# Patient Record
Sex: Female | Born: 1970 | Race: White | Hispanic: No | Marital: Married | Smoking: Former smoker
Health system: Southern US, Community
[De-identification: ages and names within clinical notes are randomized; demographics above are authoritative.]

## PROBLEM LIST (undated history)

## (undated) DIAGNOSIS — F32A Depression, unspecified: Secondary | ICD-10-CM

## (undated) DIAGNOSIS — F329 Major depressive disorder, single episode, unspecified: Secondary | ICD-10-CM

## (undated) DIAGNOSIS — A64 Unspecified sexually transmitted disease: Secondary | ICD-10-CM

## (undated) DIAGNOSIS — N946 Dysmenorrhea, unspecified: Secondary | ICD-10-CM

## (undated) DIAGNOSIS — R87619 Unspecified abnormal cytological findings in specimens from cervix uteri: Secondary | ICD-10-CM

## (undated) DIAGNOSIS — K279 Peptic ulcer, site unspecified, unspecified as acute or chronic, without hemorrhage or perforation: Secondary | ICD-10-CM

## (undated) HISTORY — DX: Depression, unspecified: F32.A

## (undated) HISTORY — DX: Major depressive disorder, single episode, unspecified: F32.9

## (undated) HISTORY — DX: Dysmenorrhea, unspecified: N94.6

## (undated) HISTORY — DX: Unspecified sexually transmitted disease: A64

## (undated) HISTORY — DX: Unspecified abnormal cytological findings in specimens from cervix uteri: R87.619

## (undated) HISTORY — DX: Peptic ulcer, site unspecified, unspecified as acute or chronic, without hemorrhage or perforation: K27.9

---

## 1998-07-16 ENCOUNTER — Other Ambulatory Visit: Admission: RE | Admit: 1998-07-16 | Discharge: 1998-07-16 | Payer: Self-pay | Admitting: *Deleted

## 1998-10-07 ENCOUNTER — Ambulatory Visit (HOSPITAL_COMMUNITY): Admission: RE | Admit: 1998-10-07 | Discharge: 1998-10-07 | Payer: Self-pay | Admitting: *Deleted

## 1999-07-14 ENCOUNTER — Other Ambulatory Visit: Admission: RE | Admit: 1999-07-14 | Discharge: 1999-07-14 | Payer: Self-pay | Admitting: Obstetrics and Gynecology

## 2000-04-10 ENCOUNTER — Emergency Department (HOSPITAL_COMMUNITY): Admission: EM | Admit: 2000-04-10 | Discharge: 2000-04-10 | Payer: Self-pay

## 2000-08-20 ENCOUNTER — Other Ambulatory Visit: Admission: RE | Admit: 2000-08-20 | Discharge: 2000-08-20 | Payer: Self-pay | Admitting: Obstetrics and Gynecology

## 2001-07-20 ENCOUNTER — Emergency Department (HOSPITAL_COMMUNITY): Admission: EM | Admit: 2001-07-20 | Discharge: 2001-07-20 | Payer: Self-pay | Admitting: Emergency Medicine

## 2001-07-20 ENCOUNTER — Encounter: Payer: Self-pay | Admitting: Emergency Medicine

## 2001-07-24 ENCOUNTER — Ambulatory Visit (HOSPITAL_COMMUNITY): Admission: RE | Admit: 2001-07-24 | Discharge: 2001-07-24 | Payer: Self-pay | Admitting: Emergency Medicine

## 2001-07-24 ENCOUNTER — Encounter: Payer: Self-pay | Admitting: Emergency Medicine

## 2001-07-28 ENCOUNTER — Emergency Department (HOSPITAL_COMMUNITY): Admission: EM | Admit: 2001-07-28 | Discharge: 2001-07-28 | Payer: Self-pay | Admitting: *Deleted

## 2001-10-05 ENCOUNTER — Other Ambulatory Visit: Admission: RE | Admit: 2001-10-05 | Discharge: 2001-10-05 | Payer: Self-pay | Admitting: *Deleted

## 2002-10-09 ENCOUNTER — Other Ambulatory Visit: Admission: RE | Admit: 2002-10-09 | Discharge: 2002-10-09 | Payer: Self-pay | Admitting: *Deleted

## 2004-05-08 ENCOUNTER — Other Ambulatory Visit: Admission: RE | Admit: 2004-05-08 | Discharge: 2004-05-08 | Payer: Self-pay | Admitting: *Deleted

## 2005-08-25 ENCOUNTER — Other Ambulatory Visit: Admission: RE | Admit: 2005-08-25 | Discharge: 2005-08-25 | Payer: Self-pay | Admitting: Obstetrics & Gynecology

## 2006-01-12 ENCOUNTER — Other Ambulatory Visit: Admission: RE | Admit: 2006-01-12 | Discharge: 2006-01-12 | Payer: Self-pay | Admitting: Obstetrics & Gynecology

## 2006-03-20 ENCOUNTER — Emergency Department (HOSPITAL_COMMUNITY): Admission: EM | Admit: 2006-03-20 | Discharge: 2006-03-20 | Payer: Self-pay | Admitting: *Deleted

## 2007-03-03 ENCOUNTER — Other Ambulatory Visit: Admission: RE | Admit: 2007-03-03 | Discharge: 2007-03-03 | Payer: Self-pay | Admitting: Obstetrics & Gynecology

## 2008-10-18 ENCOUNTER — Other Ambulatory Visit: Admission: RE | Admit: 2008-10-18 | Discharge: 2008-10-18 | Payer: Self-pay | Admitting: Obstetrics and Gynecology

## 2011-10-19 ENCOUNTER — Ambulatory Visit (INDEPENDENT_AMBULATORY_CARE_PROVIDER_SITE_OTHER): Payer: 59 | Admitting: Physician Assistant

## 2011-10-19 VITALS — BP 115/79 | HR 93 | Temp 98.1°F | Resp 16 | Ht 68.5 in | Wt 186.0 lb

## 2011-10-19 DIAGNOSIS — B349 Viral infection, unspecified: Secondary | ICD-10-CM

## 2011-10-19 DIAGNOSIS — B9789 Other viral agents as the cause of diseases classified elsewhere: Secondary | ICD-10-CM

## 2011-10-19 MED ORDER — IPRATROPIUM BROMIDE 0.06 % NA SOLN: 2.0000 | Freq: Four times a day (QID) | NASAL | Status: AC

## 2011-10-19 NOTE — Progress Notes (Signed)
SUBJECTIVE:  Kristie Baldwin is a 41 y.o. female who complains of congestion, sneezing, sore throat, dry cough, myalgias, headache and fever for 4 days. She denies a history of shortness of breath.  Patient denies smoke cigarettes.   OBJECTIVE: She appears well, vital signs are as noted. Ears normal.  Throat and pharynx normal.  Neck supple. No adenopathy in the neck. Nose is congested. Sinuses non tender. The chest is clear, without wheezes or rales.  ASSESSMENT:  viral upper respiratory illness  PLAN: Symptomatic therapy suggested: Atrovent NS, push fluids, rest, gargle warm salt water, use cough suppressant of choice prn and return office visit prn if symptoms persist or worsen. Lack of antibiotic effectiveness discussed with her. Call or return to clinic  prn if these symptoms worsen or fail to improve as anticipated.

## 2011-10-19 NOTE — Patient Instructions (Addendum)
Make an appointment with a dermatologist of your choice   [facility] [phone]  Dr. Yetta Barre [phone]  Dr. Elmon Else [phone]

## 2012-05-02 ENCOUNTER — Ambulatory Visit (INDEPENDENT_AMBULATORY_CARE_PROVIDER_SITE_OTHER): Payer: 59 | Admitting: Family Medicine

## 2012-05-02 VITALS — BP 122/76 | HR 78 | Temp 98.0°F | Resp 16 | Ht 68.0 in | Wt 205.0 lb

## 2012-05-02 DIAGNOSIS — M7989 Other specified soft tissue disorders: Secondary | ICD-10-CM

## 2012-05-02 NOTE — Progress Notes (Signed)
[facility] [address], [place] [address] [phone]  Date:  05/02/2012   Name:  Kristie Baldwin   DOB:  1971-02-25   MRN:  [id number]  PCP:  No primary provider on file.    Chief Complaint: Foot Swelling and Joint Swelling   History of Present Illness:  Kristie Baldwin is a 41 y.o. very pleasant female patient who presents with the following:  She went to [place] last week- they drove down to [place] and then took a 3 hour flight to [place].  She stayed at an all- inclusive resort and ate and drank much differently than she is used to.  About 48 hours after arrival she noted that both of her feet and lower legs were swollen.  She does not have any calf pain.  She has never been a smoker, does not use OCP, and has never had a DVT or PE.  No SOB or chest pain.  She has noted that her swelling is better over the last 2 days, but she thought that she should have it checked out.  Otherwise she feels fine and is generally healthy  There is no problem list on file for this patient.   No past medical history on file.  No past surgical history on file.  History  Substance Use Topics  . Smoking status: Never Smoker   . Smokeless tobacco: Not on file  . Alcohol Use: Not on file    No family history on file.  No Known Allergies  Medication list has been reviewed and updated.  Current Outpatient Prescriptions on File Prior to Visit  Medication Sig Dispense Refill  . ipratropium (ATROVENT) 0.06 % nasal spray Place 2 sprays into the nose 4 (four) times daily.  15 mL  12    Review of Systems:  As per HPI- otherwise negative.   Physical Examination: Filed Vitals:   05/02/12 1011  BP: 122/76  Pulse: 78  Temp: 98 F (36.7 C)  Resp: 16   Filed Vitals:   05/02/12 1011  Height: 5\' 8"  (1.727 m)  Weight: 205 lb (92.987 kg)   Body mass index is 31.17 kg/(m^2). Ideal Body Weight: Weight in (lb) to have BMI = 25: 164.1   GEN: WDWN, NAD,  Non-toxic, A & O x 3, overweight HEENT: Atraumatic, Normocephalic. Neck supple. No masses, No LAD.  PEERL, EOMI, oropharynx wnl Ears and Nose: No external deformity. CV: RRR, No M/G/R. No JVD. No thrill. No extra heart sounds. PULM: CTA B, no wheezes, crackles, rhonchi. No retractions. No resp. distress. No accessory muscle use. EXTR: No c/c.  Very minimal edema of both feet, no pitting, no calf swelling or tenderness. NEURO Normal gait.  PSYCH: Normally interactive. Conversant. Not depressed or anxious appearing.  Calm demeanor.    Assessment and Plan: 1. Foot swelling    Suspect that Kristie Baldwin developed leg edema due to a long car ride/ flight and then dietary indiscretion.  She is now better, and has minimal swelling.  She does not have calf pain or swelling, and her symptoms are present bilaterally which makes a DVT much more likely.  Discussed a doppler but Kristie Baldwin declined this for now. She will drink plenty of fluids, elevate her legs and watch her salt intake.  If she is not continuing to improve please let me know- Sooner if worse.     Abbe Amsterdam, MD

## 2012-08-29 LAB — HM PAP SMEAR

## 2012-12-09 ENCOUNTER — Telehealth: Payer: Self-pay | Admitting: Nurse Practitioner

## 2012-12-09 NOTE — Telephone Encounter (Signed)
Pt canceled appointment for labs Pt states she stopped taking the medication months ago so this lab is not needed.

## 2012-12-12 ENCOUNTER — Other Ambulatory Visit: Payer: 59

## 2013-03-08 ENCOUNTER — Telehealth: Payer: Self-pay | Admitting: Nurse Practitioner

## 2013-03-08 NOTE — Telephone Encounter (Signed)
Patient says she has "something weird going on" and wants to talk to a nurse.

## 2013-03-08 NOTE — Telephone Encounter (Signed)
Patient states went camping last week and had to use a different soap. She is now with severe vaginal itching , red, swollen in area.  Suggested OTC hydrocortisone ointment for discomfort along with sitz bath with Aveeno and let air dry . Patient given appt. With Shirlyn Goltz , Thursday, 03/09/2013 @ 10:15am.

## 2013-03-09 ENCOUNTER — Ambulatory Visit (INDEPENDENT_AMBULATORY_CARE_PROVIDER_SITE_OTHER): Payer: BC Managed Care – PPO | Admitting: Nurse Practitioner

## 2013-03-09 ENCOUNTER — Encounter: Payer: Self-pay | Admitting: Nurse Practitioner

## 2013-03-09 VITALS — BP 120/72 | HR 74 | Resp 12 | Ht 67.75 in | Wt 201.8 lb

## 2013-03-09 DIAGNOSIS — N766 Ulceration of vulva: Secondary | ICD-10-CM

## 2013-03-09 MED ORDER — VALACYCLOVIR HCL 1 G PO TABS: 1000.0000 mg | ORAL_TABLET | Freq: Two times a day (BID) | ORAL | Status: DC

## 2013-03-09 MED ORDER — FLUCONAZOLE 150 MG PO TABS: 150.0000 mg | ORAL_TABLET | Freq: Once | ORAL | Status: DC

## 2013-03-09 NOTE — Progress Notes (Signed)
Encounter reviewed by Dr. Brook Silva.  

## 2013-03-09 NOTE — Patient Instructions (Addendum)
Monilial Vaginitis Vaginitis in a soreness, swelling and redness (inflammation) of the vagina and vulva. Monilial vaginitis is not a sexually transmitted infection. CAUSES  Yeast vaginitis is caused by yeast (candida) that is normally found in your vagina. With a yeast infection, the candida has overgrown in number to a point that upsets the chemical balance. SYMPTOMS   White, thick vaginal discharge.  Swelling, itching, redness and irritation of the vagina and possibly the lips of the vagina (vulva).  Burning or painful urination.  Painful intercourse. DIAGNOSIS  Things that may contribute to monilial vaginitis are:  Postmenopausal and virginal states.  Pregnancy.  Infections.  Being tired, sick or stressed, especially if you had monilial vaginitis in the past.  Diabetes. Good control will help lower the chance.  Birth control pills.  Tight fitting garments.  Using bubble bath, feminine sprays, douches or deodorant tampons.  Taking certain medications that kill germs (antibiotics).  Sporadic recurrence can occur if you become ill. TREATMENT  Your caregiver will give you medication.  There are several kinds of anti monilial vaginal creams and suppositories specific for monilial vaginitis. For recurrent yeast infections, use a suppository or cream in the vagina 2 times a week, or as directed.  Anti-monilial or steroid cream for the itching or irritation of the vulva may also be used. Get your caregiver's permission.  Painting the vagina with methylene blue solution may help if the monilial cream does not work.  Eating yogurt may help prevent monilial vaginitis. HOME CARE INSTRUCTIONS   Finish all medication as prescribed.  Do not have sex until treatment is completed or after your caregiver tells you it is okay.  Take warm sitz baths.  Do not douche.  Do not use tampons, especially scented ones.  Wear cotton underwear.  Avoid tight pants and panty  hose.  Tell your sexual partner that you have a yeast infection. They should go to their caregiver if they have symptoms such as mild rash or itching.  Your sexual partner should be treated as well if your infection is difficult to eliminate.  Practice safer sex. Use condoms.  Some vaginal medications cause latex condoms to fail. Vaginal medications that harm condoms are:  Cleocin cream.  Butoconazole (Femstat).  Terconazole (Terazol) vaginal suppository.  Miconazole (Monistat) (may be purchased over the counter). SEEK MEDICAL CARE IF:   You have a temperature by mouth above 102 F (38.9 C).  The infection is getting worse after 2 days of treatment.  The infection is not getting better after 3 days of treatment.  You develop blisters in or around your vagina.  You develop vaginal bleeding, and it is not your menstrual period.  You have pain when you urinate.  You develop intestinal problems.  You have pain with sexual intercourse. Document Released: 05/06/2005 Document Revised: 10/19/2011 Document Reviewed: 01/18/2009 [facility] Patient Information 2014 [place], [place].    Herpes Labialis You have a fever blister or cold sore (herpes labialis). These painful, grouped sores are caused by one of the herpes viruses (HSV1 most commonly). They are usually found around the lips and mouth, but the same infection can also affect other areas on the face such as the nose and eyes. Herpes infections take about 10 days to heal. They often occur again and again in the same spot. Other symptoms may include numbness and tingling in the involved skin, achiness, fever, and swollen glands in the neck. Colds, emotional stress, injuries, or excess sunlight exposure all seem to make herpes reappear.  Herpes lip infections are contagious. Direct contact with these sores can spread the infection. It can also be spread to other parts of your own body. TREATMENT  Herpes labialis is usually  self-limited and resolves within 1 week. To reduce pain and swelling, apply ice packs frequently to the sores or suck on popsicles or frozen juice bars. Antiviral medicine may be used by mouth to shorten the duration of the breakout. Avoid spreading the infection by washing your hands often. Be careful not to touch your eyes or genital areas after handling the infected blisters. Do not kiss or have other intimate contact with others. After the blisters are completely healed you may resume contact. Use sunscreen to lessen recurrences.  If this is your first infection with herpes, or if you have a severe or repeated infections, your caregiver may prescribe one of the anti-viral drugs to speed up the healing. If you have sun-related flare-ups despite the use of sunscreen, starting oral anti-viral medicine before a prolonged exposure (going skiing or to the beach) can prevent most episodes.  SEEK IMMEDIATE MEDICAL CARE IF:  You develop a headache, sleepiness, high fever, vomiting, or severe weakness.  You have eye irritation, pain, blurred vision or redness.  You develop a prolonged infection not getting better in 10 days. Document Released: 07/27/2005 Document Revised: 10/19/2011 Document Reviewed: 05/31/2009 [facility] Patient Information 2014 [place], [place].

## 2013-03-09 NOTE — Progress Notes (Signed)
Subjective:     Patient ID: Kristie Baldwin, female   DOB: 15-Oct-1970, 42 y.o.   MRN: [id number]  42 yo WS Fe presents with vaginal irritation since Monday. New soap with Lavender scented and right away thought she got a yeast infection or local reaction.  Also recent camping trip for 5 days and unable to bathe very well. No bladder symptoms.  Most symptoms are external with swelling, itching and burning. Last SA 2 weeks ago without change in partner. They still are planning wedding in March 2015. No history of HSV.  Vaginal Pain The patient's pertinent negatives include no pelvic pain or vaginal discharge. Pertinent negatives include no abdominal pain, chills, constipation, diarrhea, dysuria, fever, flank pain, frequency, hematuria, nausea, urgency or vomiting.      Review of Systems  Constitutional: Negative for fever, chills and fatigue.  Respiratory: Negative.   Cardiovascular: Negative.   Gastrointestinal: Negative.  Negative for nausea, vomiting, abdominal pain, diarrhea, constipation, blood in stool and abdominal distention.  Genitourinary: Positive for vaginal pain. Negative for dysuria, urgency, frequency, hematuria, flank pain, vaginal bleeding, vaginal discharge, difficulty urinating, pelvic pain and dyspareunia.       LMP 7/43/14 heavier and longer than usual lasting 6 days. Condoms for birth control.  History of hydradenitis in groin areas.    Musculoskeletal: Negative.   Neurological: Negative.   Psychiatric/Behavioral: Negative.        A lot of emotional stressors with loss of job and sending out resume'.       Objective:   Physical Exam  Constitutional: She is oriented to person, place, and time. She appears well-developed and well-nourished.  Pulmonary/Chest: Effort normal.  Abdominal: Soft. She exhibits no distension and no mass. There is no tenderness. There is no rebound and no guarding.  Genitourinary:     Right vulva lesion ? HSV or scratching.  HSV culture is  obtained.  Several other areas peri anal with redness and irritation consistent with yeast. No vaginal discharge.  Neurological: She is alert and oriented to person, place, and time.  Psychiatric: She has a normal mood and affect. Her behavior is normal. Judgment and thought content normal.       Assessment:     Vulvar lesions R/O HSV doubtful Yeast vulva changes consistent with change of soaps and maybe allergic reaction.    Plan:    Valtrex 1 gm bid for 10 days Diflucan 150 mg X 2  Will call her with culture results

## 2013-03-13 LAB — HERPES SIMPLEX VIRUS CULTURE: Organism ID, Bacteria: DETECTED

## 2013-03-14 ENCOUNTER — Telehealth: Payer: Self-pay | Admitting: Nurse Practitioner

## 2013-03-14 NOTE — Telephone Encounter (Signed)
Called and left message for patient to call me back with the best number to discuss her lab test results this afternoon.

## 2013-03-14 NOTE — Telephone Encounter (Signed)
Patient returning your call for test results. °

## 2013-03-15 ENCOUNTER — Telehealth: Payer: Self-pay | Admitting: Nurse Practitioner

## 2013-03-15 NOTE — Telephone Encounter (Signed)
Left patient another message to call back about test results.

## 2013-03-16 ENCOUNTER — Telehealth: Payer: Self-pay | Admitting: Nurse Practitioner

## 2013-03-16 NOTE — Telephone Encounter (Signed)
Patient return phone call. She said Kristie Baldwin had called her last night and they have missed each others phone call for two days now.

## 2013-03-16 NOTE — Telephone Encounter (Signed)
Patient states she is at home all day today and will be able to receive Shirlyn Goltz call.

## 2013-03-16 NOTE — Telephone Encounter (Signed)
Patients first husband in 1990's had an episode of vaginal problems.  No positive culture in the past.  She believes this is an old infection but never culture proven.  Her symptoms of the outbreak are better but plans on completed antiviral treatment.  Some white discharge and will take second Diflucan within a few days.

## 2013-06-15 ENCOUNTER — Other Ambulatory Visit: Payer: Self-pay

## 2013-07-03 ENCOUNTER — Telehealth: Payer: Self-pay | Admitting: Nurse Practitioner

## 2013-07-03 MED ORDER — VALACYCLOVIR HCL 1 G PO TABS: ORAL_TABLET | ORAL | Status: DC

## 2013-07-03 NOTE — Telephone Encounter (Signed)
Pt would like a Rx sent to pharmacy for the herpes medication that she was prescribed in July.  [facility] [address] at [phone].

## 2013-07-03 NOTE — Telephone Encounter (Signed)
Ms. Kristie Baldwin patient was seen 03/09/13 for OV Valtrx #[id number] was sent.  Please advise.

## 2013-07-05 MED ORDER — VALACYCLOVIR HCL 1 G PO TABS: ORAL_TABLET | ORAL | Status: DC

## 2013-07-05 NOTE — Telephone Encounter (Signed)
Dr. Hyacinth Meeker please advise routed this to Ms.Patty no response.

## 2013-07-05 NOTE — Addendum Note (Signed)
Addended by: Jerene Bears on: 07/05/2013 01:27 PM   Modules accepted: Orders

## 2013-07-05 NOTE — Telephone Encounter (Signed)
LM that rx has been sent

## 2013-07-05 NOTE — Telephone Encounter (Signed)
Rx to pharmacy.  Please let pt know.

## 2013-08-31 ENCOUNTER — Encounter: Payer: Self-pay | Admitting: Nurse Practitioner

## 2013-09-01 ENCOUNTER — Ambulatory Visit (INDEPENDENT_AMBULATORY_CARE_PROVIDER_SITE_OTHER): Payer: BC Managed Care – PPO | Admitting: Nurse Practitioner

## 2013-09-01 ENCOUNTER — Encounter: Payer: Self-pay | Admitting: Nurse Practitioner

## 2013-09-01 VITALS — BP 112/66 | HR 76 | Ht 67.5 in | Wt 203.0 lb

## 2013-09-01 DIAGNOSIS — Z01419 Encounter for gynecological examination (general) (routine) without abnormal findings: Secondary | ICD-10-CM

## 2013-09-01 DIAGNOSIS — Z0000 Encounter for general adult medical examination without abnormal findings: Secondary | ICD-10-CM

## 2013-09-01 DIAGNOSIS — B009 Herpesviral infection, unspecified: Secondary | ICD-10-CM

## 2013-09-01 DIAGNOSIS — A609 Anogenital herpesviral infection, unspecified: Secondary | ICD-10-CM

## 2013-09-01 DIAGNOSIS — L732 Hidradenitis suppurativa: Secondary | ICD-10-CM

## 2013-09-01 LAB — POCT URINALYSIS DIPSTICK
Bilirubin, UA: NEGATIVE
Blood, UA: NEGATIVE
Glucose, UA: NEGATIVE
Ketones, UA: NEGATIVE
Leukocytes, UA: NEGATIVE
Nitrite, UA: NEGATIVE
Protein, UA: NEGATIVE
Urobilinogen, UA: NEGATIVE
pH, UA: 5

## 2013-09-01 LAB — HEMOGLOBIN, FINGERSTICK: Hemoglobin, fingerstick: 12.3 g/dL (ref 12.0–16.0)

## 2013-09-01 MED ORDER — CEPHALEXIN 500 MG PO CAPS: 500.0000 mg | ORAL_CAPSULE | Freq: Two times a day (BID) | ORAL | Status: DC

## 2013-09-01 MED ORDER — NITROFURANTOIN MONOHYD MACRO 100 MG PO CAPS: ORAL_CAPSULE | ORAL | Status: DC

## 2013-09-01 MED ORDER — VALACYCLOVIR HCL 1 G PO TABS: ORAL_TABLET | ORAL | Status: DC

## 2013-09-01 NOTE — Progress Notes (Signed)
Patient ID: Kristie Baldwin, female   DOB: [birth year], 43 y.o.   MRN: [id number] 43 y.o. [id number] Divorced Caucasian Fe here for annual exam. To be married 10/28/13.  Same partner for 4 years.  She has had a stressful year with being out of work.  Now a new job to start in Feb. Also one dog passed away and another dog is older and expected to pass. Menses at 27 - 29 days, lasting 3 days. Flow is moderate to light. Some cramps avery 2-3 months.  More emotional symptoms. Using condoms prn for birth control. Takes Valtrex as prn this past year if flare.  Today she noted the area on right mons pubis being more tender and starting to have minimal discharge.  Patient's last menstrual period was 08/14/2013.          Sexually active: yes  The current method of family planning is condoms sometimes.    Exercising: no  The patient does not participate in regular exercise at present. Smoker:  no  Health Maintenance: Pap:  08/29/12, WNL MMG:  Never will schedule after new job is started. TDaP:  08/27/11 Labs:  HB:  12.3 Urine:  Negative, pH 5.0   reports that she quit smoking about 22 years ago. She has never used smokeless tobacco. She reports that she drinks about 2.0 ounces of alcohol per week. She reports that she does not use illicit drugs.  Past Medical History  Diagnosis Date  . Depression   . STD (sexually transmitted disease)     HSV  . Dysmenorrhea     History reviewed. No pertinent past surgical history.  Current Outpatient Prescriptions  Medication Sig Dispense Refill  . valACYclovir (VALTREX) 1000 MG tablet Take 1/2 tablet daily and if flare 1/2 tablet twice daily for 3 days  30 tablet  6  . cephALEXin (KEFLEX) 500 MG capsule Take 1 capsule (500 mg total) by mouth 2 (two) times daily. Take QID for 7 days.  14 capsule  0  . nitrofurantoin, macrocrystal-monohydrate, (MACROBID) 100 MG capsule 1 cap PC prn  30 capsule  3   No current facility-administered medications for this visit.     Family History  Problem Relation Age of Onset  . Hypertension Mother     ROS:  Pertinent items are noted in HPI.  Otherwise, a comprehensive ROS was negative.  Exam:   BP 112/66  Pulse 76  Ht 5' 7.5" (1.715 m)  Wt 203 lb (92.08 kg)  BMI 31.31 kg/m2  LMP 08/14/2013 Height: 5' 7.5" (171.5 cm)  Ht Readings from Last 3 Encounters:  09/01/13 5' 7.5" (1.715 m)  03/09/13 5' 7.75" (1.721 m)  05/02/12 5\' 8"  (1.727 m)    General appearance: alert, cooperative and appears stated age Head: Normocephalic, without obvious abnormality, atraumatic Neck: no adenopathy, supple, symmetrical, trachea midline and thyroid normal to inspection and palpation Lungs: clear to auscultation bilaterally Breasts: normal appearance, no masses or tenderness Heart: regular rate and rhythm Abdomen: soft, non-tender; no masses,  no organomegaly Extremities: extremities normal, atraumatic, no cyanosis or edema Skin: Skin color, texture, turgor normal. No rashes or lesions Lymph nodes: Cervical, supraclavicular, and axillary nodes normal. No abnormal inguinal nodes palpated Neurologic: Grossly normal   Pelvic: External genitalia:   Lesion with cyst recurrent area right  Mons pubis.              Urethra:  normal appearing urethra with no masses, tenderness or lesions  Bartholin's and Skene's: normal                 Vagina: normal appearing vagina with normal color and discharge, no lesions              Cervix: anteverted              Pap taken: no Bimanual Exam:  Uterus:  normal size, contour, position, consistency, mobility, non-tender              Adnexa: no mass, fullness, tenderness               Rectovaginal: Confirms               Anus:  normal sphincter tone, no lesions  A:  Well Woman with normal exam  History of HSV  History of PC UTI  History of hydradenitis   P:   Pap smear as per guidelines   Mammogram to be scheduled after new job starts  Refill Macrobid to use prn  PC  Refill Keflex 500 mg in case current area of cyst worsens  Does not need a refill at this time on Valtrex.  Counseled on breast self exam, mammography screening, adequate intake of calcium and vitamin D, diet and exercise return annually or prn  An After Visit Summary was printed and given to the patient.

## 2013-09-01 NOTE — Patient Instructions (Signed)

## 2013-09-06 NOTE — Progress Notes (Signed)
Reviewed personally.  M. Suzanne Li Bobo, MD.  

## 2013-11-13 ENCOUNTER — Ambulatory Visit: Payer: BC Managed Care – PPO

## 2013-11-13 ENCOUNTER — Ambulatory Visit (INDEPENDENT_AMBULATORY_CARE_PROVIDER_SITE_OTHER): Payer: BC Managed Care – PPO | Admitting: Family Medicine

## 2013-11-13 VITALS — BP 104/82 | HR 89 | Temp 98.4°F | Resp 18 | Ht 68.0 in | Wt 203.0 lb

## 2013-11-13 DIAGNOSIS — R0602 Shortness of breath: Secondary | ICD-10-CM

## 2013-11-13 DIAGNOSIS — K219 Gastro-esophageal reflux disease without esophagitis: Secondary | ICD-10-CM

## 2013-11-13 DIAGNOSIS — R5381 Other malaise: Secondary | ICD-10-CM

## 2013-11-13 DIAGNOSIS — R5383 Other fatigue: Secondary | ICD-10-CM

## 2013-11-13 DIAGNOSIS — D649 Anemia, unspecified: Secondary | ICD-10-CM

## 2013-11-13 LAB — POCT CBC
Granulocyte percent: 74 %G (ref 37–80)
HCT, POC: 34.2 % — AB (ref 37.7–47.9)
Hemoglobin: 10.6 g/dL — AB (ref 12.2–16.2)
Lymph, poc: 2.2 (ref 0.6–3.4)
MCH, POC: 27 pg (ref 27–31.2)
MCHC: 31 g/dL — AB (ref 31.8–35.4)
MCV: 87.3 fL (ref 80–97)
MID (cbc): 0.6 (ref 0–0.9)
MPV: 9.3 fL (ref 0–99.8)
POC Granulocyte: 7.7 — AB (ref 2–6.9)
POC LYMPH PERCENT: 20.7 %L (ref 10–50)
POC MID %: 5.3 %M (ref 0–12)
Platelet Count, POC: 285 10*3/uL (ref 142–424)
RBC: 3.92 M/uL — AB (ref 4.04–5.48)
RDW, POC: 12.8 %
WBC: 10.4 10*3/uL — AB (ref 4.6–10.2)

## 2013-11-13 NOTE — Progress Notes (Signed)
Chief Complaint:  Chief Complaint  Patient presents with  . Shortness of Breath    1 week, when laying down at night  . Generalized Body Aches    HPI: Kristie Baldwin  ( suggs) is a 43 y.o. female who is here for 1 week history of chest tightness, when she lays down, feels like she is out of breath all the time. She also has had a lot of burping.  No URI sxs, She is not on birth control. No calf pain, swelling. No recent travels, no risk factors for blood clots. No rashes, has had fatigue for one day, Has had palpitations. No leg swelling. She has SOB when she walks across the room or up and down stairs for 1 week.  She denies sleep apnea, deneis poor oral intake. She has not ahd any insect/tick bites. She sleeps well.  She LMP march 22, she denies having clots or heavy cycles   Past Medical History  Diagnosis Date  . Depression   . STD (sexually transmitted disease)     HSV  . Dysmenorrhea    History reviewed. No pertinent past surgical history. History   Social History  . Marital Status: Married    Spouse Name: N/A    Number of Children: 0  . Years of Education: N/A   Occupational History  .     Social History Main Topics  . Smoking status: Former Smoker -- 0 years    Quit date: 08/11/1991  . Smokeless tobacco: Never Used  . Alcohol Use: 2.0 oz/week    4 drink(s) per week  . Drug Use: No  . Sexual Activity: Yes    Partners: Male    Birth Control/ Protection: Condom   Other Topics Concern  . None   Social History Narrative  . None   Family History  Problem Relation Age of Onset  . Hypertension Mother    No Known Allergies Prior to Admission medications   Medication Sig Start Date End Date Taking? Authorizing Provider  cephALEXin (KEFLEX) 500 MG capsule Take 1 capsule (500 mg total) by mouth 2 (two) times daily. Take QID for 7 days. 09/01/13  Yes Patricia Rolen-Grubb, FNP  nitrofurantoin, macrocrystal-monohydrate, (MACROBID) 100 MG capsule 1 cap  PC prn 09/01/13  Yes Patricia Rolen-Grubb, FNP  valACYclovir (VALTREX) 1000 MG tablet Take 1/2 tablet daily and if flare 1/2 tablet twice daily for 3 days 09/01/13   Lauro Franklin, FNP     ROS: The patient denies fevers, chills, night sweats, unintentional weight loss,  wheezing, dyspnea on exertion, nausea, vomiting, abdominal pain, dysuria, hematuria, melena, numbness,  or tingling.   All other systems have been reviewed and were otherwise negative with the exception of those mentioned in the HPI and as above.    PHYSICAL EXAM: Filed Vitals:   11/13/13 2002  BP: 104/82  Pulse: 89  Temp: 98.4 F (36.9 C)  Resp: 18  Spo2 99% Filed Vitals:   11/13/13 2002  Height: 5\' 8"  (1.727 m)  Weight: 203 lb (92.08 kg)   Body mass index is 30.87 kg/(m^2).  General: Alert, no acute distress HEENT:  Normocephalic, atraumatic, oropharynx patent. EOMI, PERRLA, no thyor Cardiovascular:  Regular rate and rhythm, no rubs murmurs or gallops.  No Carotid bruits, radial pulse intact. No pedal edema.  Respiratory: Clear to auscultation bilaterally.  No wheezes, rales, or rhonchi.  No cyanosis, no use of accessory musculature GI: No organomegaly, abdomen is soft and non-tender, positive  bowel sounds.  No masses. Skin: No rashes. Neurologic: Facial musculature symmetric. Psychiatric: Patient is appropriate throughout our interaction. Lymphatic: No cervical lymphadenopathy Musculoskeletal: Gait intact.   LABS: Results for orders placed in visit on 11/13/13  POCT CBC      Result Value Ref Range   WBC 10.4 (*) 4.6 - 10.2 K/uL   Lymph, poc 2.2  0.6 - 3.4   POC LYMPH PERCENT 20.7  10 - 50 %L   MID (cbc) 0.6  0 - 0.9   POC MID % 5.3  0 - 12 %M   POC Granulocyte 7.7 (*) 2 - 6.9   Granulocyte percent 74.0  37 - 80 %G   RBC 3.92 (*) 4.04 - 5.48 M/uL   Hemoglobin 10.6 (*) 12.2 - 16.2 g/dL   HCT, POC 40.934.2 (*) 81.137.7 - 47.9 %   MCV 87.3  80 - 97 fL   MCH, POC 27.0  27 - 31.2 pg   MCHC 31.0 (*) 31.8  - 35.4 g/dL   RDW, POC 91.412.8     Platelet Count, POC 285  142 - 424 K/uL   MPV 9.3  0 - 99.8 fL     EKG/XRAY:   [facility]. Neg for infiltrate, pneumo or effusion   ASSESSMENT/PLAN: Encounter Diagnoses  Name Primary?  . SOB (shortness of breath) Yes  . Other malaise and fatigue   . GERD (gastroesophageal reflux disease)   . Anemia    CBC shows anemai, CXR was normal Iron studies pending CMP , TSH pending No urinary sxs so will not get UA Take otc prilosec F/u prn  Gross sideeffects, risk and benefits, and alternatives of medications d/w patient. Patient is aware that all medications have potential sideeffects and we are unable to predict every sideeffect or drug-drug interaction that may occur.  Travarus Trudo PHUONG, DO 11/13/2013 10:00 PM

## 2013-11-13 NOTE — Patient Instructions (Signed)

## 2013-11-14 LAB — IRON AND TIBC
%SAT: 10 % — ABNORMAL LOW (ref 20–55)
Iron: 32 ug/dL — ABNORMAL LOW (ref 42–145)
TIBC: 336 ug/dL (ref 250–470)
UIBC: 304 ug/dL (ref 125–400)

## 2013-11-14 LAB — COMPREHENSIVE METABOLIC PANEL
ALT: 25 U/L (ref 0–35)
AST: 26 U/L (ref 0–37)
Albumin: 4.4 g/dL (ref 3.5–5.2)
Alkaline Phosphatase: 62 U/L (ref 39–117)
BUN: 9 mg/dL (ref 6–23)
CO2: 24 mEq/L (ref 19–32)
Calcium: 9.3 mg/dL (ref 8.4–10.5)
Chloride: 106 mEq/L (ref 96–112)
Creat: 1.02 mg/dL (ref 0.50–1.10)
Glucose, Bld: 94 mg/dL (ref 70–99)
Potassium: 4.4 mEq/L (ref 3.5–5.3)
Sodium: 140 mEq/L (ref 135–145)
Total Bilirubin: 0.2 mg/dL (ref 0.2–1.2)
Total Protein: 7.3 g/dL (ref 6.0–8.3)

## 2013-11-14 LAB — TSH: TSH: 1.99 u[IU]/mL (ref 0.350–4.500)

## 2013-11-14 LAB — FERRITIN: Ferritin: 110 ng/mL (ref 10–291)

## 2013-11-16 ENCOUNTER — Telehealth: Payer: Self-pay | Admitting: Family Medicine

## 2013-11-16 NOTE — Telephone Encounter (Signed)
Lm about labs, iron def anemia, TSH, CMP normal. Advise to take iron supplements 325 mg BID x 2 months, then return for ov to recheck iron.

## 2014-01-26 ENCOUNTER — Telehealth: Payer: Self-pay | Admitting: Nurse Practitioner

## 2014-01-26 NOTE — Telephone Encounter (Signed)
Patient has had two cycles each month for the past two months

## 2014-01-26 NOTE — Telephone Encounter (Signed)
Spoke with patient. Patient states that last month she had her menses for 4-5 days and then had two days without and then it began again for 4-5 days. This months menses started on 6/4 and lsted 4-5 days then off a week and now she started again today. Patient is not currently on any form of birth control. Advised patient it is best for her to come in for evaluation with Kristie FranklinPatricia Rolen-Grubb, FNP to discuss irregular menses and for her to recommend what would be best for the patient. Patient is agreeable. Appointment scheduled for Monday 6/22 at 2:15pm with Kristie FranklinPatricia Rolen-Grubb, FNP. Patient agreeable to date and time.  Routing to provider for final review. Patient agreeable to disposition. Will close encounter

## 2014-01-29 ENCOUNTER — Encounter: Payer: Self-pay | Admitting: Nurse Practitioner

## 2014-01-29 ENCOUNTER — Ambulatory Visit (INDEPENDENT_AMBULATORY_CARE_PROVIDER_SITE_OTHER): Payer: BC Managed Care – PPO | Admitting: Nurse Practitioner

## 2014-01-29 VITALS — BP 100/66 | HR 76 | Ht 68.0 in | Wt 198.0 lb

## 2014-01-29 DIAGNOSIS — R5383 Other fatigue: Secondary | ICD-10-CM

## 2014-01-29 DIAGNOSIS — R5381 Other malaise: Secondary | ICD-10-CM

## 2014-01-29 DIAGNOSIS — N926 Irregular menstruation, unspecified: Secondary | ICD-10-CM

## 2014-01-29 DIAGNOSIS — D508 Other iron deficiency anemias: Secondary | ICD-10-CM

## 2014-01-29 LAB — CBC WITH DIFFERENTIAL/PLATELET
Basophils Absolute: 0 10*3/uL (ref 0.0–0.1)
Basophils Relative: 0 % (ref 0–1)
Eosinophils Absolute: 0.1 10*3/uL (ref 0.0–0.7)
Eosinophils Relative: 1 % (ref 0–5)
HCT: 39.3 % (ref 36.0–46.0)
Hemoglobin: 13 g/dL (ref 12.0–15.0)
Lymphocytes Relative: 18 % (ref 12–46)
Lymphs Abs: 2.2 10*3/uL (ref 0.7–4.0)
MCH: 27.7 pg (ref 26.0–34.0)
MCHC: 33.1 g/dL (ref 30.0–36.0)
MCV: 83.8 fL (ref 78.0–100.0)
Monocytes Absolute: 0.9 10*3/uL (ref 0.1–1.0)
Monocytes Relative: 7 % (ref 3–12)
Neutro Abs: 9.1 10*3/uL — ABNORMAL HIGH (ref 1.7–7.7)
Neutrophils Relative %: 74 % (ref 43–77)
Platelets: 397 10*3/uL (ref 150–400)
RBC: 4.69 MIL/uL (ref 3.87–5.11)
RDW: 14.5 % (ref 11.5–15.5)
WBC: 12.3 10*3/uL — ABNORMAL HIGH (ref 4.0–10.5)

## 2014-01-29 LAB — POCT URINALYSIS DIPSTICK
Bilirubin, UA: NEGATIVE
Glucose, UA: NEGATIVE
Ketones, UA: NEGATIVE
Leukocytes, UA: NEGATIVE
Nitrite, UA: NEGATIVE
Protein, UA: NEGATIVE
Urobilinogen, UA: NEGATIVE
pH, UA: 5

## 2014-01-29 LAB — POCT URINE PREGNANCY: Preg Test, Ur: NEGATIVE

## 2014-01-29 NOTE — Progress Notes (Signed)
Patient ID: Kristie Baldwin, female   DOB: 23-Apr-1971, 43 y.o.   MRN: [id number]  This 43 yo G1P0010 MW Fe complains of irregular menses for the past several months.  She is using no method of birth control, but states since she got married this spring not SA very often at all.  Other health concerns with plantar fascitis, corneal abrasion, and another viral illness in April.  She went to [facility] with fatigue and  palpitations.  Labs from there shows anemia, but normal CMP and TSH. Husband also had some health issues in the interim. Other stressors is a new job even though she enjoys the new position.  Menses calendar:  April - 17-20 May 13-15 & 18- 22 (on 5/13 cortisone injections for plantar fascitis)  June 4 -8 (had a corneal abrasion - treated with anti inflammatory and steroid eye drops) June 18 -21 (cortisone injectio for plantar fascitis)  ROS: as noted most recently a viral infection and anemia was diagnosed.  Since then has felt better and energy level has increased.  Denies URI and UTI symptoms.  She would like to get urine checked for infection.   Urine chemstrip: trace leuk's UPT: negative  Assessment:1.   Irregular Menses    2.   Peri menopausal vs. Cortisone med's   3.   Recent diagnoses of anemia that needs recheck   4.   No method of birth control  Plan:  1. UPT is negative   2.  Will get repeat CBC and follow   3.  Will get urine C&S and follow   4. Continue to monitor menses record   5. Encouraged condoms use -(she feels she has a fertility issues).   Consult time: 15 minutes face to face

## 2014-01-30 LAB — URINE CULTURE
Colony Count: NO GROWTH
Organism ID, Bacteria: NO GROWTH

## 2014-01-30 LAB — URINALYSIS, MICROSCOPIC ONLY
Bacteria, UA: NONE SEEN
Casts: NONE SEEN
Crystals: NONE SEEN
Squamous Epithelial / LPF: NONE SEEN

## 2014-01-31 ENCOUNTER — Encounter: Payer: Self-pay | Admitting: Nurse Practitioner

## 2014-02-05 NOTE — Progress Notes (Signed)
Encounter reviewed by Dr. Brook Silva.  

## 2014-03-28 ENCOUNTER — Telehealth: Payer: Self-pay | Admitting: Nurse Practitioner

## 2014-03-28 DIAGNOSIS — D72829 Elevated white blood cell count, unspecified: Secondary | ICD-10-CM

## 2014-03-28 NOTE — Telephone Encounter (Signed)
Patient says she was told to call and schedule a CBC.(no order).

## 2014-03-28 NOTE — Telephone Encounter (Signed)
OK for labs for tomorrow.

## 2014-03-28 NOTE — Telephone Encounter (Signed)
Spoke with patient. Patient is calling to schedule CBC appointment as instructed per Lauro FranklinPatricia Rolen-Grubb, FNP in [facility] message. Patient states that her last steroid shot was on June 19th which has now been two months. Would like to schedule. Appointment scheduled for tomorrow at 3:30pm. Order placed.   Aldean Bakerngela S Suggs - last viewed at 2:26 PM on 03/28/2014 Message Body The steroids I made reference to is the injections you had for your foot. If any more injections are not planned then wait 4 weeks and get a repeat CBC. Our aim is to see a normal white count as this one may be elevated secondary to the steroids they gave you in the foot. As far as the irregular menses - that again should go back to being normal after all the steroids are out of your system. Keep a menses calendar and if still abnormal in 2 months to call back. It was good to see that your hemoglobin was normal and not anemic. I hope this clarifies my earlier e-mail - but if not let me know. Thanks. Shirlyn GoltzPatty Grubb    Dr.Silva, okay to keep scheduled for tomorrow as Lauro FranklinPatricia Rolen-Grubb, FNP recommended patient to wait four weeks? Message was sent today but patient states last shot was already 2 months ago. Last CBC was on 6/22.

## 2014-03-29 ENCOUNTER — Other Ambulatory Visit (INDEPENDENT_AMBULATORY_CARE_PROVIDER_SITE_OTHER): Payer: BC Managed Care – PPO

## 2014-03-29 DIAGNOSIS — D72829 Elevated white blood cell count, unspecified: Secondary | ICD-10-CM

## 2014-03-29 LAB — CBC WITH DIFFERENTIAL/PLATELET
Basophils Absolute: 0.1 10*3/uL (ref 0.0–0.1)
Basophils Relative: 1 % (ref 0–1)
Eosinophils Absolute: 0.2 10*3/uL (ref 0.0–0.7)
Eosinophils Relative: 2 % (ref 0–5)
HCT: 39.1 % (ref 36.0–46.0)
Hemoglobin: 12.7 g/dL (ref 12.0–15.0)
Lymphocytes Relative: 19 % (ref 12–46)
Lymphs Abs: 1.9 10*3/uL (ref 0.7–4.0)
MCH: 27.7 pg (ref 26.0–34.0)
MCHC: 32.5 g/dL (ref 30.0–36.0)
MCV: 85.4 fL (ref 78.0–100.0)
Monocytes Absolute: 0.6 10*3/uL (ref 0.1–1.0)
Monocytes Relative: 6 % (ref 3–12)
Neutro Abs: 7.2 10*3/uL (ref 1.7–7.7)
Neutrophils Relative %: 72 % (ref 43–77)
Platelets: 365 10*3/uL (ref 150–400)
RBC: 4.58 MIL/uL (ref 3.87–5.11)
RDW: 13.4 % (ref 11.5–15.5)
WBC: 10 10*3/uL (ref 4.0–10.5)

## 2014-05-08 ENCOUNTER — Other Ambulatory Visit: Payer: Self-pay

## 2014-05-08 DIAGNOSIS — Z1231 Encounter for screening mammogram for malignant neoplasm of breast: Secondary | ICD-10-CM

## 2014-05-09 ENCOUNTER — Ambulatory Visit
Admission: RE | Admit: 2014-05-09 | Discharge: 2014-05-09 | Disposition: A | Discharge status: Home / Self Care | Payer: BC Managed Care – PPO | Source: Ambulatory Visit

## 2014-05-09 DIAGNOSIS — Z1231 Encounter for screening mammogram for malignant neoplasm of breast: Secondary | ICD-10-CM

## 2014-06-11 ENCOUNTER — Encounter: Payer: Self-pay | Admitting: Nurse Practitioner

## 2014-08-17 ENCOUNTER — Ambulatory Visit (INDEPENDENT_AMBULATORY_CARE_PROVIDER_SITE_OTHER): Payer: BLUE CROSS/BLUE SHIELD | Admitting: Family Medicine

## 2014-08-17 VITALS — BP 114/80 | HR 94 | Temp 97.7°F | Resp 16 | Ht 68.0 in | Wt 211.0 lb

## 2014-08-17 DIAGNOSIS — J3489 Other specified disorders of nose and nasal sinuses: Secondary | ICD-10-CM

## 2014-08-17 DIAGNOSIS — R059 Cough, unspecified: Secondary | ICD-10-CM

## 2014-08-17 DIAGNOSIS — R067 Sneezing: Secondary | ICD-10-CM

## 2014-08-17 DIAGNOSIS — N926 Irregular menstruation, unspecified: Secondary | ICD-10-CM

## 2014-08-17 DIAGNOSIS — R05 Cough: Secondary | ICD-10-CM

## 2014-08-17 DIAGNOSIS — N910 Primary amenorrhea: Secondary | ICD-10-CM

## 2014-08-17 DIAGNOSIS — E663 Overweight: Secondary | ICD-10-CM

## 2014-08-17 LAB — POCT URINE PREGNANCY: Preg Test, Ur: NEGATIVE

## 2014-08-17 MED ORDER — FLUTICASONE PROPIONATE 50 MCG/ACT NA SUSP: 2.0000 | Freq: Every day | NASAL | Status: DC

## 2014-08-17 MED ORDER — HYDROCODONE-HOMATROPINE 5-1.5 MG/5ML PO SYRP: 5.0000 mL | ORAL_SOLUTION | Freq: Three times a day (TID) | ORAL | Status: DC | PRN

## 2014-08-17 NOTE — Patient Instructions (Signed)
Use the nasal spray as needed for your nasal symptoms. You can also continue to use tylenol as needed.  Use the cough syrup as needed but remember it will make you sleepy so do not use it when you need to drive.  Let me know if you do not feel better in the next few days- Sooner if worse.

## 2014-08-17 NOTE — Progress Notes (Signed)
[facility] [address] [place] [address] [phone]  Date:  08/17/2014   Name:  Kristie Baldwin   DOB:  January 26, 1971   MRN:  [id number]  PCP:  No PCP Per Patient    Chief Complaint: URI   History of Present Illness:  Kristie Baldwin is a 44 y.o. very pleasant female patient who presents with the following:  She is here today with congestion, coughing and sneezing for about one week. This has gone on for about a week.   She notes "massive sneezing, head burning, coughing till my body is sore." She has not noted a fever but has noted aches at night some of the time No GI symptoms  So far she has not tried any OTC medications except for tylenol.   She does not generally tolerate [place] well LMP November- her menses have spaced out over the last year or so.  Neither she nor her husband have been sterilized.  She does not suspect pregnancy but is ok with doing a test   There are no active problems to display for this patient.   Past Medical History  Diagnosis Date  . Depression   . STD (sexually transmitted disease)     HSV  . Dysmenorrhea     History reviewed. No pertinent past surgical history.  History  Substance Use Topics  . Smoking status: Former Smoker -- 0 years    Quit date: 08/11/1991  . Smokeless tobacco: Never Used  . Alcohol Use: 2.0 oz/week    4 drink(s) per week    Family History  Problem Relation Age of Onset  . Hypertension Mother     No Known Allergies  Medication list has been reviewed and updated.  Current Outpatient Prescriptions on File Prior to Visit  Medication Sig Dispense Refill  . valACYclovir (VALTREX) 1000 MG tablet Take 1/2 tablet daily and if flare 1/2 tablet twice daily for 3 days (Patient not taking: Reported on 08/17/2014) 30 tablet 6   No current facility-administered medications on file prior to visit.    Review of Systems:  As per HPI- otherwise negative.   Physical Examination: Filed  Vitals:   08/17/14 0932  BP: 114/80  Pulse: 94  Temp: 97.7 F (36.5 C)  Resp: 16   Filed Vitals:   08/17/14 0932  Height:  (1.727 m)  Weight: 211 lb (95.709 kg)   Body mass index is 32.09 kg/(m^2). Ideal Body Weight: Weight in (lb) to have BMI = 25: 164.1  GEN: WDWN, NAD, Non-toxic, A & O x 3, overweight, looks well HEENT: Atraumatic, Normocephalic. Neck supple. No masses, No LAD.  Bilateral TM wnl, oropharynx normal.  PEERL,EOMI.   Nasal congestion Ears and Nose: No external deformity. CV: RRR, No M/G/R. No JVD. No thrill. No extra heart sounds. PULM: CTA B, no wheezes, crackles, rhonchi. No retractions. No resp. distress. No accessory muscle use. EXTR: No c/c/e NEURO Normal gait.  PSYCH: Normally interactive. Conversant. Not depressed or anxious appearing.  Calm demeanor.   Results for orders placed or performed in visit on 08/17/14  POCT urine pregnancy  Result Value Ref Range   Preg Test, Ur Negative      Assessment and Plan: Cough - Plan: HYDROcodone-homatropine (HYCODAN) 5-1.5 MG/5ML syrup  Late menses - Plan: POCT urine pregnancy  Rhinorrhea - Plan: fluticasone (FLONASE) 50 MCG/ACT nasal spray  Sneezing - Plan: fluticasone (FLONASE) 50 MCG/ACT nasal spray   Treat for the sx of  a viral URI as above.  She will follow-up if not better soon Cautioned regarding sedation with cough syrup  Signed Abbe AmsterdamJessica Copland, MD

## 2014-09-10 ENCOUNTER — Ambulatory Visit (INDEPENDENT_AMBULATORY_CARE_PROVIDER_SITE_OTHER): Payer: BLUE CROSS/BLUE SHIELD | Admitting: Nurse Practitioner

## 2014-09-10 ENCOUNTER — Encounter: Payer: Self-pay | Admitting: Nurse Practitioner

## 2014-09-10 VITALS — BP 108/72 | HR 88 | Ht 68.0 in | Wt 211.0 lb

## 2014-09-10 DIAGNOSIS — Z01419 Encounter for gynecological examination (general) (routine) without abnormal findings: Secondary | ICD-10-CM

## 2014-09-10 DIAGNOSIS — Z0000 Encounter for general adult medical examination without abnormal findings: Secondary | ICD-10-CM

## 2014-09-10 LAB — COMPREHENSIVE METABOLIC PANEL
ALT: 16 U/L (ref 0–35)
AST: 18 U/L (ref 0–37)
Albumin: 4 g/dL (ref 3.5–5.2)
Alkaline Phosphatase: 65 U/L (ref 39–117)
BUN: 11 mg/dL (ref 6–23)
CO2: 26 mEq/L (ref 19–32)
Calcium: 9.1 mg/dL (ref 8.4–10.5)
Chloride: 103 mEq/L (ref 96–112)
Creat: 1.09 mg/dL (ref 0.50–1.10)
Glucose, Bld: 92 mg/dL (ref 70–99)
Potassium: 4.4 mEq/L (ref 3.5–5.3)
Sodium: 139 mEq/L (ref 135–145)
Total Bilirubin: 0.4 mg/dL (ref 0.2–1.2)
Total Protein: 6.8 g/dL (ref 6.0–8.3)

## 2014-09-10 LAB — POCT URINALYSIS DIPSTICK
Bilirubin, UA: NEGATIVE
Glucose, UA: NEGATIVE
Ketones, UA: NEGATIVE
Leukocytes, UA: NEGATIVE
Nitrite, UA: NEGATIVE
Protein, UA: NEGATIVE
Urobilinogen, UA: NEGATIVE
pH, UA: 5

## 2014-09-10 LAB — LIPID PANEL
Cholesterol: 167 mg/dL (ref 0–200)
HDL: 40 mg/dL (ref >39)
LDL Cholesterol: 102 mg/dL — ABNORMAL HIGH (ref 0–99)
Total CHOL/HDL Ratio: 4.2 Ratio
Triglycerides: 123 mg/dL (ref <150)
VLDL: 25 mg/dL (ref 0–40)

## 2014-09-10 MED ORDER — VALACYCLOVIR HCL 1 G PO TABS: ORAL_TABLET | ORAL | Status: DC

## 2014-09-10 NOTE — Patient Instructions (Signed)

## 2014-09-10 NOTE — Progress Notes (Signed)
44 y.o. 851P0010 Married  Caucasian Fe here for annual exam.  Got married last October 29, 2014.  PMP 06/30/14 then amenorrhea until 09/08/14.  In December usual PMS, cramps, and increase in vaso symptoms.  Menses now at usual 24 days, last one at about 65 days.  Patient's last menstrual period was 09/08/2014 (exact date).          Sexually active: Yes.    The current method of family planning is none.    Exercising: No.  The patient does not participate in regular exercise at present. Smoker:  no  Health Maintenance: Pap:  08/29/12, negative  MMG:  05/09/14, Bi-Rads 1:  Negative  TDaP:  08/27/11 Labs:  HB:  12.6  Urine:  Mod RBC (menses)   reports that she quit smoking about 23 years ago. She has never used smokeless tobacco. She reports that she drinks about 2.0 oz of alcohol per week. She reports that she does not use illicit drugs.  Past Medical History  Diagnosis Date  . Depression   . STD (sexually transmitted disease)     HSV  . Dysmenorrhea     History reviewed. No pertinent past surgical history.  Current Outpatient Prescriptions  Medication Sig Dispense Refill  . valACYclovir (VALTREX) 1000 MG tablet Take 1/2 tablet daily and if flare 1/2 tablet twice daily for 3 days 30 tablet 6   No current facility-administered medications for this visit.    Family History  Problem Relation Age of Onset  . Hypertension Mother     ROS:  Pertinent items are noted in HPI.  Otherwise, a comprehensive ROS was negative.  Exam:   BP 108/72 mmHg  Pulse 88  Ht 5\' 8"  (1.727 m)  Wt 211 lb (95.709 kg)  BMI 32.09 kg/m2  LMP 09/08/2014 (Exact Date) Height: 5\' 8"  (172.7 cm) Ht Readings from Last 3 Encounters:  09/10/14 5\' 8"  (1.727 m)  08/17/14 5\' 8"  (1.727 m)  01/29/14 5\' 8"  (1.727 m)    General appearance: alert, cooperative and appears stated age Head: Normocephalic, without obvious abnormality, atraumatic Neck: no adenopathy, supple, symmetrical, trachea midline and thyroid normal to  inspection and palpation Lungs: clear to auscultation bilaterally Breasts: normal appearance, no masses or tenderness Heart: regular rate and rhythm Abdomen: soft, non-tender; no masses,  no organomegaly Extremities: extremities normal, atraumatic, no cyanosis or edema Skin: Skin color, texture, turgor normal. No rashes or lesions Lymph nodes: Cervical, supraclavicular, and axillary nodes normal. No abnormal inguinal nodes palpated Neurologic: Grossly normal   Pelvic: External genitalia:  no lesions              Urethra:  normal appearing urethra with no masses, tenderness or lesions              Bartholin's and Skene's: normal                 Vagina: normal appearing vagina with normal color and discharge, no lesions              Cervix: anteverted              Pap taken: No. Bimanual Exam:  Uterus:  normal size, contour, position, consistency, mobility, non-tender              Adnexa: no mass, fullness, tenderness               Rectovaginal: Confirms               Anus:  normal sphincter tone, no lesions  Chaperone present:  no  A:  Well Woman with normal exam  Perimenopausal - C/W irregular menses  History of HSV History of PC UTI History of hydradenitis - no current flare   P:   Reviewed health and wellness pertinent to exam  Pap smear taken today  Mammogram is due 9/15  Refill on Valtrex for daly use and prn  Counseled on breast self exam, mammography screening, adequate intake of calcium and vitamin D, diet and exercise return annually or prn  An After Visit Summary was printed and given to the patient.

## 2014-09-11 LAB — TSH: TSH: 1.752 u[IU]/mL (ref 0.350–4.500)

## 2014-09-11 LAB — VITAMIN D 25 HYDROXY (VIT D DEFICIENCY, FRACTURES): Vit D, 25-Hydroxy: 20 ng/mL — ABNORMAL LOW (ref 30–100)

## 2014-09-11 LAB — HEMOGLOBIN, FINGERSTICK: Hemoglobin, fingerstick: 12.6 g/dL (ref 12.0–16.0)

## 2014-09-16 NOTE — Progress Notes (Signed)
Encounter reviewed by Dr. Conley SimmondsBrook Silva. Mammogram due September 2016.

## 2015-03-13 ENCOUNTER — Telehealth: Payer: Self-pay | Admitting: Emergency Medicine

## 2015-03-13 ENCOUNTER — Encounter: Payer: Self-pay | Admitting: Nurse Practitioner

## 2015-03-13 MED ORDER — CEPHALEXIN 500 MG PO CAPS: ORAL_CAPSULE | ORAL | Status: DC

## 2015-03-13 NOTE — Telephone Encounter (Signed)
Ok for her to have a refill of Keflex.  She has had this problem many times before.

## 2015-03-13 NOTE — Telephone Encounter (Signed)
Patient sent mychart message.  History of hidradenitis. Last prescription for Keflex given by Lauro Franklin, FNP on 09/01/13 office visit.   Spoke with patient. She reports for 4 days she has had a cyst area on mons pubis (same area as last time in 2015). She reports the area is dime sized and is draining fluid. She states this is the same as it always has done, but states she usually had a prescription on hand at home. Denies fevers or chills. Declines office visit and requests telephone treatment by provider if possible. Advised would review with Patty and return call. Patient agreeable.

## 2015-03-13 NOTE — Telephone Encounter (Signed)
Called patient and message from provider given. Patient advised if any concerns or symptoms worsen to please call office and schedule office visit. She is agreeable and verbalized understanding of instructions.  Routing to provider for final review. Patient agreeable to disposition. Will close encounter.

## 2015-03-13 NOTE — Telephone Encounter (Signed)
Telephone call for triage created to discuss message with patient and disposition as appropriate.   

## 2015-03-13 NOTE — Telephone Encounter (Signed)
Chief Complaint  Patient presents with  . Advice Only    Patient sent mychart message for medication request     ===View-only below this line===   ----- Message -----    From: Kristie Baldwin    Sent: 03/13/2015  9:43 AM EDT      To: Kristie Franklin, FNP Subject: Non-Urgent Medical Question  Good morning!  Do you remember when I use to get a prescription for Keflex?  I don't think I got one last time I was there earlier this year.  Since I sit all day now again and it is so humid in our office - we all have fans blowing on [place] - I have one of those places that has come up - like I had issues with before.  I have looked online and I don't see where I have a refill or an in case of emergency prescription with [facility].  Can you help?  (And I have legally changed my name to Kristie Baldwin / for your records)  PS - hope you are having a great summer!  Thanks! Kristie Baldwin

## 2015-06-28 ENCOUNTER — Other Ambulatory Visit: Payer: Self-pay

## 2015-06-28 DIAGNOSIS — Z1231 Encounter for screening mammogram for malignant neoplasm of breast: Secondary | ICD-10-CM

## 2015-07-17 ENCOUNTER — Ambulatory Visit
Admission: RE | Admit: 2015-07-17 | Discharge: 2015-07-17 | Disposition: A | Discharge status: Home / Self Care | Payer: BLUE CROSS/BLUE SHIELD | Source: Ambulatory Visit

## 2015-07-17 DIAGNOSIS — Z1231 Encounter for screening mammogram for malignant neoplasm of breast: Secondary | ICD-10-CM

## 2015-09-16 ENCOUNTER — Ambulatory Visit (INDEPENDENT_AMBULATORY_CARE_PROVIDER_SITE_OTHER): Payer: BLUE CROSS/BLUE SHIELD | Admitting: Nurse Practitioner

## 2015-09-16 ENCOUNTER — Encounter: Payer: Self-pay | Admitting: Nurse Practitioner

## 2015-09-16 VITALS — BP 100/70 | HR 76 | Ht 67.75 in | Wt 209.0 lb

## 2015-09-16 DIAGNOSIS — N926 Irregular menstruation, unspecified: Secondary | ICD-10-CM | POA: Diagnosis not present

## 2015-09-16 DIAGNOSIS — Z0000 Encounter for general adult medical examination without abnormal findings: Secondary | ICD-10-CM

## 2015-09-16 DIAGNOSIS — N760 Acute vaginitis: Secondary | ICD-10-CM | POA: Diagnosis not present

## 2015-09-16 DIAGNOSIS — Z01419 Encounter for gynecological examination (general) (routine) without abnormal findings: Secondary | ICD-10-CM

## 2015-09-16 LAB — POCT URINALYSIS DIPSTICK
Bilirubin, UA: NEGATIVE
Blood, UA: NEGATIVE
Glucose, UA: NEGATIVE
Ketones, UA: NEGATIVE
Leukocytes, UA: NEGATIVE
Nitrite, UA: NEGATIVE
Protein, UA: NEGATIVE
Urobilinogen, UA: NEGATIVE
pH, UA: 6

## 2015-09-16 LAB — COMPREHENSIVE METABOLIC PANEL
ALT: 22 U/L (ref 6–29)
AST: 18 U/L (ref 10–35)
Albumin: 4 g/dL (ref 3.6–5.1)
Alkaline Phosphatase: 56 U/L (ref 33–115)
BUN: 11 mg/dL (ref 7–25)
CO2: 27 mmol/L (ref 20–31)
Calcium: 9.1 mg/dL (ref 8.6–10.2)
Chloride: 104 mmol/L (ref 98–110)
Creat: 1.06 mg/dL (ref 0.50–1.10)
Glucose, Bld: 85 mg/dL (ref 65–99)
Potassium: 4.1 mmol/L (ref 3.5–5.3)
Sodium: 137 mmol/L (ref 135–146)
Total Bilirubin: 0.5 mg/dL (ref 0.2–1.2)
Total Protein: 7.1 g/dL (ref 6.1–8.1)

## 2015-09-16 LAB — LIPID PANEL
Cholesterol: 166 mg/dL (ref 125–200)
HDL: 45 mg/dL — ABNORMAL LOW (ref >=46)
LDL Cholesterol: 107 mg/dL (ref <130)
Total CHOL/HDL Ratio: 3.7 Ratio (ref <=5.0)
Triglycerides: 71 mg/dL (ref <150)
VLDL: 14 mg/dL (ref <30)

## 2015-09-16 LAB — VITAMIN D 25 HYDROXY (VIT D DEFICIENCY, FRACTURES): Vit D, 25-Hydroxy: 16 ng/mL — ABNORMAL LOW (ref 30–100)

## 2015-09-16 LAB — TSH: TSH: 1.5 mIU/L

## 2015-09-16 LAB — HEMOGLOBIN, FINGERSTICK: Hemoglobin, fingerstick: 12.7 g/dL (ref 12.0–16.0)

## 2015-09-16 MED ORDER — VALACYCLOVIR HCL 1 G PO TABS: ORAL_TABLET | ORAL | Status: DC

## 2015-09-16 NOTE — Patient Instructions (Signed)

## 2015-09-16 NOTE — Progress Notes (Signed)
Encounter reviewed by Dr. Brook Amundson C. Silva.  

## 2015-09-16 NOTE — Progress Notes (Signed)
Patient ID: Kristie Baldwin, female   DOB: 12-17-1970, 45 y.o.   MRN: [id number]  45 y.o. G88P0010 Married  Caucasian Fe here for annual exam.  Menses this year with more regularity.  Then last week at mid cycle, had every PMS symptoms with bloating and cramps but no cycle.  These symptoms lasted for about 4 days.  She did have a vaginal odor without discharge since then.  She also is concerned about decreased libido, not SA for a year.  No increase in stressors, no marital issues.   Patient's last menstrual period was 08/23/2015 (exact date).          Sexually active: Yes.    The current method of family planning is condoms most of the time.    Exercising: No.  The patient does not participate in regular exercise at present. Smoker:  Former smoker, quit in 1993   Health Maintenance: Pap: 08/29/12, Negative  MMG: 07/17/15, Bi-Rads 1: Negative  TDaP: 08/27/11 HIV: done Labs: HB: 12.7  Urine: Negative    reports that she quit smoking about 24 years ago. She has never used smokeless tobacco. She reports that she drinks about 2.0 oz of alcohol per week. She reports that she does not use illicit drugs.  Past Medical History  Diagnosis Date  . Depression   . STD (sexually transmitted disease)     HSV  . Dysmenorrhea     History reviewed. No pertinent past surgical history.  Current Outpatient Prescriptions  Medication Sig Dispense Refill  . valACYclovir (VALTREX) 1000 MG tablet Take 1/2 tablet daily and if flare 1/2 tablet twice daily for 3 days 90 tablet 3   No current facility-administered medications for this visit.    Family History  Problem Relation Age of Onset  . Hypertension Mother     ROS:  Pertinent items are noted in HPI.  Otherwise, a comprehensive ROS was negative.  Exam:   BP 100/70 mmHg  Pulse 76  Ht 5' 7.75" (1.721 m)  Wt 209 lb (94.802 kg)  BMI 32.01 kg/m2  LMP 08/23/2015 (Exact Date) Height: 5' 7.75" (172.1 cm) Ht Readings from Last 3 Encounters:   09/16/15 5' 7.75" (1.721 m)  09/10/14  (1.727 m)  08/17/14  (1.727 m)    General appearance: alert, cooperative and appears stated age Head: Normocephalic, without obvious abnormality, atraumatic Neck: no adenopathy, supple, symmetrical, trachea midline and thyroid normal to inspection and palpation Lungs: clear to auscultation bilaterally Breasts: normal appearance, no masses or tenderness Heart: regular rate and rhythm Abdomen: soft, non-tender; no masses,  no organomegaly Extremities: extremities normal, atraumatic, no cyanosis or edema Skin: Skin color, texture, turgor normal. No rashes or lesions Lymph nodes: Cervical, supraclavicular, and axillary nodes normal. No abnormal inguinal nodes palpated Neurologic: Grossly normal   Pelvic: External genitalia:  no lesions              Urethra:  normal appearing urethra with no masses, tenderness or lesions              Bartholin's and Skene's: normal                 Vagina: normal appearing vagina with normal color and discharge, no lesions              Cervix: anteverted              Pap taken: Yes.   Bimanual Exam:  Uterus:  normal size, contour, position, consistency, mobility, non-tender  Adnexa: no mass, fullness, tenderness               Rectovaginal: Confirms               Anus:  normal sphincter tone, no lesions  Chaperone present: no  A:  Well Woman with normal exam  Perimenopausal - C/W irregular menses History of HSV - takes Valtrex prn History of PC UTI - no current flare History of hydradenitis - no current flare - will call if flares  Vaginal odor   P:   Reviewed health and wellness pertinent to exam  Pap smear as above  Mammogram is due 12/17  Follow with labs and Wet Prep  Refill on Valtrex  Counseled on breast self exam, mammography screening, adequate intake of calcium and vitamin D, diet and exercise return annually or prn  An After Visit  Summary was printed and given to the patient.

## 2015-09-17 ENCOUNTER — Telehealth: Payer: Self-pay | Admitting: *Deleted

## 2015-09-17 ENCOUNTER — Other Ambulatory Visit: Payer: Self-pay | Admitting: Nurse Practitioner

## 2015-09-17 DIAGNOSIS — E559 Vitamin D deficiency, unspecified: Secondary | ICD-10-CM

## 2015-09-17 LAB — WET PREP BY MOLECULAR PROBE
Candida species: NEGATIVE
Gardnerella vaginalis: POSITIVE — AB
Trichomonas vaginosis: NEGATIVE

## 2015-09-17 MED ORDER — FLUCONAZOLE 150 MG PO TABS: 150.0000 mg | ORAL_TABLET | Freq: Once | ORAL | Status: DC

## 2015-09-17 MED ORDER — METRONIDAZOLE 0.75 % VA GEL: 1.0000 | Freq: Every day | VAGINAL | Status: DC

## 2015-09-17 MED ORDER — VITAMIN D (ERGOCALCIFEROL) 1.25 MG (50000 UNIT) PO CAPS: 50000.0000 [IU] | ORAL_CAPSULE | ORAL | Status: DC

## 2015-09-17 NOTE — Telephone Encounter (Deleted)
Pt notified of AFFIRM results via [phone] (Mobile) per DPR.  Pt advised in message to call with any questions and instructions on taking medications.  Incorrect patient.  Note deleted.

## 2015-09-17 NOTE — Telephone Encounter (Signed)
-----   Message from [name], FNP sent at 09/17/2015  8:45 AM EST ----- Addendum note:  Marylene Land,  For a Vit D recheck lab -just call for lab apt for 3 months.  Do not have to be fasting.  Orders are already placed in your chart -- this is very important to make sure you are responding to med's.

## 2015-09-18 LAB — IPS PAP TEST WITH HPV

## 2015-09-26 NOTE — Telephone Encounter (Signed)
Results reviewed via MyChart on 09/17/15 at 9:34am. Closing encounter.

## 2015-10-15 ENCOUNTER — Encounter: Payer: Self-pay | Admitting: Nurse Practitioner

## 2015-10-15 ENCOUNTER — Telehealth: Payer: Self-pay

## 2015-10-15 NOTE — Telephone Encounter (Signed)
Visit Follow-Up Question  Message [phone]   From  Marla Roengela Suggs [place]   To  Ria CommentPatricia Grubb, FNP   Sent  10/15/2015 3:17 PM     Good afternoon,  I have a question - I was prescribed RX Vit D 50,000 IU weekly on my last visit. I am getting ready to take my 5th dose tomorrow. I do not feel any more energy - still tired all of the time but I do have an increase in appetite - which is something I don't need. Is there normal?   Thanks!  Kristie Baldwin      Responsible Party    Pool - [facility] Pool No one has taken responsibility for this message.     No actions have been taken on this message.     Patient was started on Vitamin D 50,000 IU take 1 tablet every 7 days on 09/17/2015. Vitamin D level on 09/16/2015 was 16. Routing to Ria CommentPatricia Grubb, FNP for review of mychart encounter sent by patient.

## 2015-10-15 NOTE — Telephone Encounter (Signed)
Telephone encounter created to discuss mychart message with [name], FNP. 

## 2015-10-16 NOTE — Telephone Encounter (Signed)
I have looked back over her labs - thyroid can also cause fatigue but she was very normal.  Is she sleeping well?  Does she have other aches, pains, or worries that may be causing fatigue.  Does she feel depressed??

## 2015-10-17 NOTE — Telephone Encounter (Signed)
Left message to call Kaitlyn at [phone]. 

## 2015-10-17 NOTE — Telephone Encounter (Signed)
Spoke with patient. OV scheduled with Ria CommentPatricia Grubb, FNP for 10/18/2015 at 3 pm. She is agreeable to date and time.  Routing to provider for final review. Patient agreeable to disposition. Will close encounter.

## 2015-10-17 NOTE — Telephone Encounter (Signed)
I think it is nutrition related. She is eating granola bar which has sugar and some protein. This is metabolized out fairly quickly, which will increase fatigue and the feeling on needing to eat. Should start day with good protein intake,limited concentrated carbohydrates. Trying also eating a snack of dried nuts or tablespoon of peanut butter mid morning and a good protein and fresh vegetable lunch. Increase water intake. See if this helps and she can let us know.

## 2015-10-17 NOTE — Telephone Encounter (Signed)
I would just schedule OV

## 2015-10-17 NOTE — Telephone Encounter (Signed)
Spoke with patient. Patient states that she is sleeping very well at night. Sleeps every night for 8 hours. Denies interrupted sleep. Reports after 8 hours of sleep she wakes up still very tired. Denies any aches or pains. Reports "Everything in life is really good." Denies any recent change in daily exercise. Denies being stressed, anxious, or depressed. States that her appetite has increased over the last couple of weeks. "I usually eat a granola bar for breakfast and then do not eat again until 1. Now I am really hungry when it gets to 11 in the morning." Advised patient I will speak with covering provider regarding recommendations and return call. She is agreeable.  Routing to Leota Sauerseborah Leonard CNM for review as Ria CommentPatricia Grubb, FNP is out of the office today.

## 2015-10-17 NOTE — Telephone Encounter (Signed)
Spoke with patient. Advised of message as seen below from PepsiCoDeborah Baldwin CNM. States she eats plenty of protein per day. "All I ever drink is water other than one cup of coffee in the morning. I never eat candy or much sugar and I do not believe in eating frozen food or fast food. I feel I eat healthy overall." States she does not eat protein in the morning for breakfast. Advised eating protein for breakfast can help to keep her full longer and not feel as fatigued. States "I could do better with that, but I have never eaten that way. I just don't think this is related to my diet. I feel like a zombie. I thought starting the Vitamin D would help." Advised I will speak with the provider regarding additional recommendations. She is agreeable. Patient states she is agreeable to come in for OV and labs if needed with Ria CommentPatricia Grubb, FNP.  Leota Sauerseborah Baldwin CNM any additional recommendations at this time or need to proceed with scheduling OV with Ria CommentPatricia Grubb, FNP?

## 2015-10-18 ENCOUNTER — Encounter: Payer: Self-pay | Admitting: Nurse Practitioner

## 2015-10-18 ENCOUNTER — Ambulatory Visit (INDEPENDENT_AMBULATORY_CARE_PROVIDER_SITE_OTHER): Payer: BLUE CROSS/BLUE SHIELD | Admitting: Nurse Practitioner

## 2015-10-18 VITALS — BP 104/66 | HR 64 | Ht 67.75 in | Wt 209.0 lb

## 2015-10-18 DIAGNOSIS — E559 Vitamin D deficiency, unspecified: Secondary | ICD-10-CM

## 2015-10-18 DIAGNOSIS — R5383 Other fatigue: Secondary | ICD-10-CM | POA: Diagnosis not present

## 2015-10-18 LAB — COMPREHENSIVE METABOLIC PANEL
ALT: 17 U/L (ref 6–29)
AST: 16 U/L (ref 10–35)
Albumin: 4 g/dL (ref 3.6–5.1)
Alkaline Phosphatase: 63 U/L (ref 33–115)
BUN: 12 mg/dL (ref 7–25)
CO2: 27 mmol/L (ref 20–31)
Calcium: 9.4 mg/dL (ref 8.6–10.2)
Chloride: 103 mmol/L (ref 98–110)
Creat: 1 mg/dL (ref 0.50–1.10)
Glucose, Bld: 90 mg/dL (ref 65–99)
Potassium: 4.6 mmol/L (ref 3.5–5.3)
Sodium: 137 mmol/L (ref 135–146)
Total Bilirubin: 0.3 mg/dL (ref 0.2–1.2)
Total Protein: 7.1 g/dL (ref 6.1–8.1)

## 2015-10-18 NOTE — Progress Notes (Signed)
Patient ID: Kristie Baldwin, female   DOB: 05/21/1971, 45 y.o.   MRN: [id number] S:   This 45 yo WM Female G1P0010 comes in today for a consult.  She feels that something is "wrong".  For the past 2 yrs she has been on a diet with reduction in sugar and carb's.  She has been on the stationary bike and went as much as 18 miles 3 times a week.  Still has not felt well and no weight loss.  When here for her AEX in 09/16/15 she had some of the same concerns.  Her TSH was normal but her Vit D level was low.  She felt this may the cause of just feeling tired.  Started on RX Vit D 50,000 IU weekly on 2/8 until 3/1.  With each dose of medication she felt worse with fatigue, mental confusion, decrease in energy, fatigue, increase in joint and muscle pain, increased appetite.  Denies constipation.  [place] and does not remember the trip.  Felt in a dazed condition and symptoms were noted by co workers.  Sleep quality was OK.  No heart palpitations.  A comparison of both TSH from 2016 & 2017 was both normal range.  Vit D  Last year was at 1020 and this year at 6216.  Did not take any Vit D last year.  Her mother has had some thyroid issues and she thinks she also has some.  Does not want to accept this as her 'new normal'.  Since off RX Vit D she does feel the mental fog is better.  A: History of Vit D deficiency  History of Vit D intolerance with mental confusion  FMH of thyroid problems  Plan:  Will recheck some labs  Will need to discuss with Dr. Edward JollySilva any other recommendations  Consult time:  15 minutes.

## 2015-10-19 LAB — CBC WITH DIFFERENTIAL/PLATELET
Basophils Absolute: 0.1 10*3/uL (ref 0.0–0.1)
Basophils Relative: 1 % (ref 0–1)
Eosinophils Absolute: 0.2 10*3/uL (ref 0.0–0.7)
Eosinophils Relative: 2 % (ref 0–5)
HCT: 38.4 % (ref 36.0–46.0)
Hemoglobin: 12.7 g/dL (ref 12.0–15.0)
Lymphocytes Relative: 24 % (ref 12–46)
Lymphs Abs: 2.5 10*3/uL (ref 0.7–4.0)
MCH: 27.9 pg (ref 26.0–34.0)
MCHC: 33.1 g/dL (ref 30.0–36.0)
MCV: 84.4 fL (ref 78.0–100.0)
MPV: 9.5 fL (ref 8.6–12.4)
Monocytes Absolute: 0.8 10*3/uL (ref 0.1–1.0)
Monocytes Relative: 8 % (ref 3–12)
Neutro Abs: 6.9 10*3/uL (ref 1.7–7.7)
Neutrophils Relative %: 65 % (ref 43–77)
Platelets: 335 10*3/uL (ref 150–400)
RBC: 4.55 MIL/uL (ref 3.87–5.11)
RDW: 13.4 % (ref 11.5–15.5)
WBC: 10.6 10*3/uL — ABNORMAL HIGH (ref 4.0–10.5)

## 2015-10-19 LAB — VITAMIN D 25 HYDROXY (VIT D DEFICIENCY, FRACTURES): Vit D, 25-Hydroxy: 28 ng/mL — ABNORMAL LOW (ref 30–100)

## 2015-10-19 LAB — THYROID PANEL WITH TSH
Free Thyroxine Index: 2.2 (ref 1.4–3.8)
T3 Uptake: 33 % (ref 22–35)
T4, Total: 6.8 ug/dL (ref 4.5–12.0)
TSH: 1.37 mIU/L

## 2015-10-20 NOTE — Progress Notes (Signed)
Encounter reviewed by Dr. Janean SarkBrook Amundson C. Silva. I recommend patient see her PCP for further evaluation.

## 2015-11-15 DIAGNOSIS — H10212 Acute toxic conjunctivitis, left eye: Secondary | ICD-10-CM | POA: Diagnosis not present

## 2015-11-15 DIAGNOSIS — T2612XA Burn of cornea and conjunctival sac, left eye, initial encounter: Secondary | ICD-10-CM | POA: Diagnosis not present

## 2016-04-14 ENCOUNTER — Ambulatory Visit: Payer: BLUE CROSS/BLUE SHIELD | Admitting: Family Medicine

## 2016-04-14 VITALS — BP 120/84 | HR 98 | Temp 98.0°F | Resp 16 | Ht 68.0 in | Wt 210.6 lb

## 2016-04-14 DIAGNOSIS — J029 Acute pharyngitis, unspecified: Secondary | ICD-10-CM

## 2016-04-14 LAB — POCT RAPID STREP A (OFFICE): Rapid Strep A Screen: NEGATIVE

## 2016-04-14 MED ORDER — GUAIFENESIN ER 1200 MG PO TB12: 1.0000 | ORAL_TABLET | Freq: Two times a day (BID) | ORAL | 1 refills | Status: DC | PRN

## 2016-04-14 MED ORDER — BENZONATATE 100 MG PO CAPS: 100.0000 mg | ORAL_CAPSULE | Freq: Three times a day (TID) | ORAL | 0 refills | Status: DC | PRN

## 2016-04-14 NOTE — Patient Instructions (Addendum)
Symptoms are likely viral and will resolve with symptom treatment.  Follow-up as needed. . . benzonatate (TESSALON) 100 MG capsule    Sig: Take 1-2 capsules (100-200 mg total) by mouth 3 (three) times daily as needed for cough.  . Guaifenesin (MUCINEX MAXIMUM STRENGTH) 1200 MG TB12    Sig: Take 1 tablet (1,200 mg total) by mouth every 12 (twelve) hours as needed.    Godfrey PickKimberly S. Tiburcio PeaHarris, MSN, FNP-C [facility]    IF you received an x-ray today, you will receive an invoice from [facility]. Please contact [facility] at [phone] with questions or concerns regarding your invoice.   IF you received labwork today, you will receive an invoice from [facility]. Please contact [facility] at [phone] with questions or concerns regarding your invoice.   Our billing staff will not be able to assist you with questions regarding bills from these companies.  You will be contacted with the lab results as soon as they are available. The fastest way to get your results is to activate your My Chart account. Instructions are located on the last page of this paperwork. If you have not heard from us regarding the results in 2 weeks, please contact this office.

## 2016-04-14 NOTE — Progress Notes (Signed)
   Patient ID: Kristie Baldwin, female    DOB: 12/24/1970, 45 y.o.   MRN: [id number]  PCP: No PCP Per Patient  Chief Complaint  Patient presents with  . Sore Throat    x 1week  . Cough    x 1 week    Subjective:   HPI 45 year-old presents for evaluation of cough and sore throat times 1 week..  Patient reports experiencing cold sweats over the last 3 days.  She denies measurable fever, wheezing, or shortness of breath. Intermittent frontal headache. Non-productive cough and fatigue. People within her office at work have experienced similar symptoms.  . Social History   Social History  . Marital status: Married    Spouse name: N/A  . Number of children: 0  . Years of education: N/A   Occupational History  .  [employer]   Social History Main Topics  . Smoking status: Former Smoker    Years: 0.00    Quit date: 08/11/1991  . Smokeless tobacco: Never Used  . Alcohol use 2.0 oz/week    4 drink(s) per week  . Drug use: No  . Sexual activity: Yes    Partners: Male    Birth control/ protection: Condom   Other Topics Concern  . Not on file   Social History Narrative  . No narrative on file    . Family History  Problem Relation Age of Onset  . Hypertension Mother      Review of Systems     Patient Active Problem List   Diagnosis Date Noted  . Overweight 08/17/2014     Prior to Admission medications   Not on File     No Known Allergies     Objective:  Physical Exam  Constitutional: She is oriented to person, place, and time. She appears well-developed and well-nourished.  HENT:  Head: Normocephalic and atraumatic.  Right Ear: External ear normal.  Left Ear: External ear normal.  Nose: Nose normal.  Mouth/Throat: No oropharyngeal exudate.  Erythematous pharynx, negative of exudate.   Eyes: Conjunctivae and EOM are normal. Pupils are equal, round, and reactive to light.  Neck: Normal range of motion.  Cardiovascular: Normal rate, regular  rhythm, normal heart sounds and intact distal pulses.   Pulmonary/Chest: Effort normal and breath sounds normal.  Musculoskeletal: Normal range of motion.  Neurological: She is alert and oriented to person, place, and time.  Skin: Skin is warm and dry.  Psychiatric: She has a normal mood and affect. Her behavior is normal. Judgment and thought content normal.   Vitals:   04/14/16 0939  BP: 120/84  Pulse: 98  Resp: 16  Temp: 98 F (36.7 C)     Assessment & Plan:  .1. Sore throat-Likely a viral illness. Will treat symptomatically. - POCT rapid strep A-negative  - Culture, Group A Strep, pending. . . benzonatate (TESSALON) 100 MG capsule    Sig: Take 1-2 capsules (100-200 mg total) by mouth 3 (three) times daily as needed for cough.  . Guaifenesin (MUCINEX MAXIMUM STRENGTH) 1200 MG TB12    Sig: Take 1 tablet (1,200 mg total) by mouth every 12 (twelve) hours as needed.   If symptoms worsen I will consider antibiotic therapy.  Follow-up as needed   Godfrey PickKimberly S. Tiburcio PeaHarris, MSN, FNP-C [facility]

## 2016-04-16 LAB — CULTURE, GROUP A STREP: Organism ID, Bacteria: NORMAL

## 2016-06-23 ENCOUNTER — Other Ambulatory Visit: Payer: Self-pay | Admitting: Nurse Practitioner

## 2016-06-23 DIAGNOSIS — Z1231 Encounter for screening mammogram for malignant neoplasm of breast: Secondary | ICD-10-CM

## 2016-07-17 ENCOUNTER — Ambulatory Visit: Payer: BLUE CROSS/BLUE SHIELD

## 2016-08-06 ENCOUNTER — Encounter: Payer: Self-pay | Admitting: Nurse Practitioner

## 2016-08-07 ENCOUNTER — Other Ambulatory Visit: Payer: Self-pay | Admitting: Nurse Practitioner

## 2016-08-07 MED ORDER — MEDROXYPROGESTERONE ACETATE 10 MG PO TABS: 10.0000 mg | ORAL_TABLET | Freq: Every day | ORAL | 0 refills | Status: DC

## 2016-08-14 ENCOUNTER — Ambulatory Visit: Payer: BLUE CROSS/BLUE SHIELD

## 2016-08-18 DIAGNOSIS — D225 Melanocytic nevi of trunk: Secondary | ICD-10-CM | POA: Diagnosis not present

## 2016-08-18 DIAGNOSIS — L814 Other melanin hyperpigmentation: Secondary | ICD-10-CM | POA: Diagnosis not present

## 2016-08-18 DIAGNOSIS — D1801 Hemangioma of skin and subcutaneous tissue: Secondary | ICD-10-CM | POA: Diagnosis not present

## 2016-08-18 DIAGNOSIS — L821 Other seborrheic keratosis: Secondary | ICD-10-CM | POA: Diagnosis not present

## 2016-08-28 ENCOUNTER — Ambulatory Visit: Payer: BLUE CROSS/BLUE SHIELD

## 2016-09-08 ENCOUNTER — Ambulatory Visit
Admission: RE | Admit: 2016-09-08 | Discharge: 2016-09-08 | Disposition: A | Discharge status: Home / Self Care | Payer: BLUE CROSS/BLUE SHIELD | Source: Ambulatory Visit | Attending: Nurse Practitioner | Admitting: Nurse Practitioner

## 2016-09-08 DIAGNOSIS — Z1231 Encounter for screening mammogram for malignant neoplasm of breast: Secondary | ICD-10-CM | POA: Diagnosis not present

## 2016-09-21 ENCOUNTER — Ambulatory Visit: Payer: BLUE CROSS/BLUE SHIELD | Admitting: Nurse Practitioner

## 2016-09-28 ENCOUNTER — Ambulatory Visit: Payer: BLUE CROSS/BLUE SHIELD | Admitting: Nurse Practitioner

## 2016-10-22 DIAGNOSIS — D3131 Benign neoplasm of right choroid: Secondary | ICD-10-CM | POA: Diagnosis not present

## 2016-10-26 ENCOUNTER — Telehealth: Payer: Self-pay | Admitting: Nurse Practitioner

## 2016-10-26 NOTE — Telephone Encounter (Signed)
Return call to patient. Requesting appointment with Shirlyn GoltzPatty Grubb to check pain behind right breast on under side where bra sits. Has had this for approximately one week. Feels like a "catch in it." denies fever or other pain. "Feel fine otherwise." Does not have PCP. Recent MMG was normal per patient. Office visit scheduled for tomorrow at 1245, declined earlier appointment. Instructed to proceed to urgent care or ED if change in symptoms, chest pain or SOB.  Has annual scheduled for 11-17-16. Last annual  09-16-15.  Routing to provider for final review. Patient agreeable to disposition. Will close encounter.

## 2016-10-26 NOTE — Telephone Encounter (Signed)
Patient called and requested to speak with the nurse about a pain she is having under her right breast.  Last seen: 10/18/15

## 2016-10-27 ENCOUNTER — Encounter: Payer: Self-pay | Admitting: Nurse Practitioner

## 2016-10-27 ENCOUNTER — Other Ambulatory Visit: Payer: Self-pay | Admitting: Nurse Practitioner

## 2016-10-27 ENCOUNTER — Ambulatory Visit (INDEPENDENT_AMBULATORY_CARE_PROVIDER_SITE_OTHER): Payer: BLUE CROSS/BLUE SHIELD | Admitting: Nurse Practitioner

## 2016-10-27 VITALS — BP 122/76 | HR 72 | Ht 66.0 in | Wt 219.0 lb

## 2016-10-27 DIAGNOSIS — R1011 Right upper quadrant pain: Secondary | ICD-10-CM | POA: Diagnosis not present

## 2016-10-27 NOTE — Progress Notes (Signed)
   Subjective:   46 y.o. Married Caucasian female presents for evaluation of right breast pain vs chest wall pain. Onset of the symptoms was 1 week ago. Patient sought evaluation because of pain under right breast and upper abdomen.    Denies any constitutional symptoms. Patient denies history of trauma but has been laying on the couch on left side but turned to the right to watch TV and napping on her back. Last mammogram was 09/08/16 and was normal.  Previous evaluation has included Mammo only, no biopsy or US. She is also having an increase in right upper quadrant pain with radiation to the side but not thru to the back.  She has associated abdominal fullness at the epigastric area and increase in GERD.  Had intolerance to some foods but this is not new.  No change in color or character on stool.  This area feels like a "catch in it".   Review of Systems Gastrointestinal: positive for abdominal pain, dyspepsia and reflux symptomsSUBJECTIVE   Objective:   General appearance: alert, cooperative, appears stated age and mild distress Head: atraumatic Neck: thyroid not enlarged, symmetric, no tenderness/mass/nodules Back: symmetric, no curvature. ROM normal. No CVA tenderness.  Chest wall:  Tender at the ribs under the breast and upper quadrant of abdomen Lungs: clear to auscultation bilaterally Breasts: normal appearance, no masses or tenderness Heart: regular rate and rhythm Abdomen: abnormal findings:  distended and obese    Assessment:   ASSESSMENT:Patient is diagnosed with normal breast exam   Increase in GI symptoms with GERD  Chest wall pain that could positional strain   Plan:   PLAN: The patient  has a documented plan to follow with further care of abdominal US on  11/03/16 2. PLAN: FOLLOW with results

## 2016-10-27 NOTE — Patient Instructions (Signed)
Will get US of abdomen

## 2016-10-27 NOTE — Progress Notes (Signed)
Spoke with Administrator, Civil ServiceCathy at [facility]. Patient scheduled while in office for US of abdomen complete on 11/03/16 at 9:55am at [address] location. Advised patient nothing to eat or drink after midnight. Patient verbalizes understanding and is agreeable to date and time.

## 2016-10-29 NOTE — Progress Notes (Signed)
Encounter reviewed by Dr. Baudelio Karnes Amundson C. Silva.  

## 2016-11-03 ENCOUNTER — Ambulatory Visit
Admission: RE | Admit: 2016-11-03 | Discharge: 2016-11-03 | Disposition: A | Discharge status: Home / Self Care | Payer: BLUE CROSS/BLUE SHIELD | Source: Ambulatory Visit | Attending: Nurse Practitioner | Admitting: Nurse Practitioner

## 2016-11-03 ENCOUNTER — Telehealth: Payer: Self-pay | Admitting: Nurse Practitioner

## 2016-11-03 DIAGNOSIS — R1011 Right upper quadrant pain: Secondary | ICD-10-CM

## 2016-11-03 NOTE — Telephone Encounter (Signed)
Patient is called about her abdominal US.  She is given information about abdominal ultrasound -that there is no stone found in the GB but she still may have a non or partially functioning GB.  There was also liver issues which could be related to a fatty liver.  She has agreed to GI referral with Dr. Loreta AveMann to address the right upper quadrant pain, bloating, GERD, and US findings.

## 2016-11-12 ENCOUNTER — Other Ambulatory Visit: Payer: Self-pay | Admitting: Gastroenterology

## 2016-11-12 DIAGNOSIS — R140 Abdominal distension (gaseous): Secondary | ICD-10-CM | POA: Diagnosis not present

## 2016-11-12 DIAGNOSIS — K219 Gastro-esophageal reflux disease without esophagitis: Secondary | ICD-10-CM | POA: Diagnosis not present

## 2016-11-12 DIAGNOSIS — K760 Fatty (change of) liver, not elsewhere classified: Secondary | ICD-10-CM | POA: Diagnosis not present

## 2016-11-12 DIAGNOSIS — R1011 Right upper quadrant pain: Secondary | ICD-10-CM

## 2016-11-12 LAB — BASIC METABOLIC PANEL
BUN: 14 mg/dL (ref 4–21)
Creatinine: 1.1 mg/dL (ref 0.5–1.1)
Glucose: 82 mg/dL
Potassium: 4.7 mmol/L (ref 3.4–5.3)
Sodium: 138 mmol/L (ref 137–147)

## 2016-11-12 LAB — CBC AND DIFFERENTIAL
HCT: 41 % (ref 36–46)
Hemoglobin: 12.7 g/dL (ref 12.0–16.0)
Platelets: 350 10*3/uL (ref 150–399)
WBC: 9.2 10^3/mL

## 2016-11-12 LAB — HEPATIC FUNCTION PANEL
ALT: 23 U/L (ref 7–35)
AST: 25 U/L (ref 13–35)
Alkaline Phosphatase: 71 U/L (ref 25–125)
Bilirubin, Total: 0.2 mg/dL

## 2016-11-12 LAB — TSH: TSH: 1.38 u[IU]/mL (ref 0.41–5.90)

## 2016-11-17 ENCOUNTER — Ambulatory Visit: Payer: BLUE CROSS/BLUE SHIELD | Admitting: Nurse Practitioner

## 2016-11-24 ENCOUNTER — Ambulatory Visit (HOSPITAL_COMMUNITY)
Admission: RE | Admit: 2016-11-24 | Discharge: 2016-11-24 | Disposition: A | Discharge status: Home / Self Care | Payer: BLUE CROSS/BLUE SHIELD | Source: Ambulatory Visit | Attending: Gastroenterology | Admitting: Gastroenterology

## 2016-11-24 DIAGNOSIS — R1011 Right upper quadrant pain: Secondary | ICD-10-CM | POA: Diagnosis not present

## 2016-11-24 MED ORDER — TECHNETIUM TC 99M MEBROFENIN IV KIT
5.0000 | PACK | Freq: Once | INTRAVENOUS | Status: AC | PRN
  Administered 2016-11-24: 5 via INTRAVENOUS

## 2016-12-15 ENCOUNTER — Ambulatory Visit (INDEPENDENT_AMBULATORY_CARE_PROVIDER_SITE_OTHER): Payer: BLUE CROSS/BLUE SHIELD | Admitting: Nurse Practitioner

## 2016-12-15 ENCOUNTER — Encounter: Payer: Self-pay | Admitting: Nurse Practitioner

## 2016-12-15 VITALS — BP 118/74 | HR 70 | Temp 97.4°F | Ht 68.0 in | Wt 212.0 lb

## 2016-12-15 DIAGNOSIS — E6609 Other obesity due to excess calories: Secondary | ICD-10-CM

## 2016-12-15 DIAGNOSIS — K760 Fatty (change of) liver, not elsewhere classified: Secondary | ICD-10-CM | POA: Diagnosis not present

## 2016-12-15 DIAGNOSIS — Z6832 Body mass index (BMI) 32.0-32.9, adult: Secondary | ICD-10-CM

## 2016-12-15 DIAGNOSIS — R1011 Right upper quadrant pain: Secondary | ICD-10-CM | POA: Diagnosis not present

## 2016-12-15 MED ORDER — TIZANIDINE HCL 4 MG PO CAPS: 4.0000 mg | ORAL_CAPSULE | Freq: Three times a day (TID) | ORAL | 0 refills | Status: DC | PRN

## 2016-12-15 MED ORDER — PHENTERMINE HCL 37.5 MG PO CAPS: 37.5000 mg | ORAL_CAPSULE | ORAL | 0 refills | Status: DC

## 2016-12-15 MED ORDER — NAPROXEN 500 MG PO TABS: 500.0000 mg | ORAL_TABLET | Freq: Two times a day (BID) | ORAL | 0 refills | Status: DC | PRN

## 2016-12-15 NOTE — Progress Notes (Signed)
Pre visit review using our clinic review tool, if applicable. No additional management support is needed unless otherwise documented below in the visit note. 

## 2016-12-15 NOTE — Progress Notes (Signed)
Subjective:    Patient ID: Kristie Baldwin, female    DOB: 1971/07/21, 46 y.o.   MRN: [id number]  Patient presents today for establish care (new patient)  HPI  RUQ ABD pain: Onset: 10/2016 Evaluated by Dr. Loreta AveMann ([facility])) Hida scan and ABD US normal. Labs done by GYN (normal) Intermittent. Waxing and waning. No specific aggravating or relieving factor. No OTC medication used.  Fatty Liver: Incidental finding on Hida scan. She has changed diet. Some ETOH consumption. Has f/up appt with GI for liver CT.  Obesity: She will like additional help. Started daily exercise (walking 2miles) and heart healthy diet x 6month.  Immunizations: (TDAP, Hep C screen, Pneumovax, Influenza, zoster)  Health Maintenance  Topic Date Due  . Flu Shot  09/16/2035*  . Pap Smear  09/15/2018  . Tetanus Vaccine  08/26/2021  . HIV Screening  Completed  *Topic was postponed. The date shown is not the original due date.   Diet:healthy Weight:  Wt Readings from Last 3 Encounters:  12/15/16 212 lb (96.2 kg)  10/27/16 219 lb (99.3 kg)  04/14/16 210 lb 9.6 oz (95.5 kg)   Exercise:daily Fall Risk: Fall Risk  12/15/2016 04/14/2016  Falls in the past year? No No   Home Safety:home with husband Depression/Suicide: Depression screen [facility] 2/9 12/15/2016 04/14/2016  Decreased Interest 0 0  Down, Depressed, Hopeless 0 0  PHQ - 2 Score 0 0   Pap Smear (every 6876yrs for >21-29 without HPV, every 367yrs for >30-3667yrs with HPV):up to date, done by GYN  Mammogram (yearly, >97467yrs):up to date  Sexual History (birth control, marital status, STD):married, not sexually active.   Medications and allergies reviewed with patient and updated if appropriate.  Patient Active Problem List   Diagnosis Date Noted  . NAFLD (nonalcoholic fatty liver disease) 11/91/478205/03/2017  . Overweight 08/17/2014    No current outpatient prescriptions on file prior to visit.   No current facility-administered medications on  file prior to visit.     Past Medical History:  Diagnosis Date  . Depression   . Dysmenorrhea   . Peptic ulcer   . STD (sexually transmitted disease)    HSV    History reviewed. No pertinent surgical history.  Social History   Social History  . Marital status: Married    Spouse name: N/A  . Number of children: 0  . Years of education: N/A   Occupational History  .  [employer]   Social History Main Topics  . Smoking status: Former Smoker    Years: 0.00    Quit date: 08/11/1991  . Smokeless tobacco: Never Used  . Alcohol use 4.2 oz/week    3 Cans of beer, 4 Standard drinks or equivalent per week     Comment: social  . Drug use: No  . Sexual activity: Yes    Partners: Male    Birth control/ protection: Condom   Other Topics Concern  . None   Social History Narrative  . None    Family History  Problem Relation Age of Onset  . Hypertension Mother   . Arthritis Mother     OA  . Cancer Father     prostate cancer        Review of Systems  Constitutional: Negative for fever, malaise/fatigue and weight loss.  HENT: Negative for congestion and sore throat.   Eyes:       Negative for visual changes  Respiratory: Negative for cough and shortness of breath.  Cardiovascular: Negative for chest pain, palpitations and leg swelling.  Gastrointestinal: Positive for abdominal pain. Negative for blood in stool, constipation, diarrhea, heartburn and nausea.  Genitourinary: Negative for dysuria, frequency and urgency.  Musculoskeletal: Negative for falls, joint pain and myalgias.  Skin: Negative for rash.  Neurological: Negative for dizziness, sensory change and headaches.  Endo/Heme/Allergies: Does not bruise/bleed easily.  Psychiatric/Behavioral: Negative for depression, substance abuse and suicidal ideas. The patient is not nervous/anxious.     Objective:   Vitals:   12/15/16 0905  BP: 118/74  Pulse: 70  Temp: 97.4 F (36.3 C)    Body mass index is  32.23 kg/m.   Physical Examination:  Physical Exam  Constitutional: She is oriented to person, place, and time and well-developed, well-nourished, and in no distress. No distress.  HENT:  Right Ear: External ear normal.  Left Ear: External ear normal.  Nose: Nose normal.  Mouth/Throat: Oropharynx is clear and moist. No oropharyngeal exudate.  Eyes: Conjunctivae and EOM are normal. Pupils are equal, round, and reactive to light. No scleral icterus.  Neck: Normal range of motion. Neck supple. No thyromegaly present.  Cardiovascular: Normal rate, normal heart sounds and intact distal pulses.   Pulmonary/Chest: Effort normal and breath sounds normal. She exhibits no tenderness.  Abdominal: Soft. Bowel sounds are normal. She exhibits no distension and no mass. There is no tenderness. There is no rebound and no guarding.  Musculoskeletal: Normal range of motion. She exhibits no edema or tenderness.  Lymphadenopathy:    She has no cervical adenopathy.  Neurological: She is alert and oriented to person, place, and time. Gait normal.  Skin: Skin is warm and dry.  Psychiatric: Affect and judgment normal.  Vitals reviewed.   ASSESSMENT and PLAN:  Tasneem was seen today for establish care.  Diagnoses and all orders for this visit:  Colicky RUQ abdominal pain -     naproxen (NAPROSYN) 500 MG tablet; Take 1 tablet (500 mg total) by mouth 2 (two) times daily as needed (for pain, take with food). -     tiZANidine (ZANAFLEX) 4 MG capsule; Take 1 capsule (4 mg total) by mouth 3 (three) times daily as needed for muscle spasms.  NAFLD (nonalcoholic fatty liver disease)  Class 1 obesity due to excess calories without serious comorbidity with body mass index (BMI) of 32.0 to 32.9 in adult -     phentermine 37.5 MG capsule; Take 1 capsule (37.5 mg total) by mouth every morning.   No problem-specific Assessment & Plan notes found for this encounter.     Follow up: Return in about 2 weeks  (around 12/29/2016) for weight loss and RUQ pain.  Alysia Penna, NP

## 2016-12-15 NOTE — Patient Instructions (Signed)

## 2016-12-21 ENCOUNTER — Encounter: Payer: Self-pay | Admitting: Nurse Practitioner

## 2016-12-21 NOTE — Progress Notes (Signed)
Abstracted result and sent to scan  

## 2016-12-29 ENCOUNTER — Ambulatory Visit: Payer: BLUE CROSS/BLUE SHIELD | Admitting: Nurse Practitioner

## 2017-01-01 ENCOUNTER — Encounter: Payer: Self-pay | Admitting: Nurse Practitioner

## 2017-01-01 ENCOUNTER — Ambulatory Visit (INDEPENDENT_AMBULATORY_CARE_PROVIDER_SITE_OTHER): Payer: BLUE CROSS/BLUE SHIELD | Admitting: Nurse Practitioner

## 2017-01-01 DIAGNOSIS — Z6832 Body mass index (BMI) 32.0-32.9, adult: Secondary | ICD-10-CM

## 2017-01-01 DIAGNOSIS — E6609 Other obesity due to excess calories: Secondary | ICD-10-CM

## 2017-01-01 MED ORDER — PHENTERMINE HCL 37.5 MG PO CAPS: 37.5000 mg | ORAL_CAPSULE | ORAL | 1 refills | Status: DC

## 2017-01-01 NOTE — Progress Notes (Signed)
Subjective:  Patient ID: Kristie Baldwin, female    DOB: [birth year]  Age: 46 y.o. MRN: [id number]  CC: Follow-up (2 wk follow up/no pain/ pain on lower back when work out. )   HPI  Weight loss: Denies any adverse effects from phentermine. She continue to maintain healthy diet and regular exercise.  Outpatient Medications Prior to Visit  Medication Sig Dispense Refill  . naproxen (NAPROSYN) 500 MG tablet Take 1 tablet (500 mg total) by mouth 2 (two) times daily as needed (for pain, take with food). 20 tablet 0  . tiZANidine (ZANAFLEX) 4 MG capsule Take 1 capsule (4 mg total) by mouth 3 (three) times daily as needed for muscle spasms. 20 capsule 0  . valACYclovir (VALTREX) 500 MG tablet Take 500 mg by mouth 2 (two) times daily.    . phentermine 37.5 MG capsule Take 1 capsule (37.5 mg total) by mouth every morning. 30 capsule 0   No facility-administered medications prior to visit.     ROS Review of Systems  Constitutional: Negative for malaise/fatigue.  Eyes:       Negative for visual changes  Respiratory: Negative for shortness of breath.   Cardiovascular: Negative for chest pain, palpitations and leg swelling.  Gastrointestinal: Negative for constipation, diarrhea and nausea.  Genitourinary: Negative for dysuria, frequency and urgency.  Musculoskeletal: Negative for myalgias.  Skin: Negative for rash.  Neurological: Negative for dizziness, sensory change and headaches.  Psychiatric/Behavioral: Negative for depression. The patient is not nervous/anxious and does not have insomnia.     Objective:  BP 110/86   Pulse 88   Temp 97.7 F (36.5 C)   Ht 5\' 8"  (1.727 m)   Wt 202 lb (91.6 kg)   SpO2 98%   BMI 30.71 kg/m   BP Readings from Last 3 Encounters:  01/01/17 110/86  12/15/16 118/74  10/27/16 122/76    Wt Readings from Last 3 Encounters:  01/01/17 202 lb (91.6 kg)  12/15/16 212 lb (96.2 kg)  10/27/16 219 lb (99.3 kg)    Physical Exam  Constitutional:  She is oriented to person, place, and time. No distress.  Neck: Normal range of motion. Neck supple.  Cardiovascular: Normal rate, regular rhythm and normal heart sounds.   Pulmonary/Chest: Effort normal and breath sounds normal.  Abdominal: Soft. Bowel sounds are normal. She exhibits no distension. There is no tenderness.  Musculoskeletal: She exhibits no edema or tenderness.  Neurological: She is alert and oriented to person, place, and time. Coordination normal.  Skin: Skin is warm and dry. No rash noted. No erythema.  Vitals reviewed.   Lab Results  Component Value Date   WBC 9.2 11/12/2016   HGB 12.7 11/12/2016   HCT 41 11/12/2016   PLT 350 11/12/2016   GLUCOSE 90 10/18/2015   CHOL 166 09/16/2015   TRIG 71 09/16/2015   HDL 45 (L) 09/16/2015   LDLCALC 107 09/16/2015   ALT 23 11/12/2016   AST 25 11/12/2016   NA 138 11/12/2016   K 4.7 11/12/2016   CL 103 10/18/2015   CREATININE 1.1 11/12/2016   BUN 14 11/12/2016   CO2 27 10/18/2015   TSH 1.38 11/12/2016    Nm Hepato W/eject Fract  Result Date: 11/24/2016 CLINICAL DATA:  Right upper quadrant pain for 3 weeks. EXAM: NUCLEAR MEDICINE HEPATOBILIARY IMAGING WITH GALLBLADDER EF TECHNIQUE: Sequential images of the abdomen were obtained out to 60 minutes following intravenous administration of radiopharmaceutical. After oral ingestion of 8 ounces of Ensure, gallbladder ejection  fraction was determined. RADIOPHARMACEUTICALS:  5.0 mCi Tc-75m Choletec IV COMPARISON:  Right upper quadrant ultrasound 11/03/2016. FINDINGS: Prompt uptake and biliary excretion of activity by the liver is seen. Gallbladder activity is visualized, consistent with patency of cystic duct. Biliary activity passes into small bowel, consistent with patent common bile duct. Calculated gallbladder ejection fraction is 55%%. (Normal gallbladder ejection fraction with half-and-half is greater than 33%.) IMPRESSION: Normal exam. Electronically Signed   By: Drusilla Kanner  M.D.   On: 11/24/2016 14:02    Assessment & Plan:   Kristie Baldwin was seen today for follow-up.  Diagnoses and all orders for this visit:  Class 1 obesity due to excess calories without serious comorbidity with body mass index (BMI) of 32.0 to 32.9 in adult -     phentermine 37.5 MG capsule; Take 1 capsule (37.5 mg total) by mouth every morning.   I spent with Kristie Baldwin discussing healthy lifestyle changes and possible side effects of phentermine. %)% of office visit consist of discussing the importance of warming up and stretching before and after exercise regimen.  I am having Kristie Baldwin maintain her valACYclovir, naproxen, tiZANidine, and phentermine.  Meds ordered this encounter  Medications  . phentermine 37.5 MG capsule    Sig: Take 1 capsule (37.5 mg total) by mouth every morning.    Dispense:  30 capsule    Refill:  1    Do not fill before 01/15/2017    Order Specific Question:   Supervising Provider    Answer:   Tresa Garter [[id number]]    Follow-up: Return in about 2 months (around 03/03/2017) for weight loss .  Alysia Penna, NP

## 2017-01-01 NOTE — Patient Instructions (Addendum)
You are doing a great job. Keep it up.  Please do warm up and stretches prior and after exercise to minimize soreness and muscle spasm.

## 2017-01-20 ENCOUNTER — Encounter: Payer: Self-pay | Admitting: Nurse Practitioner

## 2017-01-20 ENCOUNTER — Ambulatory Visit (INDEPENDENT_AMBULATORY_CARE_PROVIDER_SITE_OTHER): Payer: BLUE CROSS/BLUE SHIELD | Admitting: Nurse Practitioner

## 2017-01-20 VITALS — BP 114/70 | HR 76 | Ht 67.25 in | Wt 198.0 lb

## 2017-01-20 DIAGNOSIS — Z01419 Encounter for gynecological examination (general) (routine) without abnormal findings: Secondary | ICD-10-CM | POA: Diagnosis not present

## 2017-01-20 DIAGNOSIS — E559 Vitamin D deficiency, unspecified: Secondary | ICD-10-CM | POA: Diagnosis not present

## 2017-01-20 DIAGNOSIS — Z0000 Encounter for general adult medical examination without abnormal findings: Secondary | ICD-10-CM | POA: Diagnosis not present

## 2017-01-20 MED ORDER — VALACYCLOVIR HCL 500 MG PO TABS: 500.0000 mg | ORAL_TABLET | Freq: Two times a day (BID) | ORAL | 12 refills | Status: DC

## 2017-01-20 NOTE — Progress Notes (Signed)
Patient ID: Kristie Baldwin, female   DOB: 02-14-1971, 46 y.o.   MRN: [id number]  46 y.o. [id number] Married Caucasian Fe here for annual exam. Now on diet medication with weight loss from 217 to 196 lbs.  She is having light menses  For the most part with only every 3-4 cycle that is heavier.   Patient's last menstrual period was 01/18/2017 (exact date).          Sexually active: Yes.    The current method of family planning is condoms.  Exercising: Yes.    walking, biking, treadmill and yoga Smoker:  no  Health Maintenance: Pap: 09/16/15, Negative with neg HR HPV  08/29/12, Negative History of Abnormal Pap: yes, years ago, repeat only, normal since MMG: 09/08/16, 3D-no, Density Category B, Bi-Rads 1:  Negative Self Breast exams: yes TDaP: 08/27/11 HIV: 10/03/96 Labs: PCP takes care of screening labs (In EPIC), we follow Vitamin D   reports that she quit smoking about 25 years ago. She quit after 0.00 years of use. She has never used smokeless tobacco. She reports that she drinks about 4.2 oz of alcohol per week . She reports that she does not use drugs.  Past Medical History:  Diagnosis Date  . Depression   . Dysmenorrhea   . Peptic ulcer   . STD (sexually transmitted disease)    HSV    History reviewed. No pertinent surgical history.  Current Outpatient Prescriptions  Medication Sig Dispense Refill  . naproxen (NAPROSYN) 500 MG tablet Take 1 tablet (500 mg total) by mouth 2 (two) times daily as needed (for pain, take with food). 20 tablet 0  . phentermine 37.5 MG capsule Take 1 capsule (37.5 mg total) by mouth every morning. 30 capsule 1  . tiZANidine (ZANAFLEX) 4 MG capsule Take 1 capsule (4 mg total) by mouth 3 (three) times daily as needed for muscle spasms. 20 capsule 0  . valACYclovir (VALTREX) 500 MG tablet Take 500 mg by mouth 2 (two) times daily.     No current facility-administered medications for this visit.     Family History  Problem Relation Age of Onset  .  Hypertension Mother   . Arthritis Mother        OA  . Atrial fibrillation Mother        medication  . Cancer Father        prostate cancer  . Heart Problems Father        pace maker 2018    ROS:  Pertinent items are noted in HPI.  Otherwise, a comprehensive ROS was negative.  Exam:   BP 114/70 (BP Location: Right Arm, Patient Position: Sitting, Cuff Size: Large)   Pulse 76   Ht 5' 7.25" (1.708 m)   Wt 198 lb (89.8 kg)   LMP 01/18/2017 (Exact Date)   BMI 30.78 kg/m  Height: 5' 7.25" (170.8 cm) Ht Readings from Last 3 Encounters:  01/20/17 5' 7.25" (1.708 m)  01/01/17 5\' 8"  (1.727 m)  12/15/16 5\' 8"  (1.727 m)    General appearance: alert, cooperative and appears stated age Head: Normocephalic, without obvious abnormality, atraumatic Neck: no adenopathy, supple, symmetrical, trachea midline and thyroid normal to inspection and palpation Lungs: clear to auscultation bilaterally Breasts: normal appearance, no masses or tenderness Heart: regular rate and rhythm Abdomen: soft, non-tender; no masses,  no organomegaly Extremities: extremities normal, atraumatic, no cyanosis or edema Skin: Skin color, texture, turgor normal. No rashes or lesions Lymph nodes: Cervical, supraclavicular, and axillary nodes  normal. No abnormal inguinal nodes palpated Neurologic: Grossly normal   Pelvic: External genitalia:  no lesions              Urethra:  normal appearing urethra with no masses, tenderness or lesions              Bartholin's and Skene's: normal                 Vagina: normal appearing vagina with normal color and discharge, no lesions              Cervix: anteverted              Pap taken: No. Bimanual Exam:  Uterus:  normal size, contour, position, consistency, mobility, non-tender              Adnexa: no mass, fullness, tenderness               Rectovaginal: Confirms               Anus:  normal sphincter tone, no lesions  Chaperone present: no  A:  Well Woman with normal  exam  Perimenopausal c/w irregular menses  History of HSV - uses Valtrex prn  Weight loss program with PCP  Condoms for BC  Vit D deficiency   P:   Reviewed health and wellness pertinent to exam  Pap smear: no  Mammogram is due 08/2017  Refill on Valtrex to use prn  Will follow with Vit D - currently off OTC Vit d  Counseled on breast self exam, mammography screening, adequate intake of calcium and vitamin D, diet and exercise return annually or prn  An After Visit Summary was printed and given to the patient.

## 2017-01-20 NOTE — Patient Instructions (Signed)

## 2017-01-21 LAB — VITAMIN D 25 HYDROXY (VIT D DEFICIENCY, FRACTURES): Vit D, 25-Hydroxy: 26.4 ng/mL — ABNORMAL LOW (ref 30.0–100.0)

## 2017-01-22 NOTE — Progress Notes (Signed)
Encounter reviewed Samad Thon, MD   

## 2017-01-26 ENCOUNTER — Telehealth: Payer: Self-pay | Admitting: *Deleted

## 2017-01-26 NOTE — Telephone Encounter (Signed)
-----   Message from Verner Choleborah S Leonard, CNM sent at 01/21/2017  8:00 AM EDT ----- Notify patient that Vitamin D is low. Start on OTC 1000 IU Vitamin D 3 daily and recheck in 3 months, no order placed, please schedule

## 2017-01-26 NOTE — Telephone Encounter (Signed)
I have attempted to contact this patient by phone with the following results: left message to return call to InteriorStephanie at [phone] on answering machine ([facility]).  No personal information given. [phone] (Mobile)

## 2017-01-28 NOTE — Telephone Encounter (Signed)
Tried calling patient, no answer, left message on VM for patient to return my call at [phone]. No personal information given.

## 2017-01-29 ENCOUNTER — Encounter: Payer: Self-pay | Admitting: Family Medicine

## 2017-01-29 ENCOUNTER — Telehealth: Payer: Self-pay | Admitting: Nurse Practitioner

## 2017-01-29 ENCOUNTER — Ambulatory Visit (INDEPENDENT_AMBULATORY_CARE_PROVIDER_SITE_OTHER): Payer: BLUE CROSS/BLUE SHIELD | Admitting: Family Medicine

## 2017-01-29 VITALS — BP 100/76 | HR 98 | Temp 98.1°F | Wt 195.1 lb

## 2017-01-29 DIAGNOSIS — M79671 Pain in right foot: Secondary | ICD-10-CM | POA: Diagnosis not present

## 2017-01-29 NOTE — Telephone Encounter (Signed)
Patient Name: Kristie DawesNGELA Konicki DOB: February 25, 1971 Initial Comment caller states out of nowhere she has a knot on top of right foot that's painful and she can barely walk . no known injury Nurse Assessment Nurse: Katrina Stackranmore, RN, Dahlia ClientHannah Date/Time (Eastern Time): 01/29/2017 12:31:25 PM Confirm and document reason for call. If symptomatic, describe symptoms. ---Caller states she has a knot on the top of her right foot. The pain shoots through her toes and it hurts to put pressure on her foot. has been going on since yesterday. Foot is a little swollen. Does the patient have any new or worsening symptoms? ---Yes Will a triage be completed? ---Yes Related visit to physician within the last 2 weeks? ---No Does the PT have any chronic conditions? (i.e. diabetes, asthma, etc.) ---No Is the patient pregnant or possibly pregnant? (Ask all females between the ages of 4612-55) ---No Is this a behavioral health or substance abuse call? ---No Guidelines Guideline Title Affirmed Question Affirmed Notes Foot Pain [1] SEVERE pain (e.g., excruciating, unable to do any normal activities) AND [2] not improved after 2 hours of pain medicine Final Disposition User See Physician within 4 Hours (or PCP triage) Katrina Stackranmore, RN, Dahlia ClientHannah Comments No appt available with PCP. Appt scheduled for 415 with Dr[facility] Sel[facility]attenKim at [facility]. Referrals REFERRED TO PCP OFFICE Disagree/Comply: Comply

## 2017-01-29 NOTE — Patient Instructions (Signed)
BEFORE YOU LEAVE: -xray sheet  Get the xray  Schedule follow up in 2-3 weeks  Ice several times daily  Consider post op shoe for a few days  Naproxen 1-2 times daily as needed  I hope you are feeling better soon! Seek care sooner if worsening, new concerns or you are not improving with treatment.

## 2017-01-29 NOTE — Progress Notes (Signed)
HPI:  Acute visit for R foot pain -started yesterday acutely -has been wearing very uncomfortable flats the last 3 days that really hurt her feet  -no known trauma, recent running or pounding activities -severe pain with some weight bearing positions -has not tried any treatments -worries has fracture  ROS: See pertinent positives and negatives per HPI.  Past Medical History:  Diagnosis Date  . Depression   . Dysmenorrhea   . Peptic ulcer   . STD (sexually transmitted disease)    HSV    No past surgical history on file.  Family History  Problem Relation Age of Onset  . Hypertension Mother   . Arthritis Mother        OA  . Atrial fibrillation Mother        medication  . Cancer Father        prostate cancer  . Heart Problems Father        pace maker 2018    Social History   Social History  . Marital status: Married    Spouse name: N/A  . Number of children: 0  . Years of education: N/A   Occupational History  .  [employer]   Social History Main Topics  . Smoking status: Former Smoker    Years: 0.00    Quit date: 08/11/1991  . Smokeless tobacco: Never Used  . Alcohol use 4.2 oz/week    3 Cans of beer, 4 Standard drinks or equivalent per week     Comment: social  . Drug use: No  . Sexual activity: Yes    Partners: Male    Birth control/ protection: Condom   Other Topics Concern  . None   Social History Narrative  . None     Current Outpatient Prescriptions:  .  naproxen (NAPROSYN) 500 MG tablet, Take 1 tablet (500 mg total) by mouth 2 (two) times daily as needed (for pain, take with food)., Disp: 20 tablet, Rfl: 0 .  phentermine 37.5 MG capsule, Take 1 capsule (37.5 mg total) by mouth every morning., Disp: 30 capsule, Rfl: 1 .  tiZANidine (ZANAFLEX) 4 MG capsule, Take 1 capsule (4 mg total) by mouth 3 (three) times daily as needed for muscle spasms., Disp: 20 capsule, Rfl: 0 .  valACYclovir (VALTREX) 500 MG tablet, Take 1 tablet (500 mg total) by  mouth 2 (two) times daily., Disp: 30 tablet, Rfl: 12  EXAM:  Vitals:   01/29/17 1629  BP: 100/76  Pulse: 98  Temp: 98.1 F (36.7 C)    Body mass index is 30.33 kg/m.  GENERAL: vitals reviewed and listed above, alert, oriented, appears well hydrated and in no acute distress  MS: moves all extremities without noticeable abnormality, on inspection both feet and lower extremities - mild swelling over dorsal anterolat foot without erythema, TTP most pronounce over proximal 4th MT, also surround soft tissue TTP  PSYCH: pleasant and cooperative, no obvious depression or anxiety  ASSESSMENT AND PLAN:  Discussed the following assessment and plan:  Right foot pain - Plan: DG Foot Complete Right  -we discussed possible serious and likely etiologies, workup and treatment, treatment risks and return precautions - per haps strain/sprain - with bony TTP and severity of pain opted for plain films to exclude other -after this discussion, Marylene Landngela opted for plain films, ice, naproxen, post op shoe if needed pending results xray -follow up advised 2-3 weeks -of course, we advised Marylene Landngela  to return or notify a doctor immediately if symptoms worsen  or persist or new concerns arise.   Patient Instructions  BEFORE YOU LEAVE: -xray sheet  Get the xray  Schedule follow up in 2-3 weeks  Ice several times daily  Consider post op shoe for a few days  Naproxen 1-2 times daily as needed  I hope you are feeling better soon! Seek care sooner if worsening, new concerns or you are not improving with treatment.       Kriste Basque R., DO

## 2017-02-01 ENCOUNTER — Ambulatory Visit (INDEPENDENT_AMBULATORY_CARE_PROVIDER_SITE_OTHER)
Admission: RE | Admit: 2017-02-01 | Discharge: 2017-02-01 | Disposition: A | Discharge status: Home / Self Care | Payer: BLUE CROSS/BLUE SHIELD | Source: Ambulatory Visit | Attending: Family Medicine | Admitting: Family Medicine

## 2017-02-01 DIAGNOSIS — M79671 Pain in right foot: Secondary | ICD-10-CM

## 2017-02-01 DIAGNOSIS — M7731 Calcaneal spur, right foot: Secondary | ICD-10-CM | POA: Diagnosis not present

## 2017-02-02 ENCOUNTER — Encounter: Payer: Self-pay | Admitting: *Deleted

## 2017-02-03 NOTE — Telephone Encounter (Signed)
I have attempted to contact this patient by phone with the following results: left message to return call to GypsumStephanie at [phone] on answering machine (mobile [facility]). Advised call was regarding Vit D results she has reviewed on MyChart. [phone] (Mobile)

## 2017-02-04 ENCOUNTER — Telehealth: Payer: Self-pay | Admitting: Nurse Practitioner

## 2017-02-04 ENCOUNTER — Encounter: Payer: Self-pay | Admitting: Nurse Practitioner

## 2017-02-04 DIAGNOSIS — E559 Vitamin D deficiency, unspecified: Secondary | ICD-10-CM

## 2017-02-04 NOTE — Telephone Encounter (Signed)
Patient has not returned call. Letter mailed. Closing encounter.

## 2017-02-04 NOTE — Telephone Encounter (Signed)
See MyChart message to patient dated 02/04/17.   Order placed for Vit D lab.  Routing to provider FYI. Will close encounter.   Cc: Ria CommentPatricia Grubb, NP, Zenovia Jordanaylor Mitchell, Francee PiccoloStephanie Phillips

## 2017-02-04 NOTE — Telephone Encounter (Signed)
Patient called and left a message at lunch requesting a call back from the nurse. She also sent the following MyChart message:   To: [facility]    From: Kristie Baldwin    Created: 02/04/2017 1:33 PM     *-*-*This message has not been handled.*-*-*  Hi, I have received several phone calls & a letter about my lab results. I received a lengthy voicemail from Beaver Damaylor on 6/14 telling me the results & what to do. I have not had a chance to call for follow up lab appt. I have been unavailable due to work schedule & now [facility].I left a voicemail at your office today.Is there something she didnt tell me on the voicemail. She said to take OTC 1000 units of vitamin D & recheck in 3 months.   Thanks,  Kristie Baldwin

## 2017-02-04 NOTE — Progress Notes (Signed)
Pt has not returned phone calls regarding labs.  Letter mailed.

## 2017-02-08 DIAGNOSIS — M25571 Pain in right ankle and joints of right foot: Secondary | ICD-10-CM | POA: Diagnosis not present

## 2017-02-23 DIAGNOSIS — L820 Inflamed seborrheic keratosis: Secondary | ICD-10-CM | POA: Diagnosis not present

## 2017-02-23 DIAGNOSIS — L720 Epidermal cyst: Secondary | ICD-10-CM | POA: Diagnosis not present

## 2017-03-01 DIAGNOSIS — M25571 Pain in right ankle and joints of right foot: Secondary | ICD-10-CM | POA: Diagnosis not present

## 2017-03-04 ENCOUNTER — Encounter: Payer: Self-pay | Admitting: Nurse Practitioner

## 2017-03-04 ENCOUNTER — Ambulatory Visit (INDEPENDENT_AMBULATORY_CARE_PROVIDER_SITE_OTHER): Payer: BLUE CROSS/BLUE SHIELD | Admitting: Nurse Practitioner

## 2017-03-04 DIAGNOSIS — Z6832 Body mass index (BMI) 32.0-32.9, adult: Secondary | ICD-10-CM | POA: Diagnosis not present

## 2017-03-04 DIAGNOSIS — E6609 Other obesity due to excess calories: Secondary | ICD-10-CM | POA: Diagnosis not present

## 2017-03-04 MED ORDER — PHENTERMINE HCL 37.5 MG PO CAPS: 37.5000 mg | ORAL_CAPSULE | ORAL | 0 refills | Status: DC

## 2017-03-04 MED ORDER — PHENTERMINE HCL 37.5 MG PO CAPS: 37.5000 mg | ORAL_CAPSULE | ORAL | 2 refills | Status: DC

## 2017-03-04 NOTE — Progress Notes (Signed)
Subjective:  Patient ID: Kristie Baldwin, female    DOB: January 11, 1971  Age: 46 y.o. MRN: [id number]  CC: Follow-up (2 mo fu/weight loss follow up. )   HPI Weight loss management:  Goal weight 175Lbs She continues to exercise and maintain heart healthy diet/ small portions. She denies any adverse effects with medication.  Outpatient Medications Prior to Visit  Medication Sig Dispense Refill  . valACYclovir (VALTREX) 500 MG tablet Take 1 tablet (500 mg total) by mouth 2 (two) times daily. 30 tablet 12  . phentermine 37.5 MG capsule Take 1 capsule (37.5 mg total) by mouth every morning. 30 capsule 1  . naproxen (NAPROSYN) 500 MG tablet Take 1 tablet (500 mg total) by mouth 2 (two) times daily as needed (for pain, take with food). (Patient not taking: Reported on 03/04/2017) 20 tablet 0  . tiZANidine (ZANAFLEX) 4 MG capsule Take 1 capsule (4 mg total) by mouth 3 (three) times daily as needed for muscle spasms. (Patient not taking: Reported on 03/04/2017) 20 capsule 0   No facility-administered medications prior to visit.     ROS Review of Systems  Constitutional: Negative for malaise/fatigue.  Cardiovascular: Negative for chest pain and palpitations.  Neurological: Negative for dizziness and headaches.  Psychiatric/Behavioral: Negative for substance abuse. The patient is not nervous/anxious and does not have insomnia.      Objective:  BP 98/74   Pulse 99   Temp 97.7 F (36.5 C)   Ht 5' 7.25" (1.708 m)   Wt 187 lb (84.8 kg)   SpO2 99%   BMI 29.07 kg/m   BP Readings from Last 3 Encounters:  03/04/17 98/74  01/29/17 100/76  01/20/17 114/70    Wt Readings from Last 3 Encounters:  03/04/17 187 lb (84.8 kg)  01/29/17 195 lb 1.6 oz (88.5 kg)  01/20/17 198 lb (89.8 kg)    Physical Exam  Constitutional: She is oriented to person, place, and time. No distress.  Neck: Normal range of motion. Neck supple. No thyromegaly present.  Cardiovascular: Normal rate and regular  rhythm.   Pulmonary/Chest: Effort normal.  Musculoskeletal: She exhibits no edema.  Neurological: She is alert and oriented to person, place, and time.  Skin: Skin is warm and dry.  Vitals reviewed.   Lab Results  Component Value Date   WBC 9.2 11/12/2016   HGB 12.7 11/12/2016   HCT 41 11/12/2016   PLT 350 11/12/2016   GLUCOSE 90 10/18/2015   CHOL 166 09/16/2015   TRIG 71 09/16/2015   HDL 45 (L) 09/16/2015   LDLCALC 107 09/16/2015   ALT 23 11/12/2016   AST 25 11/12/2016   NA 138 11/12/2016   K 4.7 11/12/2016   CL 103 10/18/2015   CREATININE 1.1 11/12/2016   BUN 14 11/12/2016   CO2 27 10/18/2015   TSH 1.38 11/12/2016    Dg Foot Complete Right  Result Date: 02/01/2017 CLINICAL DATA:  Acute right foot pain following biking. Initial encounter. EXAM: RIGHT FOOT COMPLETE - 3+ VIEW COMPARISON:  None. FINDINGS: There is no evidence of fracture, subluxation or dislocation. The joint spaces are unremarkable. A small calcaneal spur is present. No soft tissue abnormalities are identified. IMPRESSION: No acute abnormalities. Small calcaneal spur. Electronically Signed   By: Harmon PierJeffrey  Hu M.D.   On: 02/01/2017 17:11    Assessment & Plan:   Kristie Baldwin was seen today for follow-up.  Diagnoses and all orders for this visit:  Class 1 obesity due to excess calories without serious comorbidity  with body mass index (BMI) of 32.0 to 32.9 in adult -     Discontinue: phentermine 37.5 MG capsule; Take 1 capsule (37.5 mg total) by mouth every morning. -     phentermine 37.5 MG capsule; Take 1 capsule (37.5 mg total) by mouth every morning.   I have discontinued [name]'s naproxen and tiZANidine. I am also having her maintain her valACYclovir and phentermine.  Meds ordered this encounter  Medications  . DISCONTD: phentermine 37.5 MG capsule    Sig: Take 1 capsule (37.5 mg total) by mouth every morning.    Dispense:  30 capsule    Refill:  2    Do not fill before 01/15/2017    Order Specific  Question:   Supervising Provider    Answer:   Tresa GarterPLOTNIKOV, ALEKSEI V [[id number]]  . phentermine 37.5 MG capsule    Sig: Take 1 capsule (37.5 mg total) by mouth every morning.    Dispense:  90 capsule    Refill:  0    Do not fill before 01/15/2017    Order Specific Question:   Supervising Provider    Answer:   Tresa GarterPLOTNIKOV, ALEKSEI V [[id number]]    Follow-up: Return in about 3 months (around 06/04/2017) for CPE (fasting).  Alysia Pennaharlotte Hendricks Schwandt, NP

## 2017-03-04 NOTE — Patient Instructions (Signed)
Keep up the good work

## 2017-03-22 ENCOUNTER — Encounter: Payer: Self-pay | Admitting: Nurse Practitioner

## 2017-04-07 NOTE — Telephone Encounter (Signed)
Pt called about this. She wanted Claris GowerCharlotte to be aware. She said that she is going to be needing a refill but the pharmacy was suppose to be contacting us.

## 2017-06-04 ENCOUNTER — Ambulatory Visit (INDEPENDENT_AMBULATORY_CARE_PROVIDER_SITE_OTHER): Payer: BLUE CROSS/BLUE SHIELD | Admitting: Nurse Practitioner

## 2017-06-04 ENCOUNTER — Other Ambulatory Visit (INDEPENDENT_AMBULATORY_CARE_PROVIDER_SITE_OTHER): Payer: BLUE CROSS/BLUE SHIELD

## 2017-06-04 ENCOUNTER — Encounter: Payer: Self-pay | Admitting: Nurse Practitioner

## 2017-06-04 VITALS — BP 104/76 | HR 83 | Temp 97.6°F | Ht 67.25 in | Wt 167.0 lb

## 2017-06-04 DIAGNOSIS — K760 Fatty (change of) liver, not elsewhere classified: Secondary | ICD-10-CM

## 2017-06-04 DIAGNOSIS — Z0000 Encounter for general adult medical examination without abnormal findings: Secondary | ICD-10-CM | POA: Diagnosis not present

## 2017-06-04 DIAGNOSIS — Z6832 Body mass index (BMI) 32.0-32.9, adult: Secondary | ICD-10-CM

## 2017-06-04 DIAGNOSIS — E669 Obesity, unspecified: Secondary | ICD-10-CM | POA: Insufficient documentation

## 2017-06-04 DIAGNOSIS — E6609 Other obesity due to excess calories: Secondary | ICD-10-CM

## 2017-06-04 DIAGNOSIS — Z13220 Encounter for screening for lipoid disorders: Secondary | ICD-10-CM

## 2017-06-04 DIAGNOSIS — Z0001 Encounter for general adult medical examination with abnormal findings: Secondary | ICD-10-CM

## 2017-06-04 DIAGNOSIS — Z136 Encounter for screening for cardiovascular disorders: Secondary | ICD-10-CM

## 2017-06-04 LAB — LIPID PANEL
Cholesterol: 150 mg/dL (ref 0–200)
HDL: 43.9 mg/dL (ref >39.00)
LDL Cholesterol: 96 mg/dL (ref 0–99)
NonHDL: 105.71
Total CHOL/HDL Ratio: 3
Triglycerides: 49 mg/dL (ref 0.0–149.0)
VLDL: 9.8 mg/dL (ref 0.0–40.0)

## 2017-06-04 LAB — HEPATIC FUNCTION PANEL
ALT: 12 U/L (ref 0–35)
AST: 17 U/L (ref 0–37)
Albumin: 4.5 g/dL (ref 3.5–5.2)
Alkaline Phosphatase: 60 U/L (ref 39–117)
Bilirubin, Direct: 0.1 mg/dL (ref 0.0–0.3)
Total Bilirubin: 0.5 mg/dL (ref 0.2–1.2)
Total Protein: 7.7 g/dL (ref 6.0–8.3)

## 2017-06-04 MED ORDER — PHENTERMINE HCL 37.5 MG PO CAPS: 37.5000 mg | ORAL_CAPSULE | ORAL | 0 refills | Status: DC

## 2017-06-04 NOTE — Assessment & Plan Note (Signed)
Lost over 40lbs in last 6months. Phentermine Rx provided today will be used to wean off over the next 6weeks. 

## 2017-06-04 NOTE — Assessment & Plan Note (Addendum)
Incidental finding with ABD US. NAFLD fibrosis score of -3.7 which indicates unlikely significant fibrosis. Ordered hepatitis and C panel today. consider CT ABD with contrast if negative for hepatitis B and C.

## 2017-06-04 NOTE — Assessment & Plan Note (Deleted)
Lost over 40lbs in last 6months. Phentermine Rx provided today will be used to wean off over the next 6weeks.

## 2017-06-04 NOTE — Progress Notes (Signed)
Subjective:    Patient ID: Kristie Baldwin, female    DOB: 04/08/1971, 46 y.o.   MRN: [id number]  Patient presents today for complete physical  HPI  Obesity: Lost over 40lbs in last 6months with help pf phentermine, diet changes, portion control and exercise. Denies any adverse effects with phentermine. Wt Readings from Last 3 Encounters:  06/04/17 167 lb (75.8 kg)  03/04/17 187 lb (84.8 kg)  01/29/17 195 lb 1.6 oz (88.5 kg)   BP Readings from Last 3 Encounters:  06/04/17 104/76  03/04/17 98/74  01/29/17 100/76   Immunizations: (TDAP, Hep C screen, Pneumovax, Influenza, zoster)  Health Maintenance  Topic Date Due  . Flu Shot  09/16/2035*  . Pap Smear  09/15/2018  . Tetanus Vaccine  08/26/2021  . HIV Screening  Completed  *Topic was postponed. The date shown is not the original due date.   Diet:heart healthy.  Weight:  Wt Readings from Last 3 Encounters:  06/04/17 167 lb (75.8 kg)  03/04/17 187 lb (84.8 kg)  01/29/17 195 lb 1.6 oz (88.5 kg)   Exercise:daily.  Fall Risk: Fall Risk  06/04/2017 12/15/2016 04/14/2016  Falls in the past year? No No No   Depression/Suicide: Depression screen [facility] 2/9 06/04/2017 12/15/2016 04/14/2016  Decreased Interest 0 0 0  Down, Depressed, Hopeless 0 0 0  PHQ - 2 Score 0 0 0   Vision: up to date  Dental:up to date  Medications and allergies reviewed with patient and updated if appropriate.  Patient Active Problem List   Diagnosis Date Noted  . Class 1 obesity due to excess calories without serious comorbidity with body mass index (BMI) of 32.0 to 32.9 in adult 06/04/2017  . NAFLD (nonalcoholic fatty liver disease) 40/98/119105/03/2017    Current Outpatient Prescriptions on File Prior to Visit  Medication Sig Dispense Refill  . valACYclovir (VALTREX) 500 MG tablet Take 1 tablet (500 mg total) by mouth 2 (two) times daily. 30 tablet 12   No current facility-administered medications on file prior to visit.     Past Medical History:    Diagnosis Date  . Depression   . Dysmenorrhea   . Peptic ulcer   . STD (sexually transmitted disease)    HSV    No past surgical history on file.  Social History   Social History  . Marital status: Married    Spouse name: N/A  . Number of children: 0  . Years of education: N/A   Occupational History  .  [employer]   Social History Main Topics  . Smoking status: Former Smoker    Years: 0.00    Quit date: 08/11/1991  . Smokeless tobacco: Never Used  . Alcohol use 4.2 oz/week    3 Cans of beer, 4 Standard drinks or equivalent per week     Comment: social  . Drug use: No  . Sexual activity: Yes    Partners: Male    Birth control/ protection: Condom   Other Topics Concern  . None   Social History Narrative  . None    Family History  Problem Relation Age of Onset  . Hypertension Mother   . Arthritis Mother        OA  . Atrial fibrillation Mother        medication  . Cancer Father        prostate cancer  . Heart Problems Father        pace maker 2018  Review of Systems  Constitutional: Negative for fever and malaise/fatigue.  HENT: Negative for congestion and sore throat.   Eyes:       Negative for visual changes  Respiratory: Negative for cough and shortness of breath.   Cardiovascular: Negative for chest pain, palpitations and leg swelling.  Gastrointestinal: Negative for blood in stool, constipation, diarrhea and heartburn.  Genitourinary: Negative for dysuria, frequency and urgency.  Musculoskeletal: Negative for falls, joint pain and myalgias.  Skin: Negative for rash.  Neurological: Negative for dizziness, sensory change and headaches.  Endo/Heme/Allergies: Does not bruise/bleed easily.  Psychiatric/Behavioral: Negative for depression, substance abuse and suicidal ideas. The patient is not nervous/anxious.     Objective:   Vitals:   06/04/17 0815  BP: 104/76  Pulse: 83  Temp: 97.6 F (36.4 C)  SpO2: 97%    Body mass index is  25.96 kg/m.   Physical Examination:  Physical Exam  Constitutional: She is oriented to person, place, and time and well-developed, well-nourished, and in no distress. No distress.  HENT:  Right Ear: External ear normal.  Left Ear: External ear normal.  Nose: Nose normal.  Mouth/Throat: Oropharynx is clear and moist. No oropharyngeal exudate.  Eyes: Pupils are equal, round, and reactive to light. Conjunctivae and EOM are normal. No scleral icterus.  Neck: Normal range of motion. Neck supple. No thyromegaly present.  Cardiovascular: Normal rate, regular rhythm and normal heart sounds.   Pulmonary/Chest: Effort normal and breath sounds normal. She exhibits no tenderness.  Breast exam deferred to GYN  Abdominal: Soft. Bowel sounds are normal. She exhibits no distension. There is no tenderness.  Genitourinary:  Genitourinary Comments: Deferred to GYN  Musculoskeletal: Normal range of motion. She exhibits no edema or tenderness.  Lymphadenopathy:    She has no cervical adenopathy.  Neurological: She is alert and oriented to person, place, and time. Gait normal.  Skin: Skin is warm and dry.  Psychiatric: Affect and judgment normal.  Vitals reviewed.   ASSESSMENT and PLAN:  Romayne was seen today for annual exam.  Diagnoses and all orders for this visit:  Encounter for preventative adult health care exam with abnormal findings -     Lipid panel; Future  NAFLD (nonalcoholic fatty liver disease) -     Hepatic function panel; Future -     Hepatitis C Antibody; Future -     Hepatitis B surface antigen; Future  Encounter for lipid screening for cardiovascular disease -     Lipid panel; Future  Class 1 obesity due to excess calories without serious comorbidity with body mass index (BMI) of 32.0 to 32.9 in adult -     phentermine 37.5 MG capsule; Take 1 capsule (37.5 mg total) by mouth every morning.    NAFLD (nonalcoholic fatty liver disease) Incidental finding with ABD  [place]. NAFLD fibrosis score of -3.7 which indicates unlikely significant fibrosis. Ordered hepatitis and C panel today. consider CT ABD with contrast if negative for hepatitis B and C.  Class 1 obesity due to excess calories without serious comorbidity with body mass index (BMI) of 32.0 to 32.9 in adult Lost over 40lbs in last 6months. Phentermine Rx provided today will be used to wean off over the next 6weeks.      Follow up: Return in about 6 months (around 12/03/2017) for weight loss. at [place] location.  Alysia Penna, NP

## 2017-06-04 NOTE — Patient Instructions (Addendum)
Negative for hep B and C. Normal lipid panel and hepatic panel. We can proceed to CT ABD with contrast to further evaluate fatty liver. Let me know if you will like for CT to be ordered  NAFLD fibrosis score of -3.7 which indicates unlikely significant fibrosis.  Please contact insurance about cologuard coverage for colon cancer screen.  Consider CT ABD if hepatitis is negative.  Wean off phentermine: take 1tab every other day x 2weeks, then 1tab 3 times a week till complete.

## 2017-06-05 LAB — HEPATITIS B SURFACE ANTIGEN: Hepatitis B Surface Ag: NONREACTIVE

## 2017-06-05 LAB — HEPATITIS C ANTIBODY
Hepatitis C Ab: NONREACTIVE
SIGNAL TO CUT-OFF: 0.01 (ref <1.00)

## 2017-06-07 ENCOUNTER — Telehealth: Payer: Self-pay | Admitting: Nurse Practitioner

## 2017-06-07 DIAGNOSIS — K760 Fatty (change of) liver, not elsewhere classified: Secondary | ICD-10-CM

## 2017-06-07 NOTE — Telephone Encounter (Signed)
-----   Message from PhetcharatLivingston Diones Noitamyae, LPN sent at 16/10/960410/29/2018 10:05 AM EDT ----- Pt verbalize understand of test result.   Pt is agree to CT. Please put in order.

## 2017-06-14 ENCOUNTER — Telehealth: Payer: Self-pay | Admitting: Obstetrics & Gynecology

## 2017-06-14 NOTE — Telephone Encounter (Signed)
Spoke with patient. Reports increased urinary frequency and urge, started on 11/4. States flow when voiding is small to normal.   Denies lower back pain, blood in urine, N/V, fever/chills.   Recommended OV for further evaluation. Patient last seen on 01/20/17 by Ria CommentPatricia Grubb, NP. scheduled for OV on 11/6 at 8:15am with Dr. Edward JollySilva.   Advised patient Dr. Edward JollySilva will review, will return call with any additional recommendations, patient verbalizes understanding and is agreeable.  Routing to provider for final review. Patient is agreeable to disposition. Will close encounter.

## 2017-06-14 NOTE — Telephone Encounter (Signed)
Patient thinks she may have a bladder infection. To triage to assist with scheduling.

## 2017-06-15 ENCOUNTER — Inpatient Hospital Stay: Admission: RE | Admit: 2017-06-15 | Payer: BLUE CROSS/BLUE SHIELD | Source: Ambulatory Visit

## 2017-06-15 ENCOUNTER — Ambulatory Visit: Payer: BLUE CROSS/BLUE SHIELD | Admitting: Obstetrics and Gynecology

## 2017-06-15 ENCOUNTER — Encounter: Payer: Self-pay | Admitting: Obstetrics and Gynecology

## 2017-06-15 VITALS — BP 110/70 | HR 68 | Resp 16 | Wt 167.0 lb

## 2017-06-15 DIAGNOSIS — K5900 Constipation, unspecified: Secondary | ICD-10-CM | POA: Diagnosis not present

## 2017-06-15 DIAGNOSIS — R3915 Urgency of urination: Secondary | ICD-10-CM

## 2017-06-15 DIAGNOSIS — N951 Menopausal and female climacteric states: Secondary | ICD-10-CM

## 2017-06-15 LAB — POCT URINALYSIS DIPSTICK
Bilirubin, UA: NEGATIVE
Blood, UA: NEGATIVE
Glucose, UA: NEGATIVE
Ketones, UA: NEGATIVE
Leukocytes, UA: NEGATIVE
Nitrite, UA: NEGATIVE
Protein, UA: NEGATIVE
Urobilinogen, UA: 0.2 E.U./dL
pH, UA: 5 (ref 5.0–8.0)

## 2017-06-15 NOTE — Progress Notes (Signed)
GYNECOLOGY  VISIT   HPI: 46 y.o.   Married  Caucasian  female   G1P0010 with Patient's last menstrual period was 04/17/2017.   here for  Urgency to urinate.  Needing to void often.  DF - once every several hours.  NF - none.   No dysuria.  No hematuria.   Last UTI was about 1 year or longer.  No hx of stones.   No caffeine use.  No carbonated beverages.   No urinary incontinence.   Some constipation.  BM every 3 days.  This is new.  Tried several options including laxative.  Increased fiber.  Tried yogurt.   TSH 1.38 on 11/12/16.   Saw gastroenterology in April 2018 due to abdominal pain.  This was Dr. Loreta AveMann.  Now seeing [facility]. Lost 50 pounds, abdominal pain gone, and feels great.  Menses in September.  No cycle in October 2018.  This is the first time this year to miss her menses. Had a prolonged menses in August.  This was the first time this occurred.  Urine dip - negative.   PCP - [facility].  GYNECOLOGIC HISTORY: Patient's last menstrual period was 04/17/2017. Contraception:  none Menopausal hormone therapy:  none Last mammogram:  09/08/16, 3D-no, Density Category B, Bi-Rads 1:  Negative Last pap smear:    09/16/15, Negative with neg HR HPV             08/29/12, Negative Urine: Negative        OB History    Gravida Para Term Preterm AB Living   1 0 0 0 1 0   SAB TAB Ectopic Multiple Live Births   0 1 0 0 0         Patient Active Problem List   Diagnosis Date Noted  . Class 1 obesity due to excess calories without serious comorbidity with body mass index (BMI) of 32.0 to 32.9 in adult 06/04/2017  . NAFLD (nonalcoholic fatty liver disease) 69/62/952805/03/2017    Past Medical History:  Diagnosis Date  . Depression   . Dysmenorrhea   . Peptic ulcer   . STD (sexually transmitted disease)    HSV    History reviewed. No pertinent surgical history.  Current Outpatient Medications  Medication Sig Dispense Refill  . phentermine 37.5 MG capsule Take 1  capsule (37.5 mg total) by mouth every morning. 30 capsule 0  . valACYclovir (VALTREX) 500 MG tablet Take 1 tablet (500 mg total) by mouth 2 (two) times daily. 30 tablet 12   No current facility-administered medications for this visit.      ALLERGIES: Patient has no known allergies.  Family History  Problem Relation Age of Onset  . Hypertension Mother   . Arthritis Mother        OA  . Atrial fibrillation Mother        medication  . Cancer Father        prostate cancer  . Heart Problems Father        pace maker 2018    Social History   Socioeconomic History  . Marital status: Married    Spouse name: Not on file  . Number of children: 0  . Years of education: Not on file  . Highest education level: Not on file  Social Needs  . Financial resource strain: Not on file  . Food insecurity - worry: Not on file  . Food insecurity - inability: Not on file  . Transportation needs - medical: Not on file  .  Transportation needs - non-medical: Not on file  Occupational History    Employer: [employer]  Tobacco Use  . Smoking status: Former Smoker    Years: 0.00    Last attempt to quit: 08/11/1991    Years since quitting: 25.8  . Smokeless tobacco: Never Used  Substance and Sexual Activity  . Alcohol use: Yes    Alcohol/week: 4.2 oz    Types: 3 Cans of beer, 4 Standard drinks or equivalent per week    Comment: social  . Drug use: No  . Sexual activity: Yes    Partners: Male    Birth control/protection: Condom  Other Topics Concern  . Not on file  Social History Narrative  . Not on file    ROS:  Pertinent items are noted in HPI.  PHYSICAL EXAMINATION:    BP 110/70 (BP Location: Right Arm, Patient Position: Sitting, Cuff Size: Normal)   Pulse 68   Resp 16   Wt 167 lb (75.8 kg)   LMP 04/17/2017   BMI 25.96 kg/m     General appearance: alert, cooperative and appears stated age   Declines exam.  ASSESSMENT  Urinary frequency.  Constipation.  Menopausal  symptoms.   PLAN  Reassurance regarding normal urine dip.  Discussed causes of urinary frequency.  Reviewed bladder irritants.  No anticholinergic/antimuscarinic today.  Discussed constipation and Colace, Miralax, and probiotics.  List of herbal options for menopausal symptoms. Call for missed menses x 3 months or for prolonged spotting with cycle.  Follow up for annual exam in June 2019.   An After Visit Summary was printed and given to the patient.  _15_____ minutes face to face time of which over 50% was spent in counseling.

## 2017-06-15 NOTE — Patient Instructions (Addendum)
Constipation, Adult Constipation is when a person has fewer bowel movements in a week than normal, has difficulty having a bowel movement, or has stools that are dry, hard, or larger than normal. Constipation may be caused by an underlying condition. It may become worse with age if a person takes certain medicines and does not take in enough fluids. Follow these instructions at home: Eating and drinking   Eat foods that have a lot of fiber, such as fresh fruits and vegetables, whole grains, and beans.  Limit foods that are high in fat, low in fiber, or overly processed, such as french fries, hamburgers, cookies, candies, and soda.  Drink enough fluid to keep your urine clear or pale yellow. General instructions  Exercise regularly or as told by your health care provider.  Go to the restroom when you have the urge to go. Do not hold it in.  Take over-the-counter and prescription medicines only as told by your health care provider. These include any fiber supplements.  Practice pelvic floor retraining exercises, such as deep breathing while relaxing the lower abdomen and pelvic floor relaxation during bowel movements.  Watch your condition for any changes.  Keep all follow-up visits as told by your health care provider. This is important. Contact a health care provider if:  You have pain that gets worse.  You have a fever.  You do not have a bowel movement after 4 days.  You vomit.  You are not hungry.  You lose weight.  You are bleeding from the anus.  You have thin, pencil-like stools. Get help right away if:  You have a fever and your symptoms suddenly get worse.  You leak stool or have blood in your stool.  Your abdomen is bloated.  You have severe pain in your abdomen.  You feel dizzy or you faint. This information is not intended to replace advice given to you by your health care provider. Make sure you discuss any questions you have with your health care  provider. Document Released: 04/24/2004 Document Revised: 02/14/2016 Document Reviewed: 01/15/2016 Elsevier Interactive Patient Education  2017 Elsevier Inc.  Try Colace 100 mg at bed time for constipation.  You may also try Miralax daily.    Urinary Frequency, Adult Urinary frequency means urinating more often than usual. People with urinary frequency urinate at least 8 times in 24 hours, even if they drink a normal amount of fluid. Although they urinate more often than normal, the total amount of urine produced in a day may be normal. Urinary frequency is also called pollakiuria. What are the causes? This condition may be caused by:  A urinary tract infection.  Obesity.  Bladder problems, such as bladder stones.  Caffeine or alcohol.  Eating food or drinking fluids that irritate the bladder. These include coffee, tea, soda, artificial sweeteners, citrus, tomato-based foods, and chocolate.  Certain medicines, such as medicines that help the body get rid of extra fluid (diuretics).  Muscle or nerve weakness.  Overactive bladder.  Chronic diabetes.  Interstitial cystitis.  In men, problems with the prostate, such as an enlarged prostate.  In women, pregnancy.  In some cases, the cause may not be known. What increases the risk? This condition is more likely to develop in:  Women who have gone through menopause.  Men with prostate problems.  People with a disease or injury that affects the nerves or spinal cord.  People who have or have had a condition that affects the brain, such as a  stroke.  What are the signs or symptoms? Symptoms of this condition include:  Feeling an urgent need to urinate often. The stress and anxiety of needing to find a bathroom quickly can make this urge worse.  Urinating 8 or more times in 24 hours.  Urinating as often as every 1 to 2 hours.  How is this diagnosed? This condition is diagnosed based on your symptoms, your medical  history, and a physical exam. You may have tests, such as:  Blood tests.  Urine tests.  Imaging tests, such as X-rays or ultrasounds.  A bladder test.  A test of your neurological system. This is the body system that senses the need to urinate.  A test to check for problems in the urethra and bladder called cystoscopy.  You may also be asked to keep a bladder diary. A bladder diary is a record of what you eat and drink, how often you urinate, and how much you urinate. You may need to see a health care provider who specializes in conditions of the urinary tract (urologist) or kidneys (nephrologist). How is this treated? Treatment for this condition depends on the cause. Sometimes the condition goes away on its own and treatment is not necessary. If treatment is needed, it may include:  Taking medicine.  Learning exercises that strengthen the muscles that help control urination.  Following a bladder training program. This may include: ? Learning to delay going to the bathroom. ? Double urinating (voiding). This helps if you are not completely emptying your bladder. ? Scheduled voiding.  Making diet changes, such as: ? Avoiding caffeine. ? Drinking fewer fluids, especially alcohol. ? Not drinking in the evening. ? Not having foods or drinks that may irritate the bladder. ? Eating foods that help prevent or ease constipation. Constipation can make this condition worse.  Having the nerves in your bladder stimulated. There are two options for stimulating the nerves to your bladder: ? Outpatient electrical nerve stimulation. This is done by your health care provider. ? Surgery to implant a bladder pacemaker. The pacemaker helps to control the urge to urinate.  Follow these instructions at home:  Keep a bladder diary if told to by your health care provider.  Take over-the-counter and prescription medicines only as told by your health care provider.  Do any exercises as told by  your health care provider.  Follow a bladder training program as told by your health care provider.  Make any recommended diet changes.  Keep all follow-up visits as told by your health care provider. This is important. Contact a health care provider if:  You start urinating more often.  You feel pain or irritation when you urinate.  You notice blood in your urine.  Your urine looks cloudy.  You develop a fever.  You begin vomiting. Get help right away if:  You are unable to urinate. This information is not intended to replace advice given to you by your health care provider. Make sure you discuss any questions you have with your health care provider. Document Released: 05/23/2009 Document Revised: 08/28/2015 Document Reviewed: 02/20/2015 Elsevier Interactive Patient Education  2018 [employer]  Menopause and Herbal Products What is menopause? Menopause is the normal time of life when menstrual periods decrease in frequency and eventually stop completely. This process can take several years for some women. Menopause is complete when you have had an absence of menstruation for a full year since your last menstrual period. It usually occurs between the ages of  48 and 55. It is not common for menopause to begin before the age of 22. During menopause, your body stops producing the female hormones estrogen and progesterone. Common symptoms associated with this loss of hormones (vasomotor symptoms) are:  Hot flashes.  Hot flushes.  Night sweats.  Other common symptoms and complications of menopause include:  Decrease in sex drive.  Vaginal dryness and thinning of the walls of the vagina. This can make sex painful.  Dryness of the skin and development of wrinkles.  Headaches.  Tiredness.  Irritability.  Memory problems.  Weight gain.  Bladder infections.  Hair growth on the face and chest.  Inability to reproduce offspring (infertility).  Loss of density in  the bones (osteoporosis) increasing your risk for breaks (fractures).  Depression.  Hardening and narrowing of the arteries (atherosclerosis). This increases your risk of heart attack and stroke.  What treatment options are available? There are many treatment choices for menopause symptoms. The most common treatment is hormone replacement therapy. Many alternative therapies for menopause are emerging, including the use of herbal products. These supplements can be found in the form of herbs, teas, oils, tinctures, and pills. Common herbal supplements for menopause are made from plants that contain phytoestrogens. Phytoestrogens are compounds that occur naturally in plants and plant products. They act like estrogen in the body. Foods and herbs that contain phytoestrogens include:  Soy.  Flax seeds.  Red clover.  Ginseng.  What menopause symptoms may be helped if I use herbal products?  Vasomotor symptoms. These may be helped by: ? Soy. Some studies show that soy may have a moderate benefit for hot flashes. ? Black cohosh. There is limited evidence indicating this may be beneficial for hot flashes.  Symptoms that are related to heart and blood vessel disease. These may be helped by soy. Studies have shown that soy can help to lower cholesterol.  Depression. This may be helped by: ? St. John's wort. There is limited evidence that shows this may help mild to moderate depression. ? Black cohosh. There is evidence that this may help depression and mood swings.  Osteoporosis. Soy may help to decrease bone loss that is associated with menopause and may prevent osteoporosis. Limited evidence indicates that red clover may offer some bone loss protection as well. Other herbal products that are commonly used during menopause lack enough evidence to support their use as a replacement for conventional menopause therapies. These products include evening primrose, ginseng, and red clover. What are the  cases when herbal products should not be used during menopause? Do not use herbal products during menopause without your health care provider's approval if:  You are taking medicine.  You have a preexisting liver condition.  Are there any risks in my taking herbal products during menopause? If you choose to use herbal products to help with symptoms of menopause, keep in mind that:  Different supplements have different and unmeasured amounts of herbal ingredients.  Herbal products are not regulated the same way that medicines are.  Concentrations of herbs may vary depending on the way they are prepared. For example, the concentration may be different in a pill, tea, oil, and tincture.  Little is known about the risks of using herbal products, particularly the risks of long-term use.  Some herbal supplements can be harmful when combined with certain medicines.  Most commonly reported side effects of herbal products are mild. However, if used improperly, many herbal supplements can cause serious problems. Talk to your health  care provider before starting any herbal product. If problems develop, stop taking the supplement and let your health care provider know. This information is not intended to replace advice given to you by your health care provider. Make sure you discuss any questions you have with your health care provider. Document Released: 01/13/2008 Document Revised: 06/23/2016 Document Reviewed: 01/09/2014 Elsevier Interactive Patient Education  2017 [employer]

## 2017-08-31 ENCOUNTER — Other Ambulatory Visit: Payer: Self-pay | Admitting: Obstetrics and Gynecology

## 2017-08-31 DIAGNOSIS — Z1231 Encounter for screening mammogram for malignant neoplasm of breast: Secondary | ICD-10-CM

## 2017-09-20 ENCOUNTER — Ambulatory Visit
Admission: RE | Admit: 2017-09-20 | Discharge: 2017-09-20 | Disposition: A | Discharge status: Home / Self Care | Payer: BLUE CROSS/BLUE SHIELD | Source: Ambulatory Visit | Attending: Obstetrics and Gynecology | Admitting: Obstetrics and Gynecology

## 2017-09-20 DIAGNOSIS — Z1231 Encounter for screening mammogram for malignant neoplasm of breast: Secondary | ICD-10-CM | POA: Diagnosis not present

## 2017-09-28 ENCOUNTER — Ambulatory Visit: Payer: BLUE CROSS/BLUE SHIELD | Admitting: Certified Nurse Midwife

## 2017-09-28 ENCOUNTER — Other Ambulatory Visit: Payer: Self-pay

## 2017-09-28 ENCOUNTER — Encounter: Payer: Self-pay | Admitting: Certified Nurse Midwife

## 2017-09-28 VITALS — BP 110/78 | HR 76 | Resp 14 | Wt 162.0 lb

## 2017-09-28 DIAGNOSIS — N898 Other specified noninflammatory disorders of vagina: Secondary | ICD-10-CM | POA: Diagnosis not present

## 2017-09-28 DIAGNOSIS — B009 Herpesviral infection, unspecified: Secondary | ICD-10-CM

## 2017-09-28 NOTE — Progress Notes (Signed)
47 y.o. Married Caucasian female G1P0010 here with complaint of vaginal symptoms of  increase in odor and discharge.  Describes discharge as brown and watery. Patient had Valtrex to treat outbreak and feel this may have been the cause of the discharge. Has had not had one in over one year. Onset of symptoms 4 days ago. Denies new personal products or recent shaving. No STD concerns. Urinary symptoms mild urgency, no pain or odor. . Contraception is abstinence for past 6  Months.  Review of Systems  Constitutional: Positive for malaise/fatigue.  Gastrointestinal: Negative.   Genitourinary: Negative for frequency.  Skin:       Herpes outbreak  Psychiatric/Behavioral: Negative.     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 Discussed Aveeno or baking soda sitz bath for comfort and control of odor until lab in. Discussed using Valtrex per instructions at onset to decrease discomfort time. Questions addressed. Lab: Affirm  Rv prn

## 2017-09-28 NOTE — Patient Instructions (Signed)
Use baking soda or Aveeno sitz bath for comfort and odor until lab results in. Continue Herpes medication.  Kristie Baldwin

## 2017-09-29 ENCOUNTER — Other Ambulatory Visit: Payer: Self-pay

## 2017-09-29 LAB — VAGINITIS/VAGINOSIS, DNA PROBE
Candida Species: NEGATIVE
Gardnerella vaginalis: POSITIVE — AB
Trichomonas vaginosis: NEGATIVE

## 2017-09-29 MED ORDER — METRONIDAZOLE 500 MG PO TABS: 500.0000 mg | ORAL_TABLET | Freq: Two times a day (BID) | ORAL | 0 refills | Status: DC

## 2017-10-14 DIAGNOSIS — L814 Other melanin hyperpigmentation: Secondary | ICD-10-CM | POA: Diagnosis not present

## 2017-10-14 DIAGNOSIS — D225 Melanocytic nevi of trunk: Secondary | ICD-10-CM | POA: Diagnosis not present

## 2017-10-14 DIAGNOSIS — L821 Other seborrheic keratosis: Secondary | ICD-10-CM | POA: Diagnosis not present

## 2017-12-17 ENCOUNTER — Encounter: Payer: Self-pay | Admitting: Nurse Practitioner

## 2017-12-17 ENCOUNTER — Ambulatory Visit: Payer: BLUE CROSS/BLUE SHIELD | Admitting: Nurse Practitioner

## 2017-12-17 VITALS — BP 110/76 | HR 75 | Temp 97.6°F | Ht 67.25 in | Wt 155.8 lb

## 2017-12-17 DIAGNOSIS — K760 Fatty (change of) liver, not elsewhere classified: Secondary | ICD-10-CM | POA: Diagnosis not present

## 2017-12-17 DIAGNOSIS — Z713 Dietary counseling and surveillance: Secondary | ICD-10-CM

## 2017-12-17 DIAGNOSIS — E559 Vitamin D deficiency, unspecified: Secondary | ICD-10-CM | POA: Diagnosis not present

## 2017-12-17 LAB — BASIC METABOLIC PANEL
BUN: 13 mg/dL (ref 6–23)
CO2: 30 mEq/L (ref 19–32)
Calcium: 9.7 mg/dL (ref 8.4–10.5)
Chloride: 104 mEq/L (ref 96–112)
Creatinine, Ser: 0.99 mg/dL (ref 0.40–1.20)
GFR: 63.5 mL/min (ref >60.00)
Glucose, Bld: 79 mg/dL (ref 70–99)
Potassium: 4.7 mEq/L (ref 3.5–5.1)
Sodium: 138 mEq/L (ref 135–145)

## 2017-12-17 LAB — LIPID PANEL
Cholesterol: 156 mg/dL (ref 0–200)
HDL: 45.3 mg/dL (ref >39.00)
LDL Cholesterol: 102 mg/dL — ABNORMAL HIGH (ref 0–99)
NonHDL: 110.79
Total CHOL/HDL Ratio: 3
Triglycerides: 45 mg/dL (ref 0.0–149.0)
VLDL: 9 mg/dL (ref 0.0–40.0)

## 2017-12-17 LAB — HEPATIC FUNCTION PANEL
ALT: 16 U/L (ref 0–35)
AST: 17 U/L (ref 0–37)
Albumin: 4.2 g/dL (ref 3.5–5.2)
Alkaline Phosphatase: 43 U/L (ref 39–117)
Bilirubin, Direct: 0.1 mg/dL (ref 0.0–0.3)
Total Bilirubin: 0.5 mg/dL (ref 0.2–1.2)
Total Protein: 7.2 g/dL (ref 6.0–8.3)

## 2017-12-17 LAB — CBC
HCT: 40.6 % (ref 36.0–46.0)
Hemoglobin: 13.4 g/dL (ref 12.0–15.0)
MCHC: 32.9 g/dL (ref 30.0–36.0)
MCV: 85.9 fl (ref 78.0–100.0)
Platelets: 273 10*3/uL (ref 150.0–400.0)
RBC: 4.72 Mil/uL (ref 3.87–5.11)
RDW: 12.6 % (ref 11.5–15.5)
WBC: 6.7 10*3/uL (ref 4.0–10.5)

## 2017-12-17 LAB — VITAMIN D 25 HYDROXY (VIT D DEFICIENCY, FRACTURES): VITD: 32.25 ng/mL (ref 30.00–100.00)

## 2017-12-17 MED ORDER — PHENTERMINE HCL 15 MG PO CAPS: 15.0000 mg | ORAL_CAPSULE | ORAL | 0 refills | Status: DC

## 2017-12-17 NOTE — Progress Notes (Signed)
Subjective:  Patient ID: Kristie Baldwin, female    DOB: 08-18-1970  Age: 47 y.o. MRN: [id number]  CC: Follow-up (follow up weight loss--)   HPI  Fatty Liver: Unable to afford CT ABD/Pelvis to evaluate fatty liver.  Weight loss: Weight goal 152Lbs. Has not used phentermine since 06/2017. Maintaining daily exercise, heart healthy diet and small portions.  Outpatient Medications Prior to Visit  Medication Sig Dispense Refill  . BIOTIN PO Take by mouth.    . Cholecalciferol (VITAMIN D3 PO) Take by mouth.    . Cyanocobalamin (B-12 PO) Take by mouth.    . valACYclovir (VALTREX) 500 MG tablet Take 1 tablet (500 mg total) by mouth 2 (two) times daily. 30 tablet 12  . metroNIDAZOLE (FLAGYL) 500 MG tablet Take 1 tablet (500 mg total) by mouth 2 (two) times daily. (Patient not taking: Reported on 12/17/2017) 14 tablet 0   No facility-administered medications prior to visit.     ROS See HPI  Objective:  BP 110/76   Pulse 75   Temp 97.6 F (36.4 C) (Oral)   Ht 5' 7.25" (1.708 m)   Wt 155 lb 12.8 oz (70.7 kg)   SpO2 96%   BMI 24.22 kg/m   BP Readings from Last 3 Encounters:  12/17/17 110/76  09/28/17 110/78  06/15/17 110/70    Wt Readings from Last 3 Encounters:  12/17/17 155 lb 12.8 oz (70.7 kg)  09/28/17 162 lb (73.5 kg)  06/15/17 167 lb (75.8 kg)    Physical Exam  Constitutional: She is oriented to person, place, and time. No distress.  Cardiovascular: Normal rate, regular rhythm, normal heart sounds and intact distal pulses.  Pulmonary/Chest: Effort normal and breath sounds normal.  Abdominal: Soft. Bowel sounds are normal.  Neurological: She is alert and oriented to person, place, and time.  Skin: Skin is dry.  Psychiatric: She has a normal mood and affect. Her behavior is normal. Thought content normal.  Vitals reviewed.   Lab Results  Component Value Date   WBC 6.7 12/17/2017   HGB 13.4 12/17/2017   HCT 40.6 12/17/2017   PLT 273.0 12/17/2017   GLUCOSE 79 12/17/2017   CHOL 156 12/17/2017   TRIG 45.0 12/17/2017   HDL 45.30 12/17/2017   LDLCALC 102 (H) 12/17/2017   ALT 16 12/17/2017   AST 17 12/17/2017   NA 138 12/17/2017   K 4.7 12/17/2017   CL 104 12/17/2017   CREATININE 0.99 12/17/2017   BUN 13 12/17/2017   CO2 30 12/17/2017   TSH 1.38 11/12/2016    Mm Screening Breast Tomo Bilateral  Result Date: 09/21/2017 CLINICAL DATA:  Screening. EXAM: DIGITAL SCREENING BILATERAL MAMMOGRAM WITH TOMO AND CAD COMPARISON:  Previous exam(s). ACR Breast Density Category b: There are scattered areas of fibroglandular density. FINDINGS: There are no findings suspicious for malignancy. Images were processed with CAD. IMPRESSION: No mammographic evidence of malignancy. A result letter of this screening mammogram will be mailed directly to the patient. RECOMMENDATION: Screening mammogram in one year. (Code:SM-B-01Y) BI-RADS CATEGORY  1: Negative. Electronically Signed   By: Sherian Rein M.D.   On: 09/21/2017 10:49    Assessment & Plan:   Kristie Baldwin was seen today for follow-up.  Diagnoses and all orders for this visit:  NAFLD (nonalcoholic fatty liver disease) -     Lipid panel -     Hepatic function panel  Weight loss counseling, encounter for -     CBC -     Basic metabolic panel -  phentermine 15 MG capsule; Take 1 capsule (15 mg total) by mouth every morning.  Vitamin D insufficiency -     VITAMIN D 25 Hydroxy (Vit-D Deficiency, Fractures)   I have discontinued Divine S. Yearsley's metroNIDAZOLE. I am also having her start on phentermine. Additionally, I am having her maintain her valACYclovir, Cholecalciferol (VITAMIN D3 PO), Cyanocobalamin (B-12 PO), and BIOTIN PO.  Meds ordered this encounter  Medications  . phentermine 15 MG capsule    Sig: Take 1 capsule (15 mg total) by mouth every morning.    Dispense:  15 capsule    Refill:  0    Order Specific Question:   Supervising Provider    Answer:   Dianne Dun [[id number]]     Follow-up: Return in about 1 year (around 12/18/2018) for CPE (fasting).  Alysia Penna, NP

## 2017-12-17 NOTE — Patient Instructions (Addendum)
Stable lab results.  KEEP UP THE EXCELLENT JOB.

## 2018-01-12 DIAGNOSIS — D3131 Benign neoplasm of right choroid: Secondary | ICD-10-CM | POA: Diagnosis not present

## 2018-01-12 DIAGNOSIS — H5213 Myopia, bilateral: Secondary | ICD-10-CM | POA: Diagnosis not present

## 2018-01-12 DIAGNOSIS — H52223 Regular astigmatism, bilateral: Secondary | ICD-10-CM | POA: Diagnosis not present

## 2018-02-09 ENCOUNTER — Ambulatory Visit: Payer: BLUE CROSS/BLUE SHIELD | Admitting: Obstetrics and Gynecology

## 2018-03-22 NOTE — Progress Notes (Signed)
47 y.o. [id number] Married Caucasian female here for annual exam.    Menses:  Spreading out to day 24 - 42 days.  Hot flashes lessening with weight loss.   Decreased sex drive.  Partner also has this. They talk about it.   Does not like to take medication.   Lost 55 pounds.  Took Phentermine.  PCP:   Alysia PennaNche, [place] NP  Patient's last menstrual period was 03/20/2018.           Sexually active: No.   Not active for 2 years.   The current method of family planning is abstinence.    Exercising: Yes.    run, bike, walk Smoker:  no  Health Maintenance: Pap: 09/16/15 Neg. HR HPV:Neg  History of abnormal Pap:  Yes, remote hx MMG:  09/20/17 BIRADS1:Negative Colonoscopy: Never BMD:   None TDaP: 08/27/11 Gardasil: n/a HIV: 1998 Hep C: 06/04/17 Negative Screening Labs: PCP   reports that she quit smoking about 26 years ago. She quit after 0.00 years of use. She has never used smokeless tobacco. She reports that she drinks about 4.0 standard drinks of alcohol per week. She reports that she does not use drugs.  Past Medical History:  Diagnosis Date  . Depression   . Dysmenorrhea   . Peptic ulcer   . STD (sexually transmitted disease)    HSV    History reviewed. No pertinent surgical history.  Current Outpatient Medications  Medication Sig Dispense Refill  . BIOTIN PO Take by mouth.    . Cholecalciferol (VITAMIN D3 PO) Take by mouth.    . Cyanocobalamin (B-12 PO) Take by mouth.    . valACYclovir (VALTREX) 500 MG tablet Take 1 tablet (500 mg total) by mouth 2 (two) times daily. 30 tablet 12   No current facility-administered medications for this visit.     Family History  Problem Relation Age of Onset  . Hypertension Mother   . Arthritis Mother        OA  . Atrial fibrillation Mother        medication  . Cancer Father        prostate cancer  . Heart Problems Father        pace maker 2018  . Breast cancer Neg Hx     Review of Systems  All other systems reviewed and  are negative.   Exam:   BP 92/60 (BP Location: Right Arm, Patient Position: Sitting, Cuff Size: Normal)   Pulse 80   Resp 16   Ht 5' 7.75" (1.721 m)   Wt 160 lb (72.6 kg)   LMP 03/20/2018   BMI 24.51 kg/m     General appearance: alert, cooperative and appears stated age Head: Normocephalic, without obvious abnormality, atraumatic Neck: no adenopathy, supple, symmetrical, trachea midline and thyroid normal to inspection and palpation Lungs: clear to auscultation bilaterally Breasts: normal appearance, no masses or tenderness, No nipple retraction or dimpling, No nipple discharge or bleeding, No axillary or supraclavicular adenopathy Heart: regular rate and rhythm Abdomen: soft, non-tender; no masses, no organomegaly Extremities: extremities normal, atraumatic, no cyanosis or edema Skin: Skin color, texture, turgor normal. No rashes or lesions Lymph nodes: Cervical, supraclavicular, and axillary nodes normal. No abnormal inguinal nodes palpated Neurologic: Grossly normal  Pelvic: External genitalia:  no lesions              Urethra:  normal appearing urethra with no masses, tenderness or lesions  Bartholins and Skenes: normal                 Vagina: normal appearing vagina with normal color and discharge, no lesions              Cervix: no lesions              Pap taken: No. Bimanual Exam:  Uterus:  normal size, contour, position, consistency, mobility, non-tender              Adnexa: no mass, fullness, tenderness              Rectal exam: Yes.    3 -4 mm nontender nodule at 11:00 anterior rectal wall.               Anus:  normal sphincter tone, no lesions  Chaperone was present for exam.  Assessment:   Well woman visit with normal exam. Menstrual change.  Hx HSV.  Decreased libido. Rectal nodule.  Plan: Mammogram screening. Recommended self breast awareness. Pap and HR HPV as above. Guidelines for Calcium, Vitamin D, regular exercise program including  cardiovascular and weight bearing exercise. Call if no menses for 3 months.  May do Provera challenge.  Refill of Valtrex to take prn.  Referral to GI.  Follow up annually and prn.   After visit summary provided.

## 2018-03-23 ENCOUNTER — Ambulatory Visit: Payer: BLUE CROSS/BLUE SHIELD | Admitting: Obstetrics and Gynecology

## 2018-03-23 ENCOUNTER — Encounter: Payer: Self-pay | Admitting: Obstetrics and Gynecology

## 2018-03-23 ENCOUNTER — Encounter: Payer: Self-pay | Admitting: Gastroenterology

## 2018-03-23 VITALS — BP 92/60 | HR 80 | Resp 16 | Ht 67.75 in | Wt 160.0 lb

## 2018-03-23 DIAGNOSIS — Z01419 Encounter for gynecological examination (general) (routine) without abnormal findings: Secondary | ICD-10-CM | POA: Diagnosis not present

## 2018-03-23 DIAGNOSIS — K6289 Other specified diseases of anus and rectum: Secondary | ICD-10-CM

## 2018-03-23 MED ORDER — VALACYCLOVIR HCL 500 MG PO TABS: 500.0000 mg | ORAL_TABLET | Freq: Two times a day (BID) | ORAL | 2 refills | Status: DC

## 2018-03-23 NOTE — Progress Notes (Signed)
Scheduled while in office for first available with Dr. Lavon PaganiniNandigam at [facility] for 05/17/18 at 1330. Agreeable to time/date/location. She is on cancellation list as well for an earlier appointment with Dr. Lavon PaganiniNandigam if available.

## 2018-03-23 NOTE — Patient Instructions (Signed)

## 2018-04-25 ENCOUNTER — Ambulatory Visit: Payer: BLUE CROSS/BLUE SHIELD | Admitting: Nurse Practitioner

## 2018-04-25 ENCOUNTER — Encounter: Payer: Self-pay | Admitting: Nurse Practitioner

## 2018-04-25 VITALS — BP 96/66 | HR 69 | Temp 98.1°F | Ht 67.75 in | Wt 160.0 lb

## 2018-04-25 DIAGNOSIS — R55 Syncope and collapse: Secondary | ICD-10-CM | POA: Diagnosis not present

## 2018-04-25 NOTE — Progress Notes (Signed)
Subjective:  Patient ID: Kristie Baldwin, female    DOB: [birth year]  Age: 47 y.o. MRN: [id number]  CC: Dizziness (patient complaining light of ligh headed,felt clammy,felt like Kristie Baldwin going ot black out but didnt, face felt hot. this started yesterday. )  Loss of Consciousness  This is a new problem. The current episode started yesterday. The problem occurs intermittently. The problem has been resolved. There was no loss of consciousness. The symptoms are aggravated by exertion and standing (dancing at outdoor event in [place]). Associated symptoms include diaphoresis, dizziness, light-headedness, malaise/fatigue and weakness. Pertinent negatives include no abdominal pain, auditory change, aura, back pain, bladder incontinence, bowel incontinence, chest pain, clumsiness, confusion, fever, focal sensory loss, focal weakness, headaches, nausea, palpitations, slurred speech, vertigo, visual change or vomiting. Kristie Baldwin has tried position change and sleep for the symptoms. The treatment provided moderate relief. There is no history of arrhythmia, CAD, a clotting disorder, CVA, DM, HTN, seizures, a sudden death in family, TIA or vertigo.   Reviewed past Medical, Social and Family history today.  Outpatient Medications Prior to Visit  Medication Sig Dispense Refill  . BIOTIN PO Take by mouth.    . Calcium Carb-Cholecalciferol (CALCIUM 1000 + D PO) Take by mouth.    . Cholecalciferol (VITAMIN D3 PO) Take by mouth.    . Cyanocobalamin (B-12 PO) Take by mouth.    . valACYclovir (VALTREX) 500 MG tablet Take 1 tablet (500 mg total) by mouth 2 (two) times daily. Take for 3 days prn. 30 tablet 2   No facility-administered medications prior to visit.     ROS See HPI  Objective:  BP 96/66   Pulse 69   Temp 98.1 F (36.7 C) (Oral)   Ht 5' 7.75" (1.721 m)   Wt 160 lb (72.6 kg)   LMP 04/15/2018   SpO2 96%   BMI 24.51 kg/m   BP Readings from Last 3 Encounters:  04/25/18 96/66  03/23/18 92/60    12/17/17 110/76    Wt Readings from Last 3 Encounters:  04/25/18 160 lb (72.6 kg)  03/23/18 160 lb (72.6 kg)  12/17/17 155 lb 12.8 oz (70.7 kg)    Physical Exam  Constitutional: Kristie Baldwin is oriented to person, place, and time. Kristie Baldwin appears well-developed and well-nourished.  HENT:  Right Ear: Tympanic membrane, external ear and ear canal normal.  Left Ear: Tympanic membrane, external ear and ear canal normal.  Eyes: Pupils are equal, round, and reactive to light. Conjunctivae and EOM are normal.  Neck: Normal range of motion. Neck supple.  Cardiovascular: Normal rate, regular rhythm, normal heart sounds and intact distal pulses.  Pulmonary/Chest: Effort normal and breath sounds normal.  Abdominal: Soft. Bowel sounds are normal.  Neurological: Kristie Baldwin is alert and oriented to person, place, and time. No cranial nerve deficit.  Vitals reviewed.   Lab Results  Component Value Date   WBC 9.7 04/25/2018   HGB 12.9 04/25/2018   HCT 39.0 04/25/2018   PLT 290.0 04/25/2018   GLUCOSE 84 04/25/2018   CHOL 156 12/17/2017   TRIG 45.0 12/17/2017   HDL 45.30 12/17/2017   LDLCALC 102 (H) 12/17/2017   ALT 16 12/17/2017   AST 17 12/17/2017   NA 136 04/25/2018   K 4.1 04/25/2018   CL 102 04/25/2018   CREATININE 0.84 04/25/2018   BUN 18 04/25/2018   CO2 25 04/25/2018   TSH 1.00 04/25/2018    Mm Screening Breast Tomo Bilateral  Result Date: 09/21/2017 CLINICAL DATA:  Screening. EXAM: DIGITAL  SCREENING BILATERAL MAMMOGRAM WITH TOMO AND CAD COMPARISON:  Previous exam(s). ACR Breast Density Category b: There are scattered areas of fibroglandular density. FINDINGS: There are no findings suspicious for malignancy. Images were processed with CAD. IMPRESSION: No mammographic evidence of malignancy. A result letter of this screening mammogram will be mailed directly to the patient. RECOMMENDATION: Screening mammogram in one year. (Code:SM-B-01Y) BI-RADS CATEGORY  1: Negative. Electronically Signed   By:  Sherian Rein M.D.   On: 09/21/2017 10:49   ECG: NSR, no abnormal ST segment and T wave.  Assessment & Plan:   Alveena was seen today for dizziness.  Diagnoses and all orders for this visit:  Pre-syncope -     EKG 12-Lead -     TSH -     CBC -     Basic metabolic panel -     Orthostatic vital signs   I am having Florene Route maintain Kristie Baldwin Cholecalciferol (VITAMIN D3 PO), Cyanocobalamin (B-12 PO), BIOTIN PO, valACYclovir, and Calcium Carb-Cholecalciferol (CALCIUM 1000 + D PO).  No orders of the defined types were placed in this encounter.   Follow-up: Return if symptoms worsen or fail to improve.  Alysia Penna, NP

## 2018-04-25 NOTE — Patient Instructions (Addendum)
Positive orthostatic. Push oral hydration.  Normal lab results.  Near-Syncope Near-syncope is when you suddenly become weak or dizzy, or you feel like you might pass out (faint). During an episode of near-syncope, you may:  Feel dizzy or light-headed.  Feel nauseous.  See all white or all black in your field of vision.  Have cold, clammy skin.  This condition is caused by a sudden decrease in blood flow to the brain. This decrease can result from various causes, but most of those causes are not dangerous. However, near-syncope can be a sign of a serious medical problem, so it is important to seek medical care. If you fainted, get medical help right away.Call your local emergency services (911 in the [place] Do not drive yourself to the hospital. Follow these instructions at home: Pay attention to any changes in your symptoms. Take these actions to help with your condition:  Have someone stay with you until you feel stable.  Do not drive, use machinery, or play sports until your health care provider says it is okay.  Keep all follow-up visits as told by your health care provider. This is important.  If you start to feel like you might faint, lie down right away and raise (elevate) your feet above the level of your heart. Breathe deeply and steadily. Wait until all of the symptoms have passed.  Drink enough fluid to keep your urine clear or pale yellow.  If you are taking blood pressure or heart medicine, get up slowly and take several minutes to sit and then stand. This can reduce dizziness.  Take over-the-counter and prescription medicines only as told by your health care provider.  Get help right away if:  You have a severe headache.  You have unusual pain in your chest, abdomen, or back.  You are bleeding from your mouth or rectum, or you have black or tarry stool.  You have a very fast or irregular heartbeat (palpitations).  You faint once or repeatedly.  You have a  seizure.  You are confused.  You have trouble walking.  You have severe weakness.  You have vision problems. These symptoms may represent a serious problem that is an emergency. Do not wait to see if your symptoms will go away. Get medical help right away. Call your local emergency services (911 in the [place] Do not drive yourself to the hospital. This information is not intended to replace advice given to you by your health care provider. Make sure you discuss any questions you have with your health care provider. Document Released: 07/27/2005 Document Revised: 01/02/2016 Document Reviewed: 04/10/2015 Elsevier Interactive Patient Education  2017 [employer]

## 2018-04-26 LAB — BASIC METABOLIC PANEL
BUN: 18 mg/dL (ref 6–23)
CO2: 25 mEq/L (ref 19–32)
Calcium: 9.3 mg/dL (ref 8.4–10.5)
Chloride: 102 mEq/L (ref 96–112)
Creatinine, Ser: 0.84 mg/dL (ref 0.40–1.20)
GFR: 76.65 mL/min (ref >60.00)
Glucose, Bld: 84 mg/dL (ref 70–99)
Potassium: 4.1 mEq/L (ref 3.5–5.1)
Sodium: 136 mEq/L (ref 135–145)

## 2018-04-26 LAB — CBC
HCT: 39 % (ref 36.0–46.0)
Hemoglobin: 12.9 g/dL (ref 12.0–15.0)
MCHC: 33.1 g/dL (ref 30.0–36.0)
MCV: 86.6 fl (ref 78.0–100.0)
Platelets: 290 10*3/uL (ref 150.0–400.0)
RBC: 4.5 Mil/uL (ref 3.87–5.11)
RDW: 13.4 % (ref 11.5–15.5)
WBC: 9.7 10*3/uL (ref 4.0–10.5)

## 2018-04-26 LAB — TSH: TSH: 1 u[IU]/mL (ref 0.35–4.50)

## 2018-04-27 ENCOUNTER — Encounter: Payer: Self-pay | Admitting: Nurse Practitioner

## 2018-05-17 ENCOUNTER — Encounter

## 2018-05-17 ENCOUNTER — Ambulatory Visit: Payer: BLUE CROSS/BLUE SHIELD | Admitting: Gastroenterology

## 2018-05-17 ENCOUNTER — Encounter: Payer: Self-pay | Admitting: Gastroenterology

## 2018-05-17 VITALS — BP 114/78 | HR 115 | Ht 68.0 in | Wt 162.0 lb

## 2018-05-17 DIAGNOSIS — K6289 Other specified diseases of anus and rectum: Secondary | ICD-10-CM | POA: Diagnosis not present

## 2018-05-17 NOTE — Patient Instructions (Addendum)
Call back if you have any rectal bleeding or bowel habit changes  We will put a recall colonoscopy in for you in 05/2019  If you are age 49 or older, your body mass index should be between 23-30. Your Body mass index is 24.63 kg/m. If this is out of the aforementioned range listed, please consider follow up with your Primary Care Provider.  If you are age 56 or younger, your body mass index should be between 19-25. Your Body mass index is 24.63 kg/m. If this is out of the aformentioned range listed, please consider follow up with your Primary Care Provider.    Thank you for choosing [facility]  Philbert Riser Nandigam,MD

## 2018-05-17 NOTE — Progress Notes (Signed)
Kristie Baldwin    [id number]    Nov 05, 1970  Primary Care Physician:Nche, Bonna Gains, NP  Referring Physician: Patton Salles, MD [address] [place], [place] [address]  Chief complaint: Rectal nodule HPI: 47 year old female here for new patient visit for evaluation of rectal nodule.  Patient had recent visit with gynecologist and was recommended to see GI for evaluation of rectal nodule that was palpated. She denies any change in bowel habits, rectal bleeding or rectal discomfort.  She had intentional 60 pound weight loss in the past year and half with phentermine, dietary changes and exercise No family history of colon cancer or GI malignancy. She never had a colonoscopy.    Outpatient Encounter Medications as of 05/17/2018  Medication Sig  . BIOTIN PO Take by mouth.  . Calcium Carb-Cholecalciferol (CALCIUM 1000 + D PO) Take by mouth.  . Cholecalciferol (VITAMIN D3 PO) Take by mouth.  . Cyanocobalamin (B-12 PO) Take by mouth.  . valACYclovir (VALTREX) 500 MG tablet Take 1 tablet (500 mg total) by mouth 2 (two) times daily. Take for 3 days prn.   No facility-administered encounter medications on file as of 05/17/2018.     Allergies as of 05/17/2018  . (No Known Allergies)    Past Medical History:  Diagnosis Date  . Depression   . Dysmenorrhea   . Peptic ulcer   . STD (sexually transmitted disease)    HSV    History reviewed. No pertinent surgical history.  Family History  Problem Relation Age of Onset  . Hypertension Mother   . Arthritis Mother        OA  . Atrial fibrillation Mother        medication  . Heart Problems Father        pace maker 2018  . Prostate cancer Father   . Breast cancer Neg Hx   . Colon cancer Neg Hx   . Stomach cancer Neg Hx   . Pancreatic cancer Neg Hx     Social History   Socioeconomic History  . Marital status: Married    Spouse name: Not on file  . Number of children: 0  .  Years of education: Not on file  . Highest education level: Not on file  Occupational History    Employer: [employer]  Social Needs  . Financial resource strain: Not on file  . Food insecurity:    Worry: Not on file    Inability: Not on file  . Transportation needs:    Medical: Not on file    Non-medical: Not on file  Tobacco Use  . Smoking status: Former Smoker    Years: 0.00    Last attempt to quit: 08/11/1991    Years since quitting: 26.7  . Smokeless tobacco: Never Used  Substance and Sexual Activity  . Alcohol use: Yes    Alcohol/week: 4.0 standard drinks    Types: 4 Standard drinks or equivalent per week    Comment: social  . Drug use: No  . Sexual activity: Not Currently    Partners: Male    Birth control/protection: Abstinence  Lifestyle  . Physical activity:    Days per week: Not on file    Minutes per session: Not on file  . Stress: Not on file  Relationships  . Social connections:    Talks on phone: Not on file    Gets together: Not on file  Attends religious service: Not on file    Active member of club or organization: Not on file    Attends meetings of clubs or organizations: Not on file    Relationship status: Not on file  . Intimate partner violence:    Fear of current or ex partner: Not on file    Emotionally abused: Not on file    Physically abused: Not on file    Forced sexual activity: Not on file  Other Topics Concern  . Not on file  Social History Narrative  . Not on file      Review of systems: Review of Systems  Constitutional: Negative for fever and chills.  HENT: Negative.   Eyes: Negative for blurred vision.  Respiratory: Negative for cough, shortness of breath and wheezing.   Cardiovascular: Negative for chest pain and palpitations.  Gastrointestinal: as per HPI Genitourinary: Negative for dysuria, urgency, frequency and hematuria.  Musculoskeletal: Negative for myalgias, back pain and joint pain.  Skin: Negative for  itching and rash.  Neurological: Negative for dizziness, tremors, focal weakness, seizures and loss of consciousness.  Endo/Heme/Allergies: Negative for seasonal allergies.  Psychiatric/Behavioral: Negative for depression, suicidal ideas and hallucinations.  All other systems reviewed and are negative.   Physical Exam: Vitals:   05/17/18 1330  BP: 114/78  Pulse: (!) 115   Body mass index is 24.63 kg/m. Gen:      No acute distress HEENT:  EOMI, sclera anicteric Neck:     No masses; no thyromegaly Lungs:    Clear to auscultation bilaterally; normal respiratory effort CV:         Regular rate and rhythm; no murmurs Abd:      + bowel sounds; soft, non-tender; no palpable masses, no distension Ext:    No edema; adequate peripheral perfusion Skin:      Warm and dry; no rash Neuro: alert and oriented x 3 Psych: normal mood and affect Rectal exam: Normal anal sphincter tone, no anal fissure or external hemorrhoids Anoscopy: Small internal hemorrhoids, no active bleeding, normal dentate line, prominent anal papilla and 2 to 3 mm benign-appearing polypoid lesion in the distal rectum.  Data Reviewed:  Reviewed labs, radiology imaging, old records and pertinent past GI work up   Assessment and Plan/Recommendations:  47 year old female here for further evaluation of rectal nodule palpated on annual well visit On anoscopy patient has a small 2 to 3 mm benign-appearing polypoid lesion in the distal rectum and prominent anal papilla. No other mucosal changes noted Given patient currently has no symptoms, will continue to monitor and schedule for colonoscopy for average risk colorectal cancer screening at age 9 Patient to call with any change in bowel habits, rectal bleeding, loss of appetite, abdominal pain or unintentional weight loss.    Iona Beard , MD [phone]    CC: Janean Sark*

## 2018-05-23 ENCOUNTER — Encounter: Payer: Self-pay | Admitting: Gastroenterology

## 2018-06-17 ENCOUNTER — Ambulatory Visit (INDEPENDENT_AMBULATORY_CARE_PROVIDER_SITE_OTHER): Payer: BLUE CROSS/BLUE SHIELD

## 2018-06-17 ENCOUNTER — Encounter: Payer: Self-pay | Admitting: Nurse Practitioner

## 2018-06-17 ENCOUNTER — Ambulatory Visit: Payer: BLUE CROSS/BLUE SHIELD | Admitting: Nurse Practitioner

## 2018-06-17 VITALS — BP 112/80 | HR 74 | Temp 97.7°F | Ht 68.0 in | Wt 159.0 lb

## 2018-06-17 DIAGNOSIS — R5381 Other malaise: Secondary | ICD-10-CM

## 2018-06-17 DIAGNOSIS — R61 Generalized hyperhidrosis: Secondary | ICD-10-CM

## 2018-06-17 DIAGNOSIS — R1013 Epigastric pain: Secondary | ICD-10-CM | POA: Diagnosis not present

## 2018-06-17 DIAGNOSIS — R5383 Other fatigue: Secondary | ICD-10-CM | POA: Diagnosis not present

## 2018-06-17 DIAGNOSIS — K920 Hematemesis: Secondary | ICD-10-CM

## 2018-06-17 DIAGNOSIS — J029 Acute pharyngitis, unspecified: Secondary | ICD-10-CM | POA: Diagnosis not present

## 2018-06-17 LAB — CBC WITH DIFFERENTIAL/PLATELET
Basophils Absolute: 0.1 10*3/uL (ref 0.0–0.1)
Basophils Relative: 1.2 % (ref 0.0–3.0)
Eosinophils Absolute: 0.1 10*3/uL (ref 0.0–0.7)
Eosinophils Relative: 2 % (ref 0.0–5.0)
HCT: 41.2 % (ref 36.0–46.0)
Hemoglobin: 13.8 g/dL (ref 12.0–15.0)
Lymphocytes Relative: 23.7 % (ref 12.0–46.0)
Lymphs Abs: 1.7 10*3/uL (ref 0.7–4.0)
MCHC: 33.6 g/dL (ref 30.0–36.0)
MCV: 85.8 fl (ref 78.0–100.0)
Monocytes Absolute: 0.4 10*3/uL (ref 0.1–1.0)
Monocytes Relative: 5.9 % (ref 3.0–12.0)
Neutro Abs: 4.9 10*3/uL (ref 1.4–7.7)
Neutrophils Relative %: 67.2 % (ref 43.0–77.0)
Platelets: 295 10*3/uL (ref 150.0–400.0)
RBC: 4.8 Mil/uL (ref 3.87–5.11)
RDW: 12.4 % (ref 11.5–15.5)
WBC: 7.4 10*3/uL (ref 4.0–10.5)

## 2018-06-17 LAB — HEPATIC FUNCTION PANEL
ALT: 19 U/L (ref 0–35)
AST: 21 U/L (ref 0–37)
Albumin: 4.7 g/dL (ref 3.5–5.2)
Alkaline Phosphatase: 44 U/L (ref 39–117)
Bilirubin, Direct: 0.1 mg/dL (ref 0.0–0.3)
Total Bilirubin: 0.4 mg/dL (ref 0.2–1.2)
Total Protein: 8 g/dL (ref 6.0–8.3)

## 2018-06-17 NOTE — Patient Instructions (Addendum)
Normal CXR. Negative hep A and B. FSH/LH and estrogen level indicates menopausal state. Hot flashes is probably related to hormonal changes. Symptoms can be managed by limiting caffeine, and alcohol, regular exercise, or non hormonal medication. Let me know if you will like to use effexor of paxil to manage symptoms.  Return iFOB kit to lab as discussed.  I think sore throat is due to use of heating systenm at home and sleeping with mouth open. Use cool mist humidifier at bedtime.

## 2018-06-17 NOTE — Progress Notes (Signed)
Subjective:  Patient ID: Kristie Baldwin, female    DOB: 25-Aug-1970  Age: 47 y.o. MRN: [id number]  CC: Sore Throat (pt is compliing of bad sore throat,going on 3 days. felt dry) and Hot Flashes (pt is complaining of hot flashes at night really bad,fatigue,unable to sleep. report of throwring up and diarrhea (10/26-10/27). )  Sore Throat   This is a new problem. The current episode started in the past 7 days. The problem has been unchanged. There has been no fever. Pertinent negatives include no congestion, coughing, ear pain, headaches, hoarse voice, plugged ear sensation, neck pain, shortness of breath, stridor, swollen glands or trouble swallowing. She has had no exposure to strep or mono. She has tried nothing for the symptoms.   Hot flashes, night sweats, malaise, fatigue, nausea, vomiting and diarrhea started 4weeks ago. Diarrhea and vomiting have resolved. Emesis was dark color. Diarrhea was watery but normal color. Married but not sexually active for several months. Gallbladder present, no indication of gall stones per [place] and [place] 2018. 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 Previous hep C and Hep B were negative, use of ETOH and acetaminophen sparingly. No recent travel.  Reviewed past Medical, Social and Family history today.  Outpatient Medications Prior to Visit  Medication Sig Dispense Refill  . BIOTIN PO Take by mouth.    . Calcium Carb-Cholecalciferol (CALCIUM 1000 + D PO) Take by mouth.    . Cholecalciferol (VITAMIN D3 PO) Take by mouth.    . Cyanocobalamin (B-12 PO) Take by mouth.    . valACYclovir (VALTREX) 500 MG tablet Take 1 tablet (500 mg total) by mouth 2 (two) times daily. Take for 3 days prn. 30 tablet 2   No facility-administered medications prior to visit.     ROS See HPI  Objective:  BP 112/80   Pulse 74   Temp 97.7 F (36.5 C) (Oral)   Ht 5\' 8"  (1.727 m)   Wt 159 lb (72.1 kg)   LMP 05/23/2018 (Exact Date)   SpO2 97%   BMI 24.18 kg/m   BP Readings from  Last 3 Encounters:  06/17/18 112/80  05/17/18 114/78  04/25/18 96/66    Wt Readings from Last 3 Encounters:  06/17/18 159 lb (72.1 kg)  05/17/18 162 lb (73.5 kg)  04/25/18 160 lb (72.6 kg)    Physical Exam  Constitutional: She is oriented to person, place, and time. She appears well-developed and well-nourished.  HENT:  Mouth/Throat: No oral lesions. No uvula swelling. Posterior oropharyngeal erythema present. No oropharyngeal exudate or posterior oropharyngeal edema.  Cardiovascular: Normal rate and regular rhythm.  Pulmonary/Chest: Effort normal and breath sounds normal.  Abdominal: Soft. Bowel sounds are normal. She exhibits no distension and no mass. There is no tenderness. There is no guarding.  Lymphadenopathy:    She has no cervical adenopathy.  Neurological: She is alert and oriented to person, place, and time.  Vitals reviewed.   Lab Results  Component Value Date   WBC 7.4 06/17/2018   HGB 13.8 06/17/2018   HCT 41.2 06/17/2018   PLT 295.0 06/17/2018   GLUCOSE 84 04/25/2018   CHOL 156 12/17/2017   TRIG 45.0 12/17/2017   HDL 45.30 12/17/2017   LDLCALC 102 (H) 12/17/2017   ALT 19 06/17/2018   AST 21 06/17/2018   NA 136 04/25/2018   K 4.1 04/25/2018   CL 102 04/25/2018   CREATININE 0.84 04/25/2018   BUN 18 04/25/2018   CO2 25 04/25/2018   TSH  1.00 04/25/2018    Mm Screening Breast Tomo Bilateral  Result Date: 09/21/2017 CLINICAL DATA:  Screening. EXAM: DIGITAL SCREENING BILATERAL MAMMOGRAM WITH TOMO AND CAD COMPARISON:  Previous exam(s). ACR Breast Density Category b: There are scattered areas of fibroglandular density. FINDINGS: There are no findings suspicious for malignancy. Images were processed with CAD. IMPRESSION: No mammographic evidence of malignancy. A result letter of this screening mammogram will be mailed directly to the patient. RECOMMENDATION: Screening mammogram in one year. (Code:SM-B-01Y) BI-RADS CATEGORY  1: Negative. Electronically Signed    By: Sherian Rein M.D.   On: 09/21/2017 10:49    Assessment & Plan:   Kristie Baldwin was seen today for sore throat and hot flashes.  Diagnoses and all orders for this visit:  Night sweats -     FSH/LH -     Estrogens, Total -     DG Chest 2 View  Malaise and fatigue -     FSH/LH -     Estrogens, Total -     DG Chest 2 View -     Hepatitis B Core Antibody, total -     Hepatitis A antibody, IgM  Dyspepsia -     Hepatic function panel -     CBC w/Diff -     Hepatitis B Core Antibody, total -     Hepatitis A antibody, IgM -     Fecal occult blood, imunochemical([facility]); Future -     Fecal occult blood, imunochemical([facility]); Future  Coffee ground emesis -     Cancel: Fecal occult blood, imunochemical([facility]); Future -     Cancel: Fecal occult blood, imunochemical([facility]); Future -     CBC w/Diff -     Hepatitis B Core Antibody, total -     Hepatitis A antibody, IgM -     Fecal occult blood, imunochemical([facility]); Future -     Fecal occult blood, imunochemical([facility]); Future   I am having Kristie Baldwin maintain her Cholecalciferol (VITAMIN D3 PO), Cyanocobalamin (B-12 PO), BIOTIN PO, valACYclovir, and Calcium Carb-Cholecalciferol (CALCIUM 1000 + D PO).  No orders of the defined types were placed in this encounter.   Follow-up: Return if symptoms worsen or fail to improve.  Alysia Penna, NP

## 2018-06-20 ENCOUNTER — Encounter: Payer: Self-pay | Admitting: Nurse Practitioner

## 2018-06-26 LAB — HEPATITIS B CORE ANTIBODY, TOTAL: Hep B Core Total Ab: NONREACTIVE

## 2018-06-26 LAB — ESTROGENS, TOTAL: Estrogen: 76.9 pg/mL

## 2018-06-26 LAB — FSH/LH
FSH: 181.6 m[IU]/mL — ABNORMAL HIGH
LH: 87.4 m[IU]/mL — ABNORMAL HIGH

## 2018-06-26 LAB — HEPATITIS A ANTIBODY, IGM: Hep A IgM: NONREACTIVE

## 2018-07-01 ENCOUNTER — Encounter: Payer: Self-pay | Admitting: Nurse Practitioner

## 2018-07-13 ENCOUNTER — Encounter: Payer: Self-pay | Admitting: Nurse Practitioner

## 2018-07-13 ENCOUNTER — Ambulatory Visit: Payer: BLUE CROSS/BLUE SHIELD | Admitting: Nurse Practitioner

## 2018-07-13 VITALS — BP 122/74 | HR 90 | Temp 98.4°F | Ht 68.0 in | Wt 158.0 lb

## 2018-07-13 DIAGNOSIS — R509 Fever, unspecified: Secondary | ICD-10-CM

## 2018-07-13 DIAGNOSIS — R6889 Other general symptoms and signs: Secondary | ICD-10-CM

## 2018-07-13 DIAGNOSIS — R51 Headache: Secondary | ICD-10-CM

## 2018-07-13 DIAGNOSIS — R0602 Shortness of breath: Secondary | ICD-10-CM | POA: Diagnosis not present

## 2018-07-13 DIAGNOSIS — R05 Cough: Secondary | ICD-10-CM

## 2018-07-13 DIAGNOSIS — R0981 Nasal congestion: Secondary | ICD-10-CM

## 2018-07-13 DIAGNOSIS — Z20828 Contact with and (suspected) exposure to other viral communicable diseases: Secondary | ICD-10-CM

## 2018-07-13 LAB — POC INFLUENZA A&B (BINAX/QUICKVUE)
Influenza A, POC: NEGATIVE
Influenza B, POC: NEGATIVE

## 2018-07-13 MED ORDER — OSELTAMIVIR PHOSPHATE 75 MG PO CAPS: 75.0000 mg | ORAL_CAPSULE | Freq: Two times a day (BID) | ORAL | 0 refills | Status: DC

## 2018-07-13 MED ORDER — CHLORPHEN-PE-ACETAMINOPHEN 4-10-325 MG PO TABS: 1.0000 | ORAL_TABLET | Freq: Two times a day (BID) | ORAL | 0 refills | Status: AC

## 2018-07-13 MED ORDER — HYDROCODONE-HOMATROPINE 5-1.5 MG/5ML PO SYRP: 5.0000 mL | ORAL_SOLUTION | Freq: Every evening | ORAL | 0 refills | Status: DC | PRN

## 2018-07-13 MED ORDER — DM-GUAIFENESIN ER 30-600 MG PO TB12: 1.0000 | ORAL_TABLET | Freq: Two times a day (BID) | ORAL | 0 refills | Status: DC | PRN

## 2018-07-13 NOTE — Progress Notes (Signed)
Subjective:  Patient ID: Kristie Baldwin, female    DOB: 04/28/1971  Age: 47 y.o. MRN: [id number]  CC: Cough (Has been ongoing for four days-dry cough. Has body aches-has been around coworkers that have been sick. Has been taking Tylenol and cough syrup. )  Cough  This is a new problem. The current episode started in the past 7 days. The problem has been unchanged. The cough is non-productive. Associated symptoms include chills, ear congestion, ear pain, a fever, headaches, nasal congestion, postnasal drip, rhinorrhea, a sore throat and shortness of breath. Pertinent negatives include no wheezing. The symptoms are aggravated by lying down. Risk factors for lung disease include occupational exposure. She has tried OTC cough suppressant for the symptoms. There is no history of asthma, bronchitis or environmental allergies.  direct exposure to coworker and nieces with flu.  Reviewed past Medical, Social and Family history today.  Outpatient Medications Prior to Visit  Medication Sig Dispense Refill  . BIOTIN PO Take by mouth.    . Calcium Carb-Cholecalciferol (CALCIUM 1000 + D PO) Take by mouth.    . Cholecalciferol (VITAMIN D3 PO) Take by mouth.    . Cyanocobalamin (B-12 PO) Take by mouth.    . valACYclovir (VALTREX) 500 MG tablet Take 1 tablet (500 mg total) by mouth 2 (two) times daily. Take for 3 days prn. 30 tablet 2   No facility-administered medications prior to visit.     ROS See HPI  Objective:  BP 122/74   Pulse 90   Temp 98.4 F (36.9 C) (Oral)   Ht 5\' 8"  (1.727 m)   Wt 158 lb (71.7 kg)   SpO2 96%   BMI 24.02 kg/m   BP Readings from Last 3 Encounters:  07/13/18 122/74  06/17/18 112/80  05/17/18 114/78    Wt Readings from Last 3 Encounters:  07/13/18 158 lb (71.7 kg)  06/17/18 159 lb (72.1 kg)  05/17/18 162 lb (73.5 kg)    Physical Exam  Constitutional: She is oriented to person, place, and time.  HENT:  Right Ear: Tympanic membrane, external ear and  ear canal normal.  Left Ear: Tympanic membrane, external ear and ear canal normal.  Nose: Mucosal edema and rhinorrhea present. Right sinus exhibits maxillary sinus tenderness. Right sinus exhibits no frontal sinus tenderness. Left sinus exhibits maxillary sinus tenderness. Left sinus exhibits no frontal sinus tenderness.  Mouth/Throat: Uvula is midline. No trismus in the jaw. Posterior oropharyngeal erythema present. No oropharyngeal exudate.  Eyes: No scleral icterus.  Neck: Normal range of motion. Neck supple.  Cardiovascular: Normal rate and normal heart sounds.  Pulmonary/Chest: Effort normal and breath sounds normal.  Musculoskeletal: She exhibits no edema.  Lymphadenopathy:    She has cervical adenopathy.  Neurological: She is alert and oriented to person, place, and time.  Vitals reviewed.   Lab Results  Component Value Date   WBC 7.4 06/17/2018   HGB 13.8 06/17/2018   HCT 41.2 06/17/2018   PLT 295.0 06/17/2018   GLUCOSE 84 04/25/2018   CHOL 156 12/17/2017   TRIG 45.0 12/17/2017   HDL 45.30 12/17/2017   LDLCALC 102 (H) 12/17/2017   ALT 19 06/17/2018   AST 21 06/17/2018   NA 136 04/25/2018   K 4.1 04/25/2018   CL 102 04/25/2018   CREATININE 0.84 04/25/2018   BUN 18 04/25/2018   CO2 25 04/25/2018   TSH 1.00 04/25/2018    Mm Screening Breast Tomo Bilateral  Result Date: 09/21/2017 CLINICAL DATA:  Screening. EXAM:  DIGITAL SCREENING BILATERAL MAMMOGRAM WITH TOMO AND CAD COMPARISON:  Previous exam(s). ACR Breast Density Category b: There are scattered areas of fibroglandular density. FINDINGS: There are no findings suspicious for malignancy. Images were processed with CAD. IMPRESSION: No mammographic evidence of malignancy. A result letter of this screening mammogram will be mailed directly to the patient. RECOMMENDATION: Screening mammogram in one year. (Code:SM-B-01Y) BI-RADS CATEGORY  1: Negative. Electronically Signed   By: Sherian ReinWei-Chen  Lin M.D.   On: 09/21/2017 10:49     Assessment & Plan:   Marylene Landngela was seen today for cough.  Diagnoses and all orders for this visit:  Flu-like symptoms -     POC Influenza A&B (Binax test) -     oseltamivir (TAMIFLU) 75 MG capsule; Take 1 capsule (75 mg total) by mouth 2 (two) times daily. -     dextromethorphan-guaiFENesin (MUCINEX DM) 30-600 MG 12hr tablet; Take 1 tablet by mouth 2 (two) times daily as needed for cough. -     Chlorphen-PE-Acetaminophen 4-10-325 MG TABS; Take 1 tablet by mouth every 12 (twelve) hours for 3 days. -     HYDROcodone-homatropine (HYCODAN) 5-1.5 MG/5ML syrup; Take 5 mLs by mouth at bedtime as needed for cough.  Exposure to influenza -     oseltamivir (TAMIFLU) 75 MG capsule; Take 1 capsule (75 mg total) by mouth 2 (two) times daily. -     dextromethorphan-guaiFENesin (MUCINEX DM) 30-600 MG 12hr tablet; Take 1 tablet by mouth 2 (two) times daily as needed for cough. -     Chlorphen-PE-Acetaminophen 4-10-325 MG TABS; Take 1 tablet by mouth every 12 (twelve) hours for 3 days. -     HYDROcodone-homatropine (HYCODAN) 5-1.5 MG/5ML syrup; Take 5 mLs by mouth at bedtime as needed for cough.   I am having Florene RouteAngela S. Paterson start on oseltamivir, dextromethorphan-guaiFENesin, Chlorphen-PE-Acetaminophen, and HYDROcodone-homatropine. I am also having her maintain her Cholecalciferol (VITAMIN D3 PO), Cyanocobalamin (B-12 PO), BIOTIN PO, valACYclovir, and Calcium Carb-Cholecalciferol (CALCIUM 1000 + D PO).  Meds ordered this encounter  Medications  . oseltamivir (TAMIFLU) 75 MG capsule    Sig: Take 1 capsule (75 mg total) by mouth 2 (two) times daily.    Dispense:  10 capsule    Refill:  0    Order Specific Question:   Supervising Provider    Answer:   MATTHEWS, CODY [[id number]]  . dextromethorphan-guaiFENesin (MUCINEX DM) 30-600 MG 12hr tablet    Sig: Take 1 tablet by mouth 2 (two) times daily as needed for cough.    Dispense:  14 tablet    Refill:  0    Order Specific Question:   Supervising Provider     Answer:   MATTHEWS, CODY [[id number]]  . Chlorphen-PE-Acetaminophen 4-10-325 MG TABS    Sig: Take 1 tablet by mouth every 12 (twelve) hours for 3 days.    Dispense:  6 tablet    Refill:  0    Order Specific Question:   Supervising Provider    Answer:   MATTHEWS, CODY [[id number]]  . HYDROcodone-homatropine (HYCODAN) 5-1.5 MG/5ML syrup    Sig: Take 5 mLs by mouth at bedtime as needed for cough.    Dispense:  60 mL    Refill:  0    Order Specific Question:   Supervising Provider    Answer:   MATTHEWS, CODY [[id number]]    Problem List Items Addressed This Visit    None    Visit Diagnoses    Flu-like symptoms    -  Primary  Relevant Medications   oseltamivir (TAMIFLU) 75 MG capsule   dextromethorphan-guaiFENesin (MUCINEX DM) 30-600 MG 12hr tablet   Chlorphen-PE-Acetaminophen 4-10-325 MG TABS   HYDROcodone-homatropine (HYCODAN) 5-1.5 MG/5ML syrup   Other Relevant Orders   POC Influenza A&B (Binax test) (Completed)   Exposure to influenza       Relevant Medications   oseltamivir (TAMIFLU) 75 MG capsule   dextromethorphan-guaiFENesin (MUCINEX DM) 30-600 MG 12hr tablet   Chlorphen-PE-Acetaminophen 4-10-325 MG TABS   HYDROcodone-homatropine (HYCODAN) 5-1.5 MG/5ML syrup       Follow-up: No follow-ups on file.  Alysia Penna, NP

## 2018-07-13 NOTE — Patient Instructions (Signed)
Maintain adequate oral hydration.  Upper Respiratory Infection, Adult Most upper respiratory infections (URIs) are caused by a virus. A URI affects the nose, throat, and upper air passages. The most common type of URI is often called "the common cold." Follow these instructions at home:  Take medicines only as told by your doctor.  Gargle warm saltwater or take cough drops to comfort your throat as told by your doctor.  Use a warm mist humidifier or inhale steam from a shower to increase air moisture. This may make it easier to breathe.  Drink enough fluid to keep your pee (urine) clear or pale yellow.  Eat soups and other clear broths.  Have a healthy diet.  Rest as needed.  Go back to work when your fever is gone or your doctor says it is okay. ? You may need to stay home longer to avoid giving your URI to others. ? You can also wear a face mask and wash your hands often to prevent spread of the virus.  Use your inhaler more if you have asthma.  Do not use any tobacco products, including cigarettes, chewing tobacco, or electronic cigarettes. If you need help quitting, ask your doctor. Contact a doctor if:  You are getting worse, not better.  Your symptoms are not helped by medicine.  You have chills.  You are getting more short of breath.  You have brown or red mucus.  You have yellow or brown discharge from your nose.  You have pain in your face, especially when you bend forward.  You have a fever.  You have puffy (swollen) neck glands.  You have pain while swallowing.  You have white areas in the back of your throat. Get help right away if:  You have very bad or constant: ? Headache. ? Ear pain. ? Pain in your forehead, behind your eyes, and over your cheekbones (sinus pain). ? Chest pain.  You have long-lasting (chronic) lung disease and any of the following: ? Wheezing. ? Long-lasting cough. ? Coughing up blood. ? A change in your usual  mucus.  You have a stiff neck.  You have changes in your: ? Vision. ? Hearing. ? Thinking. ? Mood. This information is not intended to replace advice given to you by your health care provider. Make sure you discuss any questions you have with your health care provider. Document Released: 01/13/2008 Document Revised: 03/29/2016 Document Reviewed: 11/01/2013 Elsevier Interactive Patient Education  2018 [employer]

## 2018-08-09 ENCOUNTER — Other Ambulatory Visit: Payer: Self-pay | Admitting: Obstetrics and Gynecology

## 2018-08-09 DIAGNOSIS — Z1231 Encounter for screening mammogram for malignant neoplasm of breast: Secondary | ICD-10-CM

## 2018-09-12 DIAGNOSIS — D229 Melanocytic nevi, unspecified: Secondary | ICD-10-CM | POA: Diagnosis not present

## 2018-09-12 DIAGNOSIS — L814 Other melanin hyperpigmentation: Secondary | ICD-10-CM | POA: Diagnosis not present

## 2018-09-12 DIAGNOSIS — D225 Melanocytic nevi of trunk: Secondary | ICD-10-CM | POA: Diagnosis not present

## 2018-09-12 DIAGNOSIS — D1801 Hemangioma of skin and subcutaneous tissue: Secondary | ICD-10-CM | POA: Diagnosis not present

## 2018-09-12 DIAGNOSIS — L570 Actinic keratosis: Secondary | ICD-10-CM | POA: Diagnosis not present

## 2018-09-21 ENCOUNTER — Ambulatory Visit
Admission: RE | Admit: 2018-09-21 | Discharge: 2018-09-21 | Disposition: A | Discharge status: Home / Self Care | Payer: BLUE CROSS/BLUE SHIELD | Source: Ambulatory Visit | Attending: Obstetrics and Gynecology | Admitting: Obstetrics and Gynecology

## 2018-09-21 DIAGNOSIS — Z1231 Encounter for screening mammogram for malignant neoplasm of breast: Secondary | ICD-10-CM | POA: Diagnosis not present

## 2018-10-06 ENCOUNTER — Encounter: Payer: Self-pay | Admitting: Family Medicine

## 2018-10-06 ENCOUNTER — Ambulatory Visit: Payer: BLUE CROSS/BLUE SHIELD | Admitting: Family Medicine

## 2018-10-06 DIAGNOSIS — J329 Chronic sinusitis, unspecified: Secondary | ICD-10-CM | POA: Insufficient documentation

## 2018-10-06 DIAGNOSIS — B9789 Other viral agents as the cause of diseases classified elsewhere: Secondary | ICD-10-CM

## 2018-10-06 NOTE — Progress Notes (Signed)
Kristie Baldwin - 48 y.o. female MRN [id number]  Date of birth: [birth year]  Subjective Chief Complaint  Patient presents with  . Cough    ongoing for three days-denies fevers- has some sinus congestion.    HPI  Kristie Baldwin is a 48 y.o. female who complains of coryza, congestion, sore throat, nasal blockage, post nasal drip, dry cough and bilateral sinus pain for 3 days. She denies a history of chest pain, chills, fevers, myalgias, nausea, shortness of breath, vomiting, weakness, wheezing and cough and denies a history of asthma. Patient does not smoke cigarettes.  She has tried tylenol without much improvement.   ROS:  A comprehensive ROS was completed and negative except as noted per HPI    No Known Allergies  Past Medical History:  Diagnosis Date  . Depression   . Dysmenorrhea   . Peptic ulcer   . STD (sexually transmitted disease)    HSV    No past surgical history on file.  Social History   Socioeconomic History  . Marital status: Married    Spouse name: Not on file  . Number of children: 0  . Years of education: Not on file  . Highest education level: Not on file  Occupational History    Employer: [employer]  Social Needs  . Financial resource strain: Not on file  . Food insecurity:    Worry: Not on file    Inability: Not on file  . Transportation needs:    Medical: Not on file    Non-medical: Not on file  Tobacco Use  . Smoking status: Former Smoker    Years: 0.00    Last attempt to quit: 08/11/1991    Years since quitting: 27.1  . Smokeless tobacco: Never Used  Substance and Sexual Activity  . Alcohol use: Yes    Alcohol/week: 4.0 standard drinks    Types: 4 Standard drinks or equivalent per week    Comment: social  . Drug use: No  . Sexual activity: Not Currently    Partners: Male    Birth control/protection: Abstinence  Lifestyle  . Physical activity:    Days per week: Not on file    Minutes per session: Not on file  . Stress:  Not on file  Relationships  . Social connections:    Talks on phone: Not on file    Gets together: Not on file    Attends religious service: Not on file    Active member of club or organization: Not on file    Attends meetings of clubs or organizations: Not on file    Relationship status: Not on file  Other Topics Concern  . Not on file  Social History Narrative  . Not on file    Family History  Problem Relation Age of Onset  . Hypertension Mother   . Arthritis Mother        OA  . Atrial fibrillation Mother        medication  . Heart Problems Father        pace maker 2018  . Prostate cancer Father   . Atrial fibrillation Father   . Breast cancer Neg Hx   . Colon cancer Neg Hx   . Stomach cancer Neg Hx   . Pancreatic cancer Neg Hx     Health Maintenance  Topic Date Due  . PAP SMEAR-Modifier  09/15/2018  . INFLUENZA VACCINE  09/16/2035 (Originally 03/10/2018)  . TETANUS/TDAP  08/26/2021  . HIV Screening  Completed    ----------------------------------------------------------------------------------------------------------------------------------------------------------------------------------------------------------------- Physical Exam BP 118/64   Pulse 94   Temp 97.9 F (36.6 C) (Oral)   Ht 5\' 8"  (1.727 m)   Wt 158 lb (71.7 kg)   SpO2 94%   BMI 24.02 kg/m   Physical Exam Constitutional:      Appearance: Normal appearance.  HENT:     Head: Normocephalic and atraumatic.     Right Ear: Tympanic membrane normal.     Left Ear: Tympanic membrane normal.     Nose: Congestion present.     Mouth/Throat:     Mouth: Mucous membranes are moist.  Eyes:     General: No scleral icterus. Neck:     Musculoskeletal: Neck supple.  Cardiovascular:     Rate and Rhythm: Normal rate and regular rhythm.  Pulmonary:     Effort: Pulmonary effort is normal.     Breath sounds: Normal breath sounds.  Skin:    General: Skin is warm and dry.  Neurological:     General: No  focal deficit present.     Mental Status: She is alert.  Psychiatric:        Mood and Affect: Mood normal.        Behavior: Behavior normal.     ------------------------------------------------------------------------------------------------------------------------------------------------------------------------------------------------------------------- Assessment and Plan  Viral sinusitis Symptomatic therapy suggested: push fluids, rest, use vaporizer or mist prn, apply heat to sinuses prn and return office visit prn if symptoms persist or worsen. Lack of antibiotic effectiveness discussed with her. Call or return to clinic prn if these symptoms worsen or fail to improve as anticipated.

## 2018-10-06 NOTE — Assessment & Plan Note (Signed)
Symptomatic therapy suggested: push fluids, rest, use vaporizer or mist prn, apply heat to sinuses prn and return office visit prn if symptoms persist or worsen. Lack of antibiotic effectiveness discussed with her. Call or return to clinic prn if these symptoms worsen or fail to improve as anticipated.

## 2018-10-06 NOTE — Patient Instructions (Signed)

## 2018-11-24 DIAGNOSIS — L820 Inflamed seborrheic keratosis: Secondary | ICD-10-CM | POA: Diagnosis not present

## 2018-12-19 ENCOUNTER — Encounter: Payer: BLUE CROSS/BLUE SHIELD | Admitting: Nurse Practitioner

## 2019-01-03 ENCOUNTER — Telehealth: Payer: BLUE CROSS/BLUE SHIELD | Admitting: Nurse Practitioner

## 2019-01-03 ENCOUNTER — Encounter: Payer: Self-pay | Admitting: Nurse Practitioner

## 2019-01-03 ENCOUNTER — Ambulatory Visit: Payer: Self-pay | Admitting: Nurse Practitioner

## 2019-01-03 VITALS — Temp 97.0°F | Ht 68.0 in | Wt 162.0 lb

## 2019-01-03 DIAGNOSIS — L02224 Furuncle of groin: Secondary | ICD-10-CM

## 2019-01-03 DIAGNOSIS — L02421 Furuncle of right axilla: Secondary | ICD-10-CM

## 2019-01-03 MED ORDER — CHLORHEXIDINE GLUCONATE 4 % EX LIQD: Freq: Every day | CUTANEOUS | 0 refills | Status: DC | PRN

## 2019-01-03 MED ORDER — SULFAMETHOXAZOLE-TRIMETHOPRIM 800-160 MG PO TABS: 1.0000 | ORAL_TABLET | Freq: Two times a day (BID) | ORAL | 0 refills | Status: DC

## 2019-01-03 NOTE — Patient Instructions (Signed)

## 2019-01-03 NOTE — Progress Notes (Unsigned)
Virtual Visit via Video Note  I connected with Kristie Baldwin on 01/03/19 at  1:30 PM EDT by a video enabled telemedicine application and verified that I am speaking with the correct person using two identifiers.  Location: Patient: private office Provider: office   I discussed the limitations of evaluation and management by telemedicine and the availability of in person appointments. The patient expressed understanding and agreed to proceed.  CC: pt is c/o of boil under right armpit,and upper thigh,irritation,painful and getting bigger--boil on upper thigh has watery discharge but under arm seem to be under skin. going on for 3 wks--has been using warm compress  History of Present Illness: Rash  This is a new problem. The current episode started 1 to 4 weeks ago. The problem is unchanged. The affected locations include the right axilla, right upper leg and groin. The rash is characterized by pain and redness. She was exposed to nothing. Pertinent negatives include no congestion, fatigue, fever or joint pain. Treatments tried: warm compress.  shaves with blade weekly   Observations/Objective: Physical Exam  Constitutional: She is oriented to person, place, and time. No distress.  Pulmonary/Chest: Effort normal.  Musculoskeletal: Normal range of motion.        General: No edema.     Right shoulder: She exhibits normal range of motion and no bony tenderness.     Right hip: She exhibits normal range of motion.  Neurological: She is alert and oriented to person, place, and time.  Skin: Rash noted. Rash is pustular.     Vitals reviewed.   Assessment and Plan: Kristie Baldwin was seen today for recurrent skin infections.  Diagnoses and all orders for this visit:  Furuncle of right axilla -     sulfamethoxazole-trimethoprim (BACTRIM DS) 800-160 MG tablet; Take 1 tablet by mouth 2 (two) times daily. -     chlorhexidine (HIBICLENS) 4 % external liquid; Apply topically daily as needed.   Furuncle of groin -     sulfamethoxazole-trimethoprim (BACTRIM DS) 800-160 MG tablet; Take 1 tablet by mouth 2 (two) times daily. -     chlorhexidine (HIBICLENS) 4 % external liquid; Apply topically daily as needed.    Follow Up Instructions: See discharge instructions   I discussed the assessment and treatment plan with the patient. The patient was provided an opportunity to ask questions and all were answered. The patient agreed with the plan and demonstrated an understanding of the instructions.   The patient was advised to call back or seek an in-person evaluation if the symptoms worsen or if the condition fails to improve as anticipated.   Alysia Penna, NP

## 2019-01-03 NOTE — Telephone Encounter (Signed)
  Pt. Reports she noticed 3 weeks ago two lumps under her right arm and one on her right thigh. All three are dime sized and have become very painful. No fever. Warm transfer to [facility] in the practice for a visit. Answer Assessment - Initial Assessment Questions 1. APPEARANCE of BOIL: "What does the boil look like?"      Red 2. LOCATION: "Where is the boil located?"      Under right 3. NUMBER: "How many boils are there?"      3 4. SIZE: "How big is the boil?" (e.g., inches, cm; compare to size of a coin or other object)     Under arm is dime size, leg dime size 5. ONSET: "When did the boil start?"     3 weeks ago 6. PAIN: "Is there any pain?" If so, ask: "How bad is the pain?"   (Scale 1-10; or mild, moderate, severe)     10 7. FEVER: "Do you have a fever?" If so, ask: "What is it, how was it measured, and when did it start?"      No 8. SOURCE: "Have you been around anyone with boils or other Staph infections?" "Have you ever had boils before?"     No 9. OTHER SYMPTOMS: "Do you have any other symptoms?" (e.g., shaking chills, weakness, rash elsewhere on body)     No 10. PREGNANCY: "Is there any chance you are pregnant?" "When was your last menstrual period?"       No  Protocols used: BOIL (SKIN ABSCESS)-A-AH

## 2019-01-13 ENCOUNTER — Encounter: Payer: Self-pay | Admitting: Nurse Practitioner

## 2019-01-20 ENCOUNTER — Telehealth: Payer: Self-pay | Admitting: Nurse Practitioner

## 2019-01-20 NOTE — Telephone Encounter (Signed)
Pt called saying that she has been talking to [place] through [place] about moving patient physical up in June 2020. I did let patient know that we will have to call her back the later part of next week to schedule this appointment. Patient is okay with this. Patient phone number is [phone]

## 2019-01-26 NOTE — Telephone Encounter (Signed)
I called and spoke to patient. Patient has been scheduled for a sooner appointment.

## 2019-02-07 ENCOUNTER — Other Ambulatory Visit: Payer: Self-pay

## 2019-02-07 ENCOUNTER — Encounter: Payer: Self-pay | Admitting: Nurse Practitioner

## 2019-02-07 ENCOUNTER — Ambulatory Visit (INDEPENDENT_AMBULATORY_CARE_PROVIDER_SITE_OTHER): Payer: BC Managed Care – PPO | Admitting: Nurse Practitioner

## 2019-02-07 VITALS — BP 110/72 | HR 86 | Temp 98.1°F | Ht 68.0 in | Wt 171.4 lb

## 2019-02-07 DIAGNOSIS — Z0001 Encounter for general adult medical examination with abnormal findings: Secondary | ICD-10-CM | POA: Diagnosis not present

## 2019-02-07 DIAGNOSIS — K760 Fatty (change of) liver, not elsewhere classified: Secondary | ICD-10-CM

## 2019-02-07 DIAGNOSIS — Z713 Dietary counseling and surveillance: Secondary | ICD-10-CM

## 2019-02-07 LAB — CBC
HCT: 38.1 % (ref 36.0–46.0)
Hemoglobin: 12.3 g/dL (ref 12.0–15.0)
MCHC: 32.3 g/dL (ref 30.0–36.0)
MCV: 87.4 fl (ref 78.0–100.0)
Platelets: 261 10*3/uL (ref 150.0–400.0)
RBC: 4.36 Mil/uL (ref 3.87–5.11)
RDW: 12.9 % (ref 11.5–15.5)
WBC: 5.9 10*3/uL (ref 4.0–10.5)

## 2019-02-07 LAB — LIPID PANEL
Cholesterol: 162 mg/dL (ref 0–200)
HDL: 57.1 mg/dL (ref >39.00)
LDL Cholesterol: 93 mg/dL (ref 0–99)
NonHDL: 104.7
Total CHOL/HDL Ratio: 3
Triglycerides: 60 mg/dL (ref 0.0–149.0)
VLDL: 12 mg/dL (ref 0.0–40.0)

## 2019-02-07 LAB — BASIC METABOLIC PANEL
BUN: 13 mg/dL (ref 6–23)
CO2: 28 mEq/L (ref 19–32)
Calcium: 9 mg/dL (ref 8.4–10.5)
Chloride: 103 mEq/L (ref 96–112)
Creatinine, Ser: 0.88 mg/dL (ref 0.40–1.20)
GFR: 68.12 mL/min (ref >60.00)
Glucose, Bld: 84 mg/dL (ref 70–99)
Potassium: 3.9 mEq/L (ref 3.5–5.1)
Sodium: 136 mEq/L (ref 135–145)

## 2019-02-07 LAB — HEPATIC FUNCTION PANEL
ALT: 18 U/L (ref 0–35)
AST: 23 U/L (ref 0–37)
Albumin: 4.2 g/dL (ref 3.5–5.2)
Alkaline Phosphatase: 42 U/L (ref 39–117)
Bilirubin, Direct: 0.1 mg/dL (ref 0.0–0.3)
Total Bilirubin: 0.4 mg/dL (ref 0.2–1.2)
Total Protein: 7 g/dL (ref 6.0–8.3)

## 2019-02-07 NOTE — Progress Notes (Signed)
Subjective:    Patient ID: Kristie Baldwin, female    DOB: 12-14-1970, [age] y.o.   MRN: [id number]  Patient presents today for complete physical and discuss weight gain  HPI  Sexual History (orientation,birth control, marital status, STD):marriend and sexually active  Depression/Suicide: [facility] screen [facility] 02/07/2019 12/17/2017 06/04/2017 12/15/2016 04/14/2016  Decreased Interest 0 0 0 0 0  Down, Depressed, Hopeless 0 0 0 0 0  PHQ - 2 Score 0 0 0 0 0   Vision:up to date  Dental:up to date  Immunizations: (TDAP, Hep C screen, Pneumovax, Influenza, zoster)  Health Maintenance  Topic Date Due  . Pap Smear  09/15/2018  . Flu Shot  09/16/2035*  . Tetanus Vaccine  08/26/2021  . HIV Screening  Completed  *Topic was postponed. The date shown is not the original due date.   Diet:heart healthy. Weight: concerned about continuous weight gain, denies any change in sleep, activity level and diet. Wt Readings from Last 3 Encounters:  02/07/19 171 lb 6.4 oz (77.7 kg)  01/03/19 162 lb (73.5 kg)  10/06/18 158 lb (71.7 kg)  Exercise:walking 30mins 4x/week  Fall Risk: Fall Risk  02/07/2019 12/17/2017 06/04/2017 12/15/2016 04/14/2016  Falls in the past year? 0 No No No No   Medications and allergies reviewed with patient and updated if appropriate.  Patient Active Problem List   Diagnosis Date Noted  . Weight loss counseling, encounter for 12/17/2017  . Obesity 06/04/2017  . NAFLD (nonalcoholic fatty liver disease) 57/84/696205/03/2017    Current Outpatient Medications on File Prior to Visit  Medication Sig Dispense Refill  . BIOTIN PO Take by mouth.    . Calcium Carb-Cholecalciferol (CALCIUM 1000 + D PO) Take by mouth.    . Cholecalciferol (VITAMIN D3 PO) Take by mouth.    . Cyanocobalamin (B-12 PO) Take by mouth.     No current facility-administered medications on file prior to visit.     Past Medical History:  Diagnosis Date  . Depression   . Dysmenorrhea   . Peptic ulcer   .  STD (sexually transmitted disease)    HSV    History reviewed. No pertinent surgical history.  Social History   Socioeconomic History  . Marital status: Married    Spouse name: Not on file  . Number of children: 0  . Years of education: Not on file  . Highest education level: Not on file  Occupational History    Employer: [employer]  Social Needs  . Financial resource strain: Not on file  . Food insecurity    Worry: Not on file    Inability: Not on file  . Transportation needs    Medical: Not on file    Non-medical: Not on file  Tobacco Use  . Smoking status: Former Smoker    Years: 0.00    Quit date: 08/11/1991    Years since quitting: 27.5  . Smokeless tobacco: Never Used  Substance and Sexual Activity  . Alcohol use: Yes    Alcohol/week: 4.0 standard drinks    Types: 4 Standard drinks or equivalent per week    Comment: social  . Drug use: No  . Sexual activity: Not Currently    Partners: Male    Birth control/protection: Abstinence  Lifestyle  . Physical activity    Days per week: Not on file    Minutes per session: Not on file  . Stress: Not on file  Relationships  . Social Musicianconnections    Talks on  phone: Not on file    Gets together: Not on file    Attends religious service: Not on file    Active member of club or organization: Not on file    Attends meetings of clubs or organizations: Not on file    Relationship status: Not on file  Other Topics Concern  . Not on file  Social History Narrative  . Not on file    Family History  Problem Relation Age of Onset  . Hypertension Mother   . Arthritis Mother        OA  . Atrial fibrillation Mother        medication  . Heart Problems Father        pace maker 2018  . Prostate cancer Father   . Atrial fibrillation Father   . Breast cancer Neg Hx   . Colon cancer Neg Hx   . Stomach cancer Neg Hx   . Pancreatic cancer Neg Hx         Review of Systems  Constitutional: Negative for fever,  malaise/fatigue and weight loss.  HENT: Negative for congestion and sore throat.   Eyes:       Negative for visual changes  Respiratory: Negative for cough and shortness of breath.   Cardiovascular: Negative for chest pain, palpitations and leg swelling.  Gastrointestinal: Negative for blood in stool, constipation, diarrhea and heartburn.  Genitourinary: Negative for dysuria, frequency and urgency.  Musculoskeletal: Negative for falls, joint pain and myalgias.  Skin: Negative for rash.  Neurological: Negative for dizziness, sensory change and headaches.  Endo/Heme/Allergies: Negative for polydipsia. Does not bruise/bleed easily.  Psychiatric/Behavioral: Negative for depression, substance abuse and suicidal ideas. The patient is not nervous/anxious.    Objective:   Vitals:   02/07/19 0844  BP: 110/72  Pulse: 86  Temp: 98.1 F (36.7 C)  SpO2: 99%   Body mass index is 26.06 kg/m.  Biometric measurements:  Neck: 13in Chest: 41in R. Arm: 11in L. Arm: 12in Waist: 38.8in Hip: 44.5in L. Thigh: 23in R. Thigh: 22in  Physical Examination:  Physical Exam Vitals signs reviewed.  Constitutional:      General: She is not in acute distress.    Appearance: She is well-developed.  HENT:     Right Ear: External ear normal.     Left Ear: External ear normal.     Nose: Nose normal.     Mouth/Throat:     Pharynx: No oropharyngeal exudate.  Eyes:     Conjunctiva/sclera: Conjunctivae normal.     Pupils: Pupils are equal, round, and reactive to light.  Cardiovascular:     Rate and Rhythm: Normal rate and regular rhythm.     Heart sounds: Normal heart sounds.  Pulmonary:     Effort: Pulmonary effort is normal. No respiratory distress.     Breath sounds: Normal breath sounds.  Chest:     Chest wall: No tenderness.  Abdominal:     General: Bowel sounds are normal.     Palpations: Abdomen is soft.     Tenderness: There is no abdominal tenderness.  Genitourinary:    Comments:  Deferred pelvic and breast exam to GYN Musculoskeletal: Normal range of motion.  Skin:    General: Skin is warm and dry.  Neurological:     Mental Status: She is alert and oriented to person, place, and time.     Deep Tendon Reflexes: Reflexes are normal and symmetric.  Psychiatric:        Mood and  Affect: Mood normal.        Behavior: Behavior normal.        Thought Content: Thought content normal.    ASSESSMENT and PLAN:  Kristie Landngela was seen today for annual exam.  Diagnoses and all orders for this visit:  Encounter for preventative adult health care exam with abnormal findings -     CBC -     Lipid panel -     Basic metabolic panel  NAFLD (nonalcoholic fatty liver disease) -     Hepatic function panel  Weight loss counseling, encounter for -     Amb Ref to Medical Weight Management   No problem-specific Assessment & Plan notes found for this encounter.     Problem List Items Addressed This Visit      Digestive   NAFLD (nonalcoholic fatty liver disease)   Relevant Orders   Hepatic function panel (Completed)     Other   Weight loss counseling, encounter for   Relevant Orders   Amb Ref to Medical Weight Management    Other Visit Diagnoses    Encounter for preventative adult health care exam with abnormal findings    -  Primary   Relevant Orders   CBC (Completed)   Lipid panel (Completed)   Basic metabolic panel (Completed)      Follow up: Return in about 4 weeks (around 03/07/2019) for weight loss management.  Alysia Pennaharlotte Rajanee Schuelke, NP

## 2019-02-07 NOTE — Patient Instructions (Addendum)
Neck: 13in Chest: 41in R. Arm: 11in L. Arm: 12in Waist: 38.8in Hip: 44.5in L. Thigh: 23in R. Thigh: 22in  Waist to hip ratio is 0.8 which indicates low risk for cardiovascular disease.  Go to lab for blood draw.  I will suggest referral to weight loss clinic instead of use of phentermine at this time. Let me know if you want referral entered  Have PAP results faxed to me.   Health Maintenance, Female Adopting a healthy lifestyle and getting preventive care are important in promoting health and wellness. Ask your health care provider about:  The right schedule for you to have regular tests and exams.  Things you can do on your own to prevent diseases and keep yourself healthy. What should I know about diet, weight, and exercise? Eat a healthy diet   Eat a diet that includes plenty of vegetables, fruits, low-fat dairy products, and lean protein.  Do not eat a lot of foods that are high in solid fats, added sugars, or sodium. Maintain a healthy weight Body mass index (BMI) is used to identify weight problems. It estimates body fat based on height and weight. Your health care provider can help determine your BMI and help you achieve or maintain a healthy weight. Get regular exercise Get regular exercise. This is one of the most important things you can do for your health. Most adults should:  Exercise for at least 150 minutes each week. The exercise should increase your heart rate and make you sweat (moderate-intensity exercise).  Do strengthening exercises at least twice a week. This is in addition to the moderate-intensity exercise.  Spend less time sitting. Even light physical activity can be beneficial. Watch cholesterol and blood lipids Have your blood tested for lipids and cholesterol at 50 years of age, then have this test every 5 years. Have your cholesterol levels checked more often if:  Your lipid or cholesterol levels are high.  You are older than 50 years of  age.  You are at high risk for heart disease. What should I know about cancer screening? Depending on your health history and family history, you may need to have cancer screening at various ages. This may include screening for:  Breast cancer.  Cervical cancer.  Colorectal cancer.  Skin cancer.  Lung cancer. What should I know about heart disease, diabetes, and high blood pressure? Blood pressure and heart disease  High blood pressure causes heart disease and increases the risk of stroke. This is more likely to develop in people who have high blood pressure readings, are of African descent, or are overweight.  Have your blood pressure checked: ? Every 3-5 years if you are 5-19 years of age. ? Every year if you are 44 years old or older. Diabetes Have regular diabetes screenings. This checks your fasting blood sugar level. Have the screening done:  Once every three years after age 53 if you are at a normal weight and have a low risk for diabetes.  More often and at a younger age if you are overweight or have a high risk for diabetes. What should I know about preventing infection? Hepatitis B If you have a higher risk for hepatitis B, you should be screened for this virus. Talk with your health care provider to find out if you are at risk for hepatitis B infection. Hepatitis C Testing is recommended for:  Everyone born from [birth year] through 1967.  Anyone with known risk factors for hepatitis C. Sexually transmitted infections (STIs)  Get screened for STIs, including gonorrhea and chlamydia, if: ? You are sexually active and are younger than 50 years of age. ? You are older than 50 years of age and your health care provider tells you that you are at risk for this type of infection. ? Your sexual activity has changed since you were last screened, and you are at increased risk for chlamydia or gonorrhea. Ask your health care provider if you are at risk.  Ask your health care  provider about whether you are at high risk for HIV. Your health care provider may recommend a prescription medicine to help prevent HIV infection. If you choose to take medicine to prevent HIV, you should first get tested for HIV. You should then be tested every 3 months for as long as you are taking the medicine. Pregnancy  If you are about to stop having your period (premenopausal) and you may become pregnant, seek counseling before you get pregnant.  Take 400 to 800 micrograms (mcg) of folic acid every day if you become pregnant.  Ask for birth control (contraception) if you want to prevent pregnancy. Osteoporosis and menopause Osteoporosis is a disease in which the bones lose minerals and strength with aging. This can result in bone fractures. If you are 50 years old or older, or if you are at risk for osteoporosis and fractures, ask your health care provider if you should:  Be screened for bone loss.  Take a calcium or vitamin D supplement to lower your risk of fractures.  Be given hormone replacement therapy (HRT) to treat symptoms of menopause. Follow these instructions at home: Lifestyle  Do not use any products that contain nicotine or tobacco, such as cigarettes, e-cigarettes, and chewing tobacco. If you need help quitting, ask your health care provider.  Do not use street drugs.  Do not share needles.  Ask your health care provider for help if you need support or information about quitting drugs. Alcohol use  Do not drink alcohol if: ? Your health care provider tells you not to drink. ? You are pregnant, may be pregnant, or are planning to become pregnant.  If you drink alcohol: ? Limit how much you use to 0-1 drink a day. ? Limit intake if you are breastfeeding.  Be aware of how much alcohol is in your drink. In the [place] one drink equals one 12 oz bottle of beer (355 mL), one 5 oz glass of wine (148 mL), or one 1 oz glass of hard liquor (44 mL). General  instructions  Schedule regular health, dental, and eye exams.  Stay current with your vaccines.  Tell your health care provider if: ? You often feel depressed. ? You have ever been abused or do not feel safe at home. Summary  Adopting a healthy lifestyle and getting preventive care are important in promoting health and wellness.  Follow your health care provider's instructions about healthy diet, exercising, and getting tested or screened for diseases.  Follow your health care provider's instructions on monitoring your cholesterol and blood pressure. This information is not intended to replace advice given to you by your health care provider. Make sure you discuss any questions you have with your health care provider. Document Released: 02/09/2011 Document Revised: 07/20/2018 Document Reviewed: 07/20/2018 Elsevier Patient Education  2020 [employer]

## 2019-02-21 DIAGNOSIS — L738 Other specified follicular disorders: Secondary | ICD-10-CM | POA: Diagnosis not present

## 2019-02-21 DIAGNOSIS — D485 Neoplasm of uncertain behavior of skin: Secondary | ICD-10-CM | POA: Diagnosis not present

## 2019-02-22 DIAGNOSIS — D225 Melanocytic nevi of trunk: Secondary | ICD-10-CM | POA: Diagnosis not present

## 2019-02-27 ENCOUNTER — Encounter: Payer: BLUE CROSS/BLUE SHIELD | Admitting: Nurse Practitioner

## 2019-03-07 ENCOUNTER — Ambulatory Visit: Payer: BC Managed Care – PPO | Admitting: Nurse Practitioner

## 2019-03-24 ENCOUNTER — Other Ambulatory Visit: Payer: Self-pay

## 2019-03-24 MED ORDER — VALACYCLOVIR HCL 500 MG PO TABS: 500.0000 mg | ORAL_TABLET | Freq: Two times a day (BID) | ORAL | 0 refills | Status: DC

## 2019-03-24 NOTE — Telephone Encounter (Signed)
Medication refill request: Valtrex  Last AEX:  03/23/18 Next AEX: 04/10/19 Last MMG (if hormonal medication request): 09/22/18 bi-rads 1 neg  Refill authorized: #30 with 0 RF

## 2019-04-10 ENCOUNTER — Ambulatory Visit: Payer: BLUE CROSS/BLUE SHIELD | Admitting: Obstetrics and Gynecology

## 2019-04-19 DIAGNOSIS — D3131 Benign neoplasm of right choroid: Secondary | ICD-10-CM | POA: Diagnosis not present

## 2019-04-19 DIAGNOSIS — H5213 Myopia, bilateral: Secondary | ICD-10-CM | POA: Diagnosis not present

## 2019-04-20 DIAGNOSIS — L910 Hypertrophic scar: Secondary | ICD-10-CM | POA: Diagnosis not present

## 2019-05-25 ENCOUNTER — Other Ambulatory Visit: Payer: Self-pay

## 2019-05-29 ENCOUNTER — Encounter: Payer: Self-pay | Admitting: Obstetrics and Gynecology

## 2019-05-29 ENCOUNTER — Ambulatory Visit: Payer: BC Managed Care – PPO | Admitting: Obstetrics and Gynecology

## 2019-05-29 ENCOUNTER — Other Ambulatory Visit: Payer: Self-pay

## 2019-05-29 VITALS — BP 108/60 | HR 76 | Temp 97.5°F | Resp 12 | Ht 67.5 in | Wt 165.0 lb

## 2019-05-29 DIAGNOSIS — Z1211 Encounter for screening for malignant neoplasm of colon: Secondary | ICD-10-CM | POA: Diagnosis not present

## 2019-05-29 DIAGNOSIS — Z01419 Encounter for gynecological examination (general) (routine) without abnormal findings: Secondary | ICD-10-CM

## 2019-05-29 MED ORDER — VALACYCLOVIR HCL 500 MG PO TABS: 500.0000 mg | ORAL_TABLET | Freq: Two times a day (BID) | ORAL | 2 refills | Status: DC

## 2019-05-29 NOTE — Progress Notes (Signed)
48 y.o. G38P0010 Married Caucasian female here for annual exam.    Having hot flashes.  No menses since June, 2020.  She wants to do this naturally.   PCP: Alysia Penna, NP    Patient's last menstrual period was 01/12/2019.           Sexually active: No.  The current method of family planning is abstinence.    Exercising: Yes.    walk, run, weights Smoker:  no  Health Maintenance: Pap:  09/16/15 Neg. HR HPV:Neg.  08/29/12 - negative.  Pap 2010 normal.  No HR HPV. History of abnormal Pap:  Yes, remote hx MMG:  09/21/18 BIRADS 1 negative/density b Colonoscopy:  never BMD:   n/a  Result  n/a TDaP:  08/27/11 Gardasil:   no HIV: negative in the past Hep C: negative in 2018 Screening Labs:  PCP   reports that she quit smoking about 27 years ago. She quit after 0.00 years of use. She has never used smokeless tobacco. She reports current alcohol use of about 4.0 standard drinks of alcohol per week. She reports that she does not use drugs.  Past Medical History:  Diagnosis Date  . Depression   . Dysmenorrhea   . Peptic ulcer   . STD (sexually transmitted disease)    HSV    History reviewed. No pertinent surgical history.  Current Outpatient Medications  Medication Sig Dispense Refill  . BIOTIN PO Take by mouth.    . Calcium Carb-Cholecalciferol (CALCIUM 1000 + D PO) Take by mouth.    . Cholecalciferol (VITAMIN D3 PO) Take by mouth.    . Cyanocobalamin (B-12 PO) Take by mouth.    . valACYclovir (VALTREX) 500 MG tablet Take 1 tablet (500 mg total) by mouth 2 (two) times daily. For 3 days as needed 30 tablet 0   No current facility-administered medications for this visit.     Family History  Problem Relation Age of Onset  . Hypertension Mother   . Arthritis Mother        OA  . Atrial fibrillation Mother        medication  . Heart Problems Father        pace maker 2018  . Prostate cancer Father   . Atrial fibrillation Father   . Breast cancer Neg Hx   . Colon cancer Neg  Hx   . Stomach cancer Neg Hx   . Pancreatic cancer Neg Hx     Review of Systems  Constitutional:       Hot flashes Mood swings  HENT: Negative.   Eyes: Negative.   Respiratory: Negative.   Cardiovascular: Negative.   Gastrointestinal: Negative.   Endocrine: Negative.   Genitourinary: Negative.   Musculoskeletal: Negative.   Skin: Negative.   Allergic/Immunologic: Negative.   Neurological: Negative.   Hematological: Negative.   Psychiatric/Behavioral: Negative.     Exam:   BP 108/60 (BP Location: Left Arm, Patient Position: Sitting, Cuff Size: Normal)   Pulse 76   Temp (!) 97.5 F (36.4 C) (Temporal)   Resp 12   Ht 5' 7.5" (1.715 m)   Wt 165 lb (74.8 kg)   LMP 01/12/2019   BMI 25.46 kg/m     General appearance: alert, cooperative and appears stated age Head: normocephalic, without obvious abnormality, atraumatic Neck: no adenopathy, supple, symmetrical, trachea midline and thyroid normal to inspection and palpation Lungs: clear to auscultation bilaterally Breasts: normal appearance, no masses or tenderness, No nipple retraction or dimpling, No nipple discharge  or bleeding, No axillary adenopathy Heart: regular rate and rhythm Abdomen: soft, non-tender; no masses, no organomegaly Extremities: extremities normal, atraumatic, no cyanosis or edema Skin: skin color, texture, turgor normal. No rashes or lesions Lymph nodes: cervical, supraclavicular, and axillary nodes normal. Neurologic: grossly normal  Pelvic: External genitalia:  no lesions              No abnormal inguinal nodes palpated.              Urethra:  normal appearing urethra with no masses, tenderness or lesions              Bartholins and Skenes: normal                 Vagina: normal appearing vagina with normal color and discharge, no lesions              Cervix: no lesions              Pap taken: No. Bimanual Exam:  Uterus:  normal size, contour, position, consistency, mobility, non-tender               Adnexa: no mass, fullness, tenderness              Rectal exam: Yes.  .  Confirms.              Anus:  normal sphincter tone,  4 mm soft nodule at 12:00 inside rectal vault.  Chaperone was present for exam.  Assessment:   Well woman visit with normal exam. Perimenopausal female.  Vasomotor symptoms.  Remote hx of abnormal pap.  Hx rectal nodule. Saw GI.  Hx HSV.   Plan: Mammogram screening discussed. Self breast awareness reviewed. Pap and HR HPV in 2022.  5 year risk of CIN 3 is 0.12%.  Guidelines for Calcium, Vitamin D, regular exercise program including cardiovascular and weight bearing exercise. Referral for colon cancer screening and recheck of rectal nodule.  Flu vaccine recommended.  Rx for Valtrex.  She will take only as needed at this time.  Brochure on menopause.  We discussed menopause in detail.  She will try [facility] for now.  Follow up annually and prn.   After visit summary provided.

## 2019-05-29 NOTE — Patient Instructions (Signed)

## 2019-05-31 DIAGNOSIS — M25531 Pain in right wrist: Secondary | ICD-10-CM | POA: Diagnosis not present

## 2019-06-02 ENCOUNTER — Encounter: Payer: Self-pay | Admitting: Gastroenterology

## 2019-06-06 DIAGNOSIS — L0291 Cutaneous abscess, unspecified: Secondary | ICD-10-CM | POA: Diagnosis not present

## 2019-06-30 DIAGNOSIS — M25531 Pain in right wrist: Secondary | ICD-10-CM | POA: Diagnosis not present

## 2019-07-12 DIAGNOSIS — M79652 Pain in left thigh: Secondary | ICD-10-CM | POA: Diagnosis not present

## 2019-07-12 DIAGNOSIS — M545 Low back pain: Secondary | ICD-10-CM | POA: Diagnosis not present

## 2019-08-01 DIAGNOSIS — S0501XA Injury of conjunctiva and corneal abrasion without foreign body, right eye, initial encounter: Secondary | ICD-10-CM | POA: Diagnosis not present

## 2019-08-08 DIAGNOSIS — L700 Acne vulgaris: Secondary | ICD-10-CM | POA: Diagnosis not present

## 2019-08-08 DIAGNOSIS — L720 Epidermal cyst: Secondary | ICD-10-CM | POA: Diagnosis not present

## 2019-08-08 DIAGNOSIS — Z22322 Carrier or suspected carrier of Methicillin resistant Staphylococcus aureus: Secondary | ICD-10-CM | POA: Diagnosis not present

## 2019-08-08 DIAGNOSIS — L910 Hypertrophic scar: Secondary | ICD-10-CM | POA: Diagnosis not present

## 2019-08-10 ENCOUNTER — Other Ambulatory Visit: Payer: Self-pay | Admitting: Obstetrics and Gynecology

## 2019-08-10 ENCOUNTER — Other Ambulatory Visit: Payer: Self-pay

## 2019-08-10 ENCOUNTER — Encounter: Payer: Self-pay | Admitting: Obstetrics and Gynecology

## 2019-08-10 ENCOUNTER — Ambulatory Visit: Payer: BC Managed Care – PPO | Admitting: Obstetrics and Gynecology

## 2019-08-10 VITALS — BP 104/70 | HR 80 | Temp 97.0°F | Ht 67.5 in | Wt 167.2 lb

## 2019-08-10 DIAGNOSIS — N760 Acute vaginitis: Secondary | ICD-10-CM

## 2019-08-10 DIAGNOSIS — B379 Candidiasis, unspecified: Secondary | ICD-10-CM

## 2019-08-10 MED ORDER — NYSTATIN-TRIAMCINOLONE 100000-0.1 UNIT/GM-% EX CREA: 1.0000 "application " | TOPICAL_CREAM | Freq: Two times a day (BID) | CUTANEOUS | 0 refills | Status: DC

## 2019-08-10 MED ORDER — FLUCONAZOLE 150 MG PO TABS: 150.0000 mg | ORAL_TABLET | Freq: Once | ORAL | 0 refills | Status: AC

## 2019-08-10 NOTE — Telephone Encounter (Signed)
Spoke to pt. Pt states having Rx for Cefdinir from dermatologist that she started 2 days ago. Pt wanting Diflucan to help cover for having a possible yeast infection with taking the abx for 30 days. Pt states always sensitive to abx and gets yeast infection after. Pt denies calling dermatologist for Rx.   Will route to Dr Quincy Simmonds for review and possible Rx for Diflucan. Orders pended if approved. #2, 0RF.

## 2019-08-10 NOTE — Patient Instructions (Signed)
Vaginitis Vaginitis is a condition in which the vaginal tissue swells and becomes red (inflamed). This condition is most often caused by a change in the normal balance of bacteria and yeast that live in the vagina. This change causes an overgrowth of certain bacteria or yeast, which causes the inflammation. There are different types of vaginitis, but the most common types are:  Bacterial vaginosis.  Yeast infection (candidiasis).  Trichomoniasis vaginitis. This is a sexually transmitted disease (STD).  Viral vaginitis.  Atrophic vaginitis.  Allergic vaginitis. What are the causes? The cause of this condition depends on the type of vaginitis. It can be caused by:  Bacteria (bacterial vaginosis).  Yeast, which is a fungus (yeast infection).  A parasite (trichomoniasis vaginitis).  A virus (viral vaginitis).  Low hormone levels (atrophic vaginitis). Low hormone levels can occur during pregnancy, breastfeeding, or after menopause.  Irritants, such as bubble baths, scented tampons, and feminine sprays (allergic vaginitis). Other factors can change the normal balance of the yeast and bacteria that live in the vagina. These include:  Antibiotic medicines.  Poor hygiene.  Diaphragms, vaginal sponges, spermicides, birth control pills, and intrauterine devices (IUD).  Sex.  Infection.  Uncontrolled diabetes.  A weakened defense (immune) system. What increases the risk? This condition is more likely to develop in women who:  Smoke.  Use vaginal douches, scented tampons, or scented sanitary pads.  Wear tight-fitting pants.  Wear thong underwear.  Use oral birth control pills or an IUD.  Have sex without a condom.  Have multiple sex partners.  Have an STD.  Frequently use the spermicide nonoxynol-9.  Eat lots of foods high in sugar.  Have uncontrolled diabetes.  Have low estrogen levels.  Have a weakened immune system from an immune disorder or medical  treatment.  Are pregnant or breastfeeding. What are the signs or symptoms? Symptoms vary depending on the cause of the vaginitis. Common symptoms include:  Abnormal vaginal discharge. ? The discharge is white, gray, or yellow with bacterial vaginosis. ? The discharge is thick, white, and cheesy with a yeast infection. ? The discharge is frothy and yellow or greenish with trichomoniasis.  A bad vaginal smell. The smell is fishy with bacterial vaginosis.  Vaginal itching, pain, or swelling.  Sex that is painful.  Pain or burning when urinating. Sometimes there are no symptoms. How is this diagnosed? This condition is diagnosed based on your symptoms and medical history. A physical exam, including a pelvic exam, will also be done. You may also have other tests, including:  Tests to determine the pH level (acidity or alkalinity) of your vagina.  A whiff test, to assess the odor that results when a sample of your vaginal discharge is mixed with a potassium hydroxide solution.  Tests of vaginal fluid. A sample will be examined under a microscope. How is this treated? Treatment varies depending on the type of vaginitis you have. Your treatment may include:  Antibiotic creams or pills to treat bacterial vaginosis and trichomoniasis.  Antifungal medicines, such as vaginal creams or suppositories, to treat a yeast infection.  Medicine to ease discomfort if you have viral vaginitis. Your sexual partner should also be treated.  Estrogen delivered in a cream, pill, suppository, or vaginal ring to treat atrophic vaginitis. If vaginal dryness occurs, lubricants and moisturizing creams may help. You may need to avoid scented soaps, sprays, or douches.  Stopping use of a product that is causing allergic vaginitis. Then using a vaginal cream to treat the symptoms. Follow   these instructions at home: Lifestyle  Keep your genital area clean and dry. Avoid soap, and only rinse the area with  water.  Do not douche or use tampons until your health care provider says it is okay to do so. Use sanitary pads, if needed.  Do not have sex until your health care provider approves. When you can return to sex, practice safe sex and use condoms.  Wipe from front to back. This avoids the spread of bacteria from the rectum to the vagina. General instructions  Take over-the-counter and prescription medicines only as told by your health care provider.  If you were prescribed an antibiotic medicine, take or use it as told by your health care provider. Do not stop taking or using the antibiotic even if you start to feel better.  Keep all follow-up visits as told by your health care provider. This is important. How is this prevented?  Use mild, non-scented products. Do not use things that can irritate the vagina, such as fabric softeners. Avoid the following products if they are scented: ? Feminine sprays. ? Detergents. ? Tampons. ? Feminine hygiene products. ? Soaps or bubble baths.  Let air reach your genital area. ? Wear cotton underwear to reduce moisture buildup. ? Avoid wearing underwear while you sleep. ? Avoid wearing tight pants and underwear or nylons without a cotton panel. ? Avoid wearing thong underwear.  Take off any wet clothing, such as bathing suits, as soon as possible.  Practice safe sex and use condoms. Contact a health care provider if:  You have abdominal pain.  You have a fever.  You have symptoms that last for more than 2-3 days. Get help right away if:  You have a fever and your symptoms suddenly get worse. Summary  Vaginitis is a condition in which the vaginal tissue becomes inflamed.This condition is most often caused by a change in the normal balance of bacteria and yeast that live in the vagina.  Treatment varies depending on the type of vaginitis you have.  Do not douche, use tampons , or have sex until your health care provider approves. When  you can return to sex, practice safe sex and use condoms. This information is not intended to replace advice given to you by your health care provider. Make sure you discuss any questions you have with your health care provider. Document Revised: 07/09/2017 Document Reviewed: 09/01/2016 Elsevier Patient Education  2020 Elsevier Inc.  

## 2019-08-10 NOTE — Progress Notes (Signed)
GYNECOLOGY  VISIT   HPI: 48 y.o.   Married  Caucasian  female   G1P0010 with Patient's last menstrual period was 07/21/2019 (exact date).   here for vaginal itching and irritation.  Started an abx through her dermatologist 2 days ago.  She has some vaginal irritation and dryness.  She is worried about infection.  Able to use Diflucan.  Monistat causes burning.   She is being treated for a skin lesion on her forehead with drainage and now cystic acne on her nose.  She had a nasal swab for ?MRSA? She was started on Omnicef which she will continue for 30 days.   Taking Estroven and feel like it changed her life. She is having some hot flashes  GYNECOLOGIC HISTORY: Patient's last menstrual period was 07/21/2019 (exact date). Contraception: Abstinence Menopausal hormone therapy:  none Last mammogram:  09/21/18 BIRADS 1 negative/density b Last pap smear: 09-16-15 Neg:neg HR HPV, 08-29-12 Neg, 2010 Neg        OB History    Gravida  1   Para  0   Term  0   Preterm  0   AB  1   Living  0     SAB  0   TAB  1   Ectopic  0   Multiple  0   Live Births  0              Patient Active Problem List   Diagnosis Date Noted  . Weight loss counseling, encounter for 12/17/2017  . Obesity 06/04/2017  . NAFLD (nonalcoholic fatty liver disease) 05/02/3006    Past Medical History:  Diagnosis Date  . Depression   . Dysmenorrhea   . Peptic ulcer   . STD (sexually transmitted disease)    HSV    History reviewed. No pertinent surgical history.  Current Outpatient Medications  Medication Sig Dispense Refill  . BIOTIN PO Take by mouth.    . Calcium Carb-Cholecalciferol (CALCIUM 1000 + D PO) Take by mouth.    . cefdinir (OMNICEF) 300 MG capsule Take 300 mg by mouth 2 (two) times daily.    . Cholecalciferol (VITAMIN D3 PO) Take by mouth.    . clindamycin (CLEOCIN T) 1 % external solution SMARTSIG:1 Sparingly Topical Twice Daily    . Cyanocobalamin (B-12 PO) Take by mouth.     . valACYclovir (VALTREX) 500 MG tablet Take 1 tablet (500 mg total) by mouth 2 (two) times daily. For 3 days as needed 30 tablet 2   No current facility-administered medications for this visit.     ALLERGIES: Patient has no known allergies.  Family History  Problem Relation Age of Onset  . Hypertension Mother   . Arthritis Mother        OA  . Atrial fibrillation Mother        medication  . Heart Problems Father        pace maker 2018  . Prostate cancer Father   . Atrial fibrillation Father   . Breast cancer Neg Hx   . Colon cancer Neg Hx   . Stomach cancer Neg Hx   . Pancreatic cancer Neg Hx     Social History   Socioeconomic History  . Marital status: Married    Spouse name: Not on file  . Number of children: 0  . Years of education: Not on file  . Highest education level: Not on file  Occupational History    Employer: [employer]  Tobacco Use  .  Smoking status: Former Smoker    Years: 0.00    Quit date: 08/11/1991    Years since quitting: 28.0  . Smokeless tobacco: Never Used  Substance and Sexual Activity  . Alcohol use: Yes    Alcohol/week: 4.0 standard drinks    Types: 4 Standard drinks or equivalent per week    Comment: social  . Drug use: No  . Sexual activity: Not Currently    Partners: Male    Birth control/protection: Abstinence  Other Topics Concern  . Not on file  Social History Narrative  . Not on file   Social Determinants of Health   Financial Resource Strain:   . Difficulty of Paying Living Expenses: Not on file  Food Insecurity:   . Worried About Charity fundraiser in the Last Year: Not on file  . Ran Out of Food in the Last Year: Not on file  Transportation Needs:   . Lack of Transportation (Medical): Not on file  . Lack of Transportation (Non-Medical): Not on file  Physical Activity:   . Days of Exercise per Week: Not on file  . Minutes of Exercise per Session: Not on file  Stress:   . Feeling of Stress : Not on file  Social  Connections:   . Frequency of Communication with Friends and Family: Not on file  . Frequency of Social Gatherings with Friends and Family: Not on file  . Attends Religious Services: Not on file  . Active Member of Clubs or Organizations: Not on file  . Attends Archivist Meetings: Not on file  . Marital Status: Not on file  Intimate Partner Violence:   . Fear of Current or Ex-Partner: Not on file  . Emotionally Abused: Not on file  . Physically Abused: Not on file  . Sexually Abused: Not on file    Review of Systems  All other systems reviewed and are negative.   PHYSICAL EXAMINATION:    BP 104/70   Pulse 80   Temp (!) 97 F (36.1 C) (Temporal)   Ht 5' 7.5" (1.715 m)   Wt 167 lb 3.2 oz (75.8 kg)   LMP 07/21/2019 (Exact Date)   BMI 25.80 kg/m     General appearance: alert, cooperative and appears stated age   Pelvic: External genitalia: erythema of the vulva.               Urethra:  normal appearing urethra with no masses, tenderness or lesions              Bartholins and Skenes: normal                 Vagina: normal appearing vagina with normal color and discharge, no lesions              Cervix: no lesions                Bimanual Exam:  Uterus:  normal size, contour, position, consistency, mobility, non-tender              Adnexa: no mass, fullness, tenderness         Chaperone was present for exam.  ASSESSMENT   Vulvovaginitis.  Recent initiation of Omnicef.  Perimenopausal female.  PLAN  Affirm.  Mycolog II and Diflucan.  She will take probiotics.  She will call her dermatologist if she is unable to tolerate her abx.   An After Visit Summary was printed and given to the patient.  __15____ minutes face  to face time of which over 50% was spent in counseling.

## 2019-08-10 NOTE — Telephone Encounter (Signed)
Office visit is recommended for prescription treatment of vaginitis.  If she is unable to come in for a visit, she can try over the counter Monistat.

## 2019-08-10 NOTE — Telephone Encounter (Signed)
Patient sent the following message through [place]. Routing to triage to assist patient with request.  Samella, [facility]  Phone Number: [phone]  Hi! My dermatologist at [facility], has prescribed Cefdinir capsules 300 mg twice a day for 30 days recently. I am worried about getting a yeast infection. Could I get something on stand by or would you want me to come into the office if I start having issues. It does feel a little irritated this morning and I am only on day 3.    Any suggestions would be very helpful.  thank you!  Kristie Baldwin

## 2019-08-10 NOTE — Telephone Encounter (Signed)
Spoke back with pt. Pt agreeable to come for office visit. Scheduled OV today at 4:30pm with Dr Quincy Simmonds. CPS negative. Pt states cannot take Monistat because it causes "burning" when used.   Will route to Dr Quincy Simmonds for review and will close encounter.

## 2019-08-10 NOTE — Telephone Encounter (Signed)
Left message for pt to call back to office for appt.

## 2019-08-11 LAB — VAGINITIS/VAGINOSIS, DNA PROBE
Candida Species: NEGATIVE
Gardnerella vaginalis: POSITIVE — AB
Trichomonas vaginosis: NEGATIVE

## 2019-08-14 ENCOUNTER — Telehealth: Payer: Self-pay | Admitting: *Deleted

## 2019-08-14 NOTE — Telephone Encounter (Signed)
Leda Min, RN  08/14/2019 11:38 AM EST    Left message to call Noreene Larsson, RN at [facility] [phone].

## 2019-08-14 NOTE — Telephone Encounter (Signed)
-----   Message from Patton Salles, MD sent at 08/13/2019  7:15 PM EST ----- Please inform of Affirm result showing bacterial vaginosis. She may treat with Flagyl 500 mg po bid for 7 days or Metrogel pv at hs for 5 nights.  Please send Rx to pharmacy of choice. ETOH precautions.

## 2019-08-15 MED ORDER — METRONIDAZOLE 0.75 % VA GEL: 1.0000 | Freq: Every day | VAGINAL | 0 refills | Status: AC

## 2019-08-15 NOTE — Telephone Encounter (Signed)
Patient returned call

## 2019-08-15 NOTE — Telephone Encounter (Signed)
Spoke with patient, advised per Dr. Edward Jolly. ETOH precautions reviewed. Rx for metrogel to verified pharmacy. Patient verbalizes understanding and is agreeable.   Encounter closed.

## 2019-10-02 ENCOUNTER — Other Ambulatory Visit: Payer: Self-pay | Admitting: Obstetrics and Gynecology

## 2019-10-02 DIAGNOSIS — Z1231 Encounter for screening mammogram for malignant neoplasm of breast: Secondary | ICD-10-CM

## 2019-10-09 DIAGNOSIS — L814 Other melanin hyperpigmentation: Secondary | ICD-10-CM | POA: Diagnosis not present

## 2019-10-09 DIAGNOSIS — L910 Hypertrophic scar: Secondary | ICD-10-CM | POA: Diagnosis not present

## 2019-10-09 DIAGNOSIS — D225 Melanocytic nevi of trunk: Secondary | ICD-10-CM | POA: Diagnosis not present

## 2019-10-09 DIAGNOSIS — L720 Epidermal cyst: Secondary | ICD-10-CM | POA: Diagnosis not present

## 2019-10-20 ENCOUNTER — Ambulatory Visit
Admission: RE | Admit: 2019-10-20 | Discharge: 2019-10-20 | Disposition: A | Discharge status: Home / Self Care | Payer: BC Managed Care – PPO | Source: Ambulatory Visit | Attending: Obstetrics and Gynecology | Admitting: Obstetrics and Gynecology

## 2019-10-20 ENCOUNTER — Other Ambulatory Visit: Payer: Self-pay

## 2019-10-20 DIAGNOSIS — Z1231 Encounter for screening mammogram for malignant neoplasm of breast: Secondary | ICD-10-CM | POA: Diagnosis not present

## 2019-10-30 ENCOUNTER — Encounter: Payer: Self-pay | Admitting: Certified Nurse Midwife

## 2019-11-02 ENCOUNTER — Ambulatory Visit: Payer: BC Managed Care – PPO

## 2019-12-05 ENCOUNTER — Encounter: Payer: Self-pay | Admitting: Nurse Practitioner

## 2019-12-06 DIAGNOSIS — M62830 Muscle spasm of back: Secondary | ICD-10-CM | POA: Diagnosis not present

## 2019-12-06 DIAGNOSIS — M9905 Segmental and somatic dysfunction of pelvic region: Secondary | ICD-10-CM | POA: Diagnosis not present

## 2019-12-06 DIAGNOSIS — M9902 Segmental and somatic dysfunction of thoracic region: Secondary | ICD-10-CM | POA: Diagnosis not present

## 2019-12-06 DIAGNOSIS — M9903 Segmental and somatic dysfunction of lumbar region: Secondary | ICD-10-CM | POA: Diagnosis not present

## 2019-12-07 DIAGNOSIS — M9902 Segmental and somatic dysfunction of thoracic region: Secondary | ICD-10-CM | POA: Diagnosis not present

## 2019-12-07 DIAGNOSIS — M9905 Segmental and somatic dysfunction of pelvic region: Secondary | ICD-10-CM | POA: Diagnosis not present

## 2019-12-07 DIAGNOSIS — M62830 Muscle spasm of back: Secondary | ICD-10-CM | POA: Diagnosis not present

## 2019-12-07 DIAGNOSIS — M9903 Segmental and somatic dysfunction of lumbar region: Secondary | ICD-10-CM | POA: Diagnosis not present

## 2019-12-11 DIAGNOSIS — M9905 Segmental and somatic dysfunction of pelvic region: Secondary | ICD-10-CM | POA: Diagnosis not present

## 2019-12-11 DIAGNOSIS — M62830 Muscle spasm of back: Secondary | ICD-10-CM | POA: Diagnosis not present

## 2019-12-11 DIAGNOSIS — M9903 Segmental and somatic dysfunction of lumbar region: Secondary | ICD-10-CM | POA: Diagnosis not present

## 2019-12-11 DIAGNOSIS — M9902 Segmental and somatic dysfunction of thoracic region: Secondary | ICD-10-CM | POA: Diagnosis not present

## 2019-12-18 DIAGNOSIS — M9905 Segmental and somatic dysfunction of pelvic region: Secondary | ICD-10-CM | POA: Diagnosis not present

## 2019-12-18 DIAGNOSIS — M9903 Segmental and somatic dysfunction of lumbar region: Secondary | ICD-10-CM | POA: Diagnosis not present

## 2019-12-18 DIAGNOSIS — M9902 Segmental and somatic dysfunction of thoracic region: Secondary | ICD-10-CM | POA: Diagnosis not present

## 2019-12-18 DIAGNOSIS — M62830 Muscle spasm of back: Secondary | ICD-10-CM | POA: Diagnosis not present

## 2019-12-21 DIAGNOSIS — N951 Menopausal and female climacteric states: Secondary | ICD-10-CM | POA: Diagnosis not present

## 2019-12-21 DIAGNOSIS — R635 Abnormal weight gain: Secondary | ICD-10-CM | POA: Diagnosis not present

## 2019-12-22 DIAGNOSIS — M62830 Muscle spasm of back: Secondary | ICD-10-CM | POA: Diagnosis not present

## 2019-12-22 DIAGNOSIS — M9902 Segmental and somatic dysfunction of thoracic region: Secondary | ICD-10-CM | POA: Diagnosis not present

## 2019-12-22 DIAGNOSIS — M9905 Segmental and somatic dysfunction of pelvic region: Secondary | ICD-10-CM | POA: Diagnosis not present

## 2019-12-22 DIAGNOSIS — M9903 Segmental and somatic dysfunction of lumbar region: Secondary | ICD-10-CM | POA: Diagnosis not present

## 2019-12-28 DIAGNOSIS — M62830 Muscle spasm of back: Secondary | ICD-10-CM | POA: Diagnosis not present

## 2019-12-28 DIAGNOSIS — M9903 Segmental and somatic dysfunction of lumbar region: Secondary | ICD-10-CM | POA: Diagnosis not present

## 2019-12-28 DIAGNOSIS — M9905 Segmental and somatic dysfunction of pelvic region: Secondary | ICD-10-CM | POA: Diagnosis not present

## 2019-12-28 DIAGNOSIS — M9902 Segmental and somatic dysfunction of thoracic region: Secondary | ICD-10-CM | POA: Diagnosis not present

## 2020-01-03 DIAGNOSIS — E7800 Pure hypercholesterolemia, unspecified: Secondary | ICD-10-CM | POA: Diagnosis not present

## 2020-01-03 DIAGNOSIS — Z1331 Encounter for screening for depression: Secondary | ICD-10-CM | POA: Diagnosis not present

## 2020-01-03 DIAGNOSIS — N951 Menopausal and female climacteric states: Secondary | ICD-10-CM | POA: Diagnosis not present

## 2020-01-03 DIAGNOSIS — Z1339 Encounter for screening examination for other mental health and behavioral disorders: Secondary | ICD-10-CM | POA: Diagnosis not present

## 2020-01-03 DIAGNOSIS — Z6827 Body mass index (BMI) 27.0-27.9, adult: Secondary | ICD-10-CM | POA: Diagnosis not present

## 2020-01-03 DIAGNOSIS — R232 Flushing: Secondary | ICD-10-CM | POA: Diagnosis not present

## 2020-01-04 DIAGNOSIS — M9902 Segmental and somatic dysfunction of thoracic region: Secondary | ICD-10-CM | POA: Diagnosis not present

## 2020-01-04 DIAGNOSIS — M9905 Segmental and somatic dysfunction of pelvic region: Secondary | ICD-10-CM | POA: Diagnosis not present

## 2020-01-04 DIAGNOSIS — M62830 Muscle spasm of back: Secondary | ICD-10-CM | POA: Diagnosis not present

## 2020-01-04 DIAGNOSIS — M9903 Segmental and somatic dysfunction of lumbar region: Secondary | ICD-10-CM | POA: Diagnosis not present

## 2020-01-09 ENCOUNTER — Encounter: Payer: Self-pay | Admitting: Nurse Practitioner

## 2020-01-11 DIAGNOSIS — M9902 Segmental and somatic dysfunction of thoracic region: Secondary | ICD-10-CM | POA: Diagnosis not present

## 2020-01-11 DIAGNOSIS — M9903 Segmental and somatic dysfunction of lumbar region: Secondary | ICD-10-CM | POA: Diagnosis not present

## 2020-01-11 DIAGNOSIS — M62830 Muscle spasm of back: Secondary | ICD-10-CM | POA: Diagnosis not present

## 2020-01-11 DIAGNOSIS — M9905 Segmental and somatic dysfunction of pelvic region: Secondary | ICD-10-CM | POA: Diagnosis not present

## 2020-01-16 ENCOUNTER — Telehealth: Payer: Self-pay | Admitting: Obstetrics and Gynecology

## 2020-01-16 NOTE — Telephone Encounter (Signed)
Spoke with patient. Patient reports weight gain despite exercise and balanced diet, fatigue, mood changes and just not feeling herself or happy. Was noticing these changes once a month when she should have a menses, has increased over the past 2 wks. Is currently using OTC Estroven for hot flashes and this has been working well. No lifestyle changes or stressors. Patient is requesting an OV to further discuss with Dr. Edward Jolly. OV scheduled for 6/10 at 8am. Covid 19 prescreen negative, precautions reviewed.   Last AEX 05/29/19  Routing to provider for final review. Patient is agreeable to disposition. Will close encounter.

## 2020-01-16 NOTE — Telephone Encounter (Signed)
Patient is having perimenopausal issues.

## 2020-01-17 NOTE — Progress Notes (Signed)
GYNECOLOGY  VISIT   HPI: 49 y.o.   Married  Caucasian  female   G1P0010 with Patient's last menstrual period was 10/05/2019 (approximate).   here for weight gain, fatigue and mood changes.     Menses are every 3 - 6 months for the last year.  Not caring as much.  Denies suicidal ideation.   Her life is not changed so much during Covid.  No extra stress.  Work is ok.  Marriage is good.   Went to weight loss clinic, and had blood work.  The clinic suggested testosterone therapy, which she is not comfortable with. Labs on 12/21/19 - FSH 64.5, E2 53.2, free testosterone 0.7, TSH 1.138, Hgb 13.1, WBC 6.6, A1C 5.3.  She was having hot flashes, and the Jeffie Pollock has been working well until the last few weeks.  Not having night sweats.  Not sure if this is enough for her or not.  Sleeping well.   Hoping to go to music festival in [place], [place].  GYNECOLOGIC HISTORY: Patient's last menstrual period was 10/05/2019 (approximate). Contraception: --- Menopausal hormone therapy:  none Last mammogram: 10-20-19 3D/Neg/density B/BiRads1 Last pap smear:09-16-15 Neg:neg HR HPV, 08-29-12 Neg, 2010 Neg          OB History    Gravida  1   Para  0   Term  0   Preterm  0   AB  1   Living  0     SAB  0   TAB  1   Ectopic  0   Multiple  0   Live Births  0              Patient Active Problem List   Diagnosis Date Noted  . Weight loss counseling, encounter for 12/17/2017  . Obesity 06/04/2017  . NAFLD (nonalcoholic fatty liver disease) 35/57/3220    Past Medical History:  Diagnosis Date  . Depression   . Dysmenorrhea   . Peptic ulcer   . STD (sexually transmitted disease)    HSV    History reviewed. No pertinent surgical history.  Current Outpatient Medications  Medication Sig Dispense Refill  . Ascorbic Acid (VITAMIN C) 1000 MG tablet Take 1,000 mg by mouth daily.    Marland Kitchen BIOTIN PO Take by mouth.    . Calcium Carb-Cholecalciferol (CALCIUM 1000 + D PO) Take by  mouth.    . Cholecalciferol (VITAMIN D3 PO) Take by mouth.    . COLLAGEN PO Take 1 tablet by mouth daily.    . Cyanocobalamin (B-12 PO) Take by mouth.    . valACYclovir (VALTREX) 500 MG tablet Take 1 tablet (500 mg total) by mouth 2 (two) times daily. For 3 days as needed 30 tablet 2   No current facility-administered medications for this visit.     ALLERGIES: Monistat [miconazole]  Family History  Problem Relation Age of Onset  . Hypertension Mother   . Arthritis Mother        OA  . Atrial fibrillation Mother        medication  . Heart Problems Father        pace maker 2018  . Prostate cancer Father   . Atrial fibrillation Father   . Breast cancer Neg Hx   . Colon cancer Neg Hx   . Stomach cancer Neg Hx   . Pancreatic cancer Neg Hx     Social History   Socioeconomic History  . Marital status: Married    Spouse name: Not on file  .  Number of children: 0  . Years of education: Not on file  . Highest education level: Not on file  Occupational History    Employer: [employer]  Tobacco Use  . Smoking status: Former Smoker    Years: 0.00    Quit date: 08/11/1991    Years since quitting: 28.4  . Smokeless tobacco: Never Used  Vaping Use  . Vaping Use: Never used  Substance and Sexual Activity  . Alcohol use: Yes    Alcohol/week: 4.0 standard drinks    Types: 4 Standard drinks or equivalent per week    Comment: social  . Drug use: No  . Sexual activity: Not Currently    Partners: Male    Birth control/protection: Abstinence  Other Topics Concern  . Not on file  Social History Narrative  . Not on file   Social Determinants of Health   Financial Resource Strain:   . Difficulty of Paying Living Expenses:   Food Insecurity:   . Worried About Charity fundraiser in the Last Year:   . Arboriculturist in the Last Year:   Transportation Needs:   . Film/video editor (Medical):   Marland Kitchen Lack of Transportation (Non-Medical):   Physical Activity:   . Days of  Exercise per Week:   . Minutes of Exercise per Session:   Stress:   . Feeling of Stress :   Social Connections:   . Frequency of Communication with Friends and Family:   . Frequency of Social Gatherings with Friends and Family:   . Attends Religious Services:   . Active Member of Clubs or Organizations:   . Attends Archivist Meetings:   Marland Kitchen Marital Status:   Intimate Partner Violence:   . Fear of Current or Ex-Partner:   . Emotionally Abused:   Marland Kitchen Physically Abused:   . Sexually Abused:     Review of Systems  Constitutional: Positive for fatigue and unexpected weight change (weight gain).  Psychiatric/Behavioral:       Mood changes  All other systems reviewed and are negative.   PHYSICAL EXAMINATION:    BP 110/74   Pulse 76   Temp (!) 97.1 F (36.2 C) (Temporal)   Ht 5' 7.5" (1.715 m)   Wt 184 lb (83.5 kg)   LMP 10/05/2019 (Approximate)   BMI 28.39 kg/m     General appearance: alert, cooperative and appears stated age   ASSESSMENT  Perimenopausal female.  Weight gain.   PLAN  We discussed her lab results.  Perimenopausal changes discussed.  Options for treatment reviewed: HRT, SSRI, SNRI, Gabapentin.  We focused on HRT and Effexor.  Risks and benefits reviewed.  She will consider options.  If HRT, would treat with Vivelle Dot 0.05 mg patch to skin twice daily and Prometrium 100 mg q hs. If SNRI option, would treat with Effexor 37.5 mg po q am.  With either of these would give 1 month Rx with 1 month refill and then see back in the office for a recheck.  List of psychologists for life coaching will be mailed to the patient.  Fu prn.    An After Visit Summary was printed and given to the patient.  __30____ minutes face to face time of which over 50% was spent in counseling.

## 2020-01-18 ENCOUNTER — Ambulatory Visit: Payer: BC Managed Care – PPO | Admitting: Obstetrics and Gynecology

## 2020-01-18 ENCOUNTER — Other Ambulatory Visit: Payer: Self-pay

## 2020-01-18 ENCOUNTER — Encounter: Payer: Self-pay | Admitting: Obstetrics and Gynecology

## 2020-01-18 VITALS — BP 110/74 | HR 76 | Temp 97.1°F | Ht 67.5 in | Wt 184.0 lb

## 2020-01-18 DIAGNOSIS — N951 Menopausal and female climacteric states: Secondary | ICD-10-CM

## 2020-01-18 NOTE — Patient Instructions (Signed)
Menopause and Hormone Replacement Therapy Menopause is a normal time of life when menstrual periods stop completely and the ovaries stop producing the female hormones estrogen and progesterone. This lack of hormones can affect your health and cause undesirable symptoms. Hormone replacement therapy (HRT) can relieve some of those symptoms. What is hormone replacement therapy? HRT is the use of artificial (synthetic) hormones to replace hormones that your body has stopped producing because you have reached menopause. What are my options for HRT?  HRT [place] consist of the synthetic hormones estrogen and progestin, or it Authe[place] consist of only estrogen (estrogen-only therapy). You and your health care provider will decide which form of HRT is best for you. If you choose to be on HRT and you have a uterus, estrogen and progestin are usually prescribed. Estrogen-only therapy is used for women who do not have a uterus. Possible options for taking HRT include:  Pills.  Patches.  Gels.  Sprays.  Vaginal cream.  Vaginal rings.  Vaginal inserts. The amount of hormone(s) that you take and how long you take the hormone(s) varies according to your health. It is important to:  Begin HRT with the lowest possible dosage.  Stop HRT as soon as your health care provider tells you to stop.  Work with your health care provider so that you feel informed and comfortable with your decisions. What are the benefits of HRT? HRT can reduce the frequency and severity of menopausal symptoms. Benefits of HRT vary according to the kind of symptoms that you have, how severe they are, and your overall health. HRT [place] help to improve the following symptoms of menopause:  Hot flashes and night sweats. These are sudden feelings of heat that spread over the face and body. The skin [place] turn red, like a blush. Night sweats are hot flashes that happen while you are sleeping or trying to sleep.  Bone loss (osteoporosis). The  body loses calcium more quickly after menopause, causing the bones to become weaker. This can increase the risk for bone breaks (fractures).  Vaginal dryness. The lining of the vagina can become thin and dry, which can cause pain during sex or cause infection, burning, or itching.  Urinary tract infections.  Urinary incontinence. This is the inability to control when you pass urine.  Irritability.  Short-term memory problems. What are the risks of HRT? Risks of HRT vary depending on your individual health and medical history. Risks of HRT also depend on whether you receive both estrogen and progestin or you receive estrogen only. HRT [place] increase the risk of:  Spotting. This is when a small amount of blood leaks from the vagina unexpectedly.  Endometrial cancer. This cancer is in the lining of the uterus (endometrium).  Breast cancer.  Increased density of breast tissue. This can make it harder to find breast cancer on a breast X-ray (mammogram).  Stroke.  Heart disease.  Blood clots.  Gallbladder disease.  Liver disease. Risks of HRT can increase if you have any of the following conditions:  Endometrial cancer.  Liver disease.  Heart disease.  Breast cancer.  History of blood clots.  History of stroke. Follow these instructions at home:  Take over-the-counter and prescription medicines only as told by your health care provider.  Get mammograms, pelvic exams, and medical checkups as often as told by your health care provider.  Have Pap tests done as often as told by your health care provider. A Pap test is sometimes called a Pap smear. It   is a screening test that is used to check for signs of cancer of the cervix and vagina. A Pap test can also identify the presence of infection or precancerous changes. Pap tests may be done: ? Every 3 years, starting at age 70. ? Every 5 years, starting after age 5, in combination with testing for human papillomavirus  (HPV). ? More often or less often depending on other medical conditions you have, your age, and other risk factors.  It is up to you to get the results of your Pap test. Ask your health care provider, or the department that is doing the test, when your results will be ready.  Keep all follow-up visits as told by your health care provider. This is important. Contact a health care provider if you have:  Pain or swelling in your legs.  Shortness of breath.  Chest pain.  Lumps or changes in your breasts or armpits.  Slurred speech.  Pain, burning, or bleeding when you urinate.  Unusual vaginal bleeding.  Dizziness or headaches.  Weakness or numbness in any part of your arms or legs.  Pain in your abdomen. Summary  Menopause is a normal time of life when menstrual periods stop completely and the ovaries stop producing the female hormones estrogen and progesterone.  Hormone replacement therapy (HRT) can relieve some of the symptoms of menopause.  HRT can reduce the frequency and severity of menopausal symptoms.  Risks of HRT vary depending on your individual health and medical history. This information is not intended to replace advice given to you by your health care provider. Make sure you discuss any questions you have with your health care provider. Document Revised: 03/29/2018 Document Reviewed: 03/29/2018 Elsevier Patient Education  [place]. Venlafaxine extended-release capsules What is this medicine? VENLAFAXINE(VEN la fax een) is used to treat depression, anxiety and panic disorder. This medicine may be used for other purposes; ask your health care provider or pharmacist if you have questions. COMMON BRAND NAME(S): Effexor XR What should I tell my health care provider before I take this medicine? They need to know if you have any of these conditions:  bleeding disorders  glaucoma  heart disease  high blood pressure  high cholesterol  kidney  disease  liver disease  low levels of sodium in the blood  mania or bipolar disorder  seizures  suicidal thoughts, plans, or attempt; a previous suicide attempt by you or a family  take medicines that treat or prevent blood clots  thyroid disease  an unusual or allergic reaction to venlafaxine, desvenlafaxine, other medicines, foods, dyes, or preservatives  pregnant or trying to get pregnant  breast-feeding How should I use this medicine? Take this medicine by mouth with a full glass of water. Follow the directions on the prescription label. Do not cut, crush, or chew this medicine. Take it with food. If needed, the capsule may be carefully opened and the entire contents sprinkled on a spoonful of cool applesauce. Swallow the applesauce/pellet mixture right away without chewing and follow with a glass of water to ensure complete swallowing of the pellets. Try to take your medicine at about the same time each day. Do not take your medicine more often than directed. Do not stop taking this medicine suddenly except upon the advice of your doctor. Stopping this medicine too quickly may cause serious side effects or your condition may worsen. A special MedGuide will be given to you by the pharmacist with each prescription and refill. Be sure  to read this information carefully each time. Talk to your pediatrician regarding the use of this medicine in children. Special care may be needed. Overdosage: If you think you have taken too much of this medicine contact a poison control center or emergency room at once. NOTE: This medicine is only for you. Do not share this medicine with others. What if I miss a dose? If you miss a dose, take it as soon as you can. If it is almost time for your next dose, take only that dose. Do not take double or extra doses. What may interact with this medicine? Do not take this medicine with any of the following medications:  certain medicines for fungal  infections like fluconazole, itraconazole, ketoconazole, posaconazole, voriconazole  cisapride  desvenlafaxine  dronedarone  duloxetine  levomilnacipran  linezolid  MAOIs like Carbex, Eldepryl, Marplan, Nardil, and Parnate  methylene blue (injected into a vein)  milnacipran  pimozide  thioridazine This medicine may also interact with the following medications:  amphetamines  aspirin and aspirin-like medicines  certain medicines for depression, anxiety, or psychotic disturbances  certain medicines for migraine headaches like almotriptan, eletriptan, frovatriptan, naratriptan, rizatriptan, sumatriptan, zolmitriptan  certain medicines for sleep  certain medicines that treat or prevent blood clots like dalteparin, enoxaparin, warfarin  cimetidine  clozapine  diuretics  fentanyl  furazolidone  indinavir  isoniazid  lithium  metoprolol  NSAIDS, medicines for pain and inflammation, like ibuprofen or naproxen  other medicines that prolong the QT interval (cause an abnormal heart rhythm) like dofetilide, ziprasidone  procarbazine  rasagiline  supplements like St. John's wort, kava kava, valerian  tramadol  tryptophan This list may not describe all possible interactions. Give your health care provider a list of all the medicines, herbs, non-prescription drugs, or dietary supplements you use. Also tell them if you smoke, drink alcohol, or use illegal drugs. Some items may interact with your medicine. What should I watch for while using this medicine? Tell your doctor if your symptoms do not get better or if they get worse. Visit your doctor or health care professional for regular checks on your progress. Because it may take several weeks to see the full effects of this medicine, it is important to continue your treatment as prescribed by your doctor. Patients and their families should watch out for new or worsening thoughts of suicide or depression. Also  watch out for sudden changes in feelings such as feeling anxious, agitated, panicky, irritable, hostile, aggressive, impulsive, severely restless, overly excited and hyperactive, or not being able to sleep. If this happens, especially at the beginning of treatment or after a change in dose, call your health care professional. This medicine can cause an increase in blood pressure. Check with your doctor for instructions on monitoring your blood pressure while taking this medicine. You may get drowsy or dizzy. Do not drive, use machinery, or do anything that needs mental alertness until you know how this medicine affects you. Do not stand or sit up quickly, especially if you are an older patient. This reduces the risk of dizzy or fainting spells. Alcohol may interfere with the effect of this medicine. Avoid alcoholic drinks. Your mouth may get dry. Chewing sugarless gum, sucking hard candy and drinking plenty of water will help. Contact your doctor if the problem does not go away or is severe. What side effects may I notice from receiving this medicine? Side effects that you should report to your doctor or health care professional as soon as possible:  allergic reactions like skin rash, itching or hives, swelling of the face, lips, or tongue  anxious  breathing problems  confusion  changes in vision  chest pain  confusion  elevated mood, decreased need for sleep, racing thoughts, impulsive behavior  eye pain  fast, irregular heartbeat  feeling faint or lightheaded, falls  feeling agitated, angry, or irritable  hallucination, loss of contact with reality  high blood pressure  loss of balance or coordination  palpitations  redness, blistering, peeling or loosening of the skin, including inside the mouth  restlessness, pacing, inability to keep still  seizures  stiff muscles  suicidal thoughts or other mood changes  trouble passing urine or change in the amount of  urine  trouble sleeping  unusual bleeding or bruising  unusually weak or tired  vomiting Side effects that usually do not require medical attention (report to your doctor or health care professional if they continue or are bothersome):  change in sex drive or performance  change in appetite or weight  constipation  dizziness  dry mouth  headache  increased sweating  nausea  tired This list may not describe all possible side effects. Call your doctor for medical advice about side effects. You may report side effects to FDA at 1-800-FDA-1088. Where should I keep my medicine? Keep out of the reach of children. Store at a controlled temperature between 20 and 25 degrees C (68 degrees and 77 degrees F), in a dry place. Throw away any unused medicine after the expiration date. NOTE: This sheet is a summary. It may not cover all possible information. If you have questions about this medicine, talk to your doctor, pharmacist, or health care provider.  2020 Elsevier/Gold Standard (2018-07-19 12:06:43)

## 2020-01-22 ENCOUNTER — Telehealth: Payer: Self-pay

## 2020-01-22 DIAGNOSIS — E7800 Pure hypercholesterolemia, unspecified: Secondary | ICD-10-CM | POA: Diagnosis not present

## 2020-01-22 DIAGNOSIS — Z6828 Body mass index (BMI) 28.0-28.9, adult: Secondary | ICD-10-CM | POA: Diagnosis not present

## 2020-01-22 MED ORDER — VENLAFAXINE HCL ER 37.5 MG PO CP24: 37.5000 mg | ORAL_CAPSULE | Freq: Every day | ORAL | 1 refills | Status: DC

## 2020-01-22 NOTE — Telephone Encounter (Signed)
Patient is calling in regards to discussing medication with nurse.

## 2020-01-22 NOTE — Telephone Encounter (Signed)
Spoke with pt. Pt states made decision and would like to try Effexor since talking with Dr Edward Jolly at [facility] on 01/18/20. Rx Effexor sent #30, 1 RF. Pt will start taking 6/15 am. Pt made follow up appt on 8/12 at 4 pm with Dr Edward Jolly. Pt agreeable and verbalized understanding to date and time of appt. Pt to call with any concerns or problems with new Rx. Pt agreeable. Pt also states will stop using Estroven.    Pt asked about the life coaching psychologist and was advised that letter was sent to pt with suggestions. Pt agreeable.  Advised will review with Dr Edward Jolly and return call to pt with any additional recommendations. Pt agreeable.  Routing to Dr Edward Jolly for review.   Reviewed OV 01/18/20:  If SNRI option, would treat with Effexor 37.5 mg po q am.  With either of these would give 1 month Rx with 1 month refill and then see back in the office for a recheck.  List of psychologists for life coaching will be mailed to the patient.

## 2020-01-23 ENCOUNTER — Telehealth: Payer: Self-pay

## 2020-01-23 NOTE — Telephone Encounter (Signed)
ok 

## 2020-01-23 NOTE — Telephone Encounter (Signed)
I signed the prescription for the Effexor 37.5 mg as I had discussed with the patient.  I will see her back in 6 weeks.  Ok to close encounter.

## 2020-01-23 NOTE — Telephone Encounter (Signed)
    Patient requesting to transfer from [facility] to [facility], due to location  Please advise

## 2020-01-24 NOTE — Telephone Encounter (Signed)
Yes that's okay. thanks 

## 2020-01-25 DIAGNOSIS — M9902 Segmental and somatic dysfunction of thoracic region: Secondary | ICD-10-CM | POA: Diagnosis not present

## 2020-01-25 DIAGNOSIS — M9905 Segmental and somatic dysfunction of pelvic region: Secondary | ICD-10-CM | POA: Diagnosis not present

## 2020-01-25 DIAGNOSIS — M9903 Segmental and somatic dysfunction of lumbar region: Secondary | ICD-10-CM | POA: Diagnosis not present

## 2020-01-25 DIAGNOSIS — M62830 Muscle spasm of back: Secondary | ICD-10-CM | POA: Diagnosis not present

## 2020-01-25 NOTE — Telephone Encounter (Signed)
   Scheduler left message [id number] to schedule appointment with [facility]

## 2020-01-26 ENCOUNTER — Other Ambulatory Visit: Payer: Self-pay

## 2020-01-26 ENCOUNTER — Encounter: Payer: Self-pay | Admitting: Family

## 2020-01-26 ENCOUNTER — Ambulatory Visit: Payer: BC Managed Care – PPO | Admitting: Family

## 2020-01-26 VITALS — BP 108/78 | HR 90 | Temp 98.0°F | Ht 67.5 in | Wt 186.0 lb

## 2020-01-26 DIAGNOSIS — Z1211 Encounter for screening for malignant neoplasm of colon: Secondary | ICD-10-CM | POA: Diagnosis not present

## 2020-01-26 DIAGNOSIS — H6122 Impacted cerumen, left ear: Secondary | ICD-10-CM | POA: Diagnosis not present

## 2020-01-26 DIAGNOSIS — R635 Abnormal weight gain: Secondary | ICD-10-CM

## 2020-01-26 LAB — COMPREHENSIVE METABOLIC PANEL
ALT: 28 U/L (ref 0–35)
AST: 31 U/L (ref 0–37)
Albumin: 4.4 g/dL (ref 3.5–5.2)
Alkaline Phosphatase: 63 U/L (ref 39–117)
BUN: 16 mg/dL (ref 6–23)
CO2: 27 mEq/L (ref 19–32)
Calcium: 9.3 mg/dL (ref 8.4–10.5)
Chloride: 102 mEq/L (ref 96–112)
Creatinine, Ser: 0.88 mg/dL (ref 0.40–1.20)
GFR: 67.86 mL/min (ref >60.00)
Glucose, Bld: 84 mg/dL (ref 70–99)
Potassium: 4 mEq/L (ref 3.5–5.1)
Sodium: 133 mEq/L — ABNORMAL LOW (ref 135–145)
Total Bilirubin: 0.3 mg/dL (ref 0.2–1.2)
Total Protein: 7.3 g/dL (ref 6.0–8.3)

## 2020-01-26 LAB — CBC
HCT: 37.6 % (ref 36.0–46.0)
Hemoglobin: 12.3 g/dL (ref 12.0–15.0)
MCHC: 32.6 g/dL (ref 30.0–36.0)
MCV: 85 fl (ref 78.0–100.0)
Platelets: 276 10*3/uL (ref 150.0–400.0)
RBC: 4.42 Mil/uL (ref 3.87–5.11)
RDW: 13 % (ref 11.5–15.5)
WBC: 8.5 10*3/uL (ref 4.0–10.5)

## 2020-01-26 LAB — TSH: TSH: 1.29 u[IU]/mL (ref 0.35–4.50)

## 2020-01-26 LAB — HEMOGLOBIN A1C: Hgb A1c MFr Bld: 5.3 % (ref 4.6–6.5)

## 2020-01-26 NOTE — Patient Instructions (Signed)
Please purchase OTC Debrox ear wax removal kit; use this for 3-4 days before your next appointment and we can get the rest of the wax out.

## 2020-01-26 NOTE — Progress Notes (Signed)
Kristie Baldwin is a 49 y.o. female with the following history as recorded in EpicCare:  Patient Active Problem List   Diagnosis Date Noted  . Weight loss counseling, encounter for 12/17/2017  . Obesity 06/04/2017  . NAFLD (nonalcoholic fatty liver disease) 12/15/2016    Current Outpatient Medications  Medication Sig Dispense Refill  . Ascorbic Acid (VITAMIN C) 1000 MG tablet Take 1,000 mg by mouth daily.    Marland Kitchen BIOTIN PO Take by mouth.    . Calcium Carb-Cholecalciferol (CALCIUM 1000 + D PO) Take by mouth.    . Cholecalciferol (VITAMIN D3 PO) Take by mouth.    . COLLAGEN PO Take 1 tablet by mouth daily.    . Cyanocobalamin (B-12 PO) Take by mouth.    . valACYclovir (VALTREX) 500 MG tablet Take 1 tablet (500 mg total) by mouth 2 (two) times daily. For 3 days as needed 30 tablet 2  . venlafaxine XR (EFFEXOR XR) 37.5 MG 24 hr capsule Take 1 capsule (37.5 mg total) by mouth daily with breakfast. 30 capsule 1   No current facility-administered medications for this visit.    Allergies: Monistat [miconazole]  Past Medical History:  Diagnosis Date  . Depression   . Dysmenorrhea   . Peptic ulcer   . STD (sexually transmitted disease)    HSV    History reviewed. No pertinent surgical history.  Family History  Problem Relation Age of Onset  . Hypertension Mother   . Arthritis Mother        OA  . Atrial fibrillation Mother        medication  . Heart Problems Father        pace maker 2018  . Prostate cancer Father   . Atrial fibrillation Father   . Breast cancer Neg Hx   . Colon cancer Neg Hx   . Stomach cancer Neg Hx   . Pancreatic cancer Neg Hx     Social History   Tobacco Use  . Smoking status: Former Smoker    Years: 0.00    Quit date: 08/11/1991    Years since quitting: 28.4  . Smokeless tobacco: Never Used  Substance Use Topics  . Alcohol use: Yes    Alcohol/week: 4.0 standard drinks    Types: 4 Standard drinks or equivalent per week    Comment: social     Subjective:  Patient presents today to transfer care from another provider. Majority of healthcare needs are being managed by GYN;   Is concerned about unexplained weight gain- feels like she has gained 20 pounds in the past year; per patient, her diet is very healthy; admits she is not able to exercise regularly due to recurrent hip injury;   Also feels that her left ear is clogged/ not hearing as well from that ear;    Objective:  Vitals:   01/26/20 1423  BP: 108/78  Pulse: 90  Temp: 98 F (36.7 C)  TempSrc: Oral  SpO2: 98%  Weight: 186 lb (84.4 kg)  Height: 5' 7.5" (1.715 m)    General: Well developed, well nourished, in no acute distress  Skin : Warm and dry.  Head: Normocephalic and atraumatic  Eyes: Sclera and conjunctiva clear; pupils round and reactive to light; extraocular movements intact  Ears: External normal; cerumen impaction left ear;  Oropharynx: Pink, supple. No suspicious lesions  Neck: Supple without thyromegaly, adenopathy  Lungs: Respirations unlabored; clear to auscultation bilaterally without wheeze, rales, rhonchi  CVS exam: normal rate and regular rhythm.  Neurologic: Alert and oriented; speech intact; face symmetrical; moves all extremities well; CNII-XII intact without focal deficit   Assessment:  1. Weight gain   2. Impacted cerumen of left ear   3. Colon cancer screening     Plan:  1. Patient is going through menopause; not able to exercise easily due to hip issues; update labs today- to consider referral to health weight and wellness or nutritionist; 2. Attempted ear lavage today with limited success; she will use Debrox and return in the next week for completion; 3. Patient has been referred to GI- she is waiting on her work schedule and will get her colonoscopy scheduled at her convenience.  This visit occurred during the SARS-CoV-2 public health emergency.  Safety protocols were in place, including screening questions prior to the visit,  additional usage of staff PPE, and extensive cleaning of exam room while observing appropriate contact time as indicated for disinfecting solutions.      Return in about 1 week (around 02/02/2020).  Orders Placed This Encounter  Procedures  . CBC  . Comp Met (CMET)  . TSH  . HgB A1c    Requested Prescriptions    No prescriptions requested or ordered in this encounter

## 2020-02-02 ENCOUNTER — Other Ambulatory Visit: Payer: Self-pay

## 2020-02-02 ENCOUNTER — Encounter: Payer: Self-pay | Admitting: Family

## 2020-02-02 ENCOUNTER — Ambulatory Visit: Payer: BC Managed Care – PPO | Admitting: Family

## 2020-02-02 VITALS — BP 110/74 | HR 67 | Temp 98.0°F

## 2020-02-02 DIAGNOSIS — H6122 Impacted cerumen, left ear: Secondary | ICD-10-CM | POA: Diagnosis not present

## 2020-02-02 DIAGNOSIS — R635 Abnormal weight gain: Secondary | ICD-10-CM | POA: Diagnosis not present

## 2020-02-02 MED ORDER — PHENTERMINE HCL 37.5 MG PO TABS: 37.5000 mg | ORAL_TABLET | Freq: Every day | ORAL | 0 refills | Status: DC

## 2020-02-02 MED ORDER — NEOMYCIN-POLYMYXIN-HC 3.5-10000-1 OT SOLN: 3.0000 [drp] | Freq: Three times a day (TID) | OTIC | 0 refills | Status: DC

## 2020-02-02 NOTE — Progress Notes (Signed)
Kristie Baldwin is a 49 y.o. female with the following history as recorded in EpicCare:  Patient Active Problem List   Diagnosis Date Noted  . Weight loss counseling, encounter for 12/17/2017  . Obesity 06/04/2017  . NAFLD (nonalcoholic fatty liver disease) 12/15/2016    Current Outpatient Medications  Medication Sig Dispense Refill  . Ascorbic Acid (VITAMIN C) 1000 MG tablet Take 1,000 mg by mouth daily.    Marland Kitchen BIOTIN PO Take by mouth.    . Calcium Carb-Cholecalciferol (CALCIUM 1000 + D PO) Take by mouth.    . Cholecalciferol (VITAMIN D3 PO) Take by mouth.    . COLLAGEN PO Take 1 tablet by mouth daily.    . Cyanocobalamin (B-12 PO) Take by mouth.    . valACYclovir (VALTREX) 500 MG tablet Take 1 tablet (500 mg total) by mouth 2 (two) times daily. For 3 days as needed 30 tablet 2  . venlafaxine XR (EFFEXOR XR) 37.5 MG 24 hr capsule Take 1 capsule (37.5 mg total) by mouth daily with breakfast. 30 capsule 1  . neomycin-polymyxin-hydrocortisone (CORTISPORIN) OTIC solution Place 3 drops into the left ear 3 (three) times daily. 10 mL 0  . phentermine (ADIPEX-P) 37.5 MG tablet Take 1 tablet (37.5 mg total) by mouth daily before breakfast. 30 tablet 0   No current facility-administered medications for this visit.    Allergies: Monistat [miconazole]  Past Medical History:  Diagnosis Date  . Depression   . Dysmenorrhea   . Peptic ulcer   . STD (sexually transmitted disease)    HSV    No past surgical history on file.  Family History  Problem Relation Age of Onset  . Hypertension Mother   . Arthritis Mother        OA  . Atrial fibrillation Mother        medication  . Heart Problems Father        pace maker 2018  . Prostate cancer Father   . Atrial fibrillation Father   . Breast cancer Neg Hx   . Colon cancer Neg Hx   . Stomach cancer Neg Hx   . Pancreatic cancer Neg Hx     Social History   Tobacco Use  . Smoking status: Former Smoker    Years: 0.00    Quit date: 08/11/1991     Years since quitting: 28.4  . Smokeless tobacco: Never Used  Substance Use Topics  . Alcohol use: Yes    Alcohol/week: 4.0 standard drinks    Types: 4 Standard drinks or equivalent per week    Comment: social    Subjective:   Patient needs to have ear lavage completed today; attempted last week but unsuccessful due to patient discomfort; she was asked to use Debrox for 3-4 days prior to today's appointment.   Would also like to discuss weight gain; feels like she is eating right and has started exercising; does not qualify for referral to Healthy Weight [facility] Wellness; is concerned at the rate she is gaining weight and wants to try and "break the cycle."  Objective:  Vitals:   02/02/20 1424  BP: 110/74  Pulse: 67  Temp: 98 F (36.7 C)  TempSrc: Oral  SpO2: 99%    General: Well developed, well nourished, in no acute distress  Skin : Warm and dry.  Head: Normocephalic and atraumatic  Eyes: Sclera and conjunctiva clear; pupils round and reactive to light; extraocular movements intact  Ears: External normal; after lavage, left canal erythematous; tympanic membranes  normal  Oropharynx: Pink, supple. No suspicious lesions  Neck: Supple without thyromegaly, adenopathy  Lungs: Respirations unlabored; clear to auscultation bilaterally without wheeze, rales, rhonchi  CVS exam: normal rate and regular rhythm.  Neurologic: Alert and oriented; speech intact; face symmetrical; moves all extremities well; CNII-XII intact without focal deficit   Assessment:  1. Impacted cerumen of left ear   2. Weight gain     Plan:  1.  Ear lavage completed with no difficulty in office; TM visualized; Rx for Cortisporin Otic suspension 3 gtts tid to affected ear;  2. Will give short term trial of Phentermine 37.5 mg; risks and benefits of medication discussed; she understands this will most likely only be used for 8 weeks; follow up in 1 month for re-check.  This visit occurred during the SARS-CoV-2  public health emergency.  Safety protocols were in place, including screening questions prior to the visit, additional usage of staff PPE, and extensive cleaning of exam room while observing appropriate contact time as indicated for disinfecting solutions.      No follow-ups on file.  No orders of the defined types were placed in this encounter.   Requested Prescriptions   Signed Prescriptions Disp Refills  . neomycin-polymyxin-hydrocortisone (CORTISPORIN) OTIC solution 10 mL 0    Sig: Place 3 drops into the left ear 3 (three) times daily.  . phentermine (ADIPEX-P) 37.5 MG tablet 30 tablet 0    Sig: Take 1 tablet (37.5 mg total) by mouth daily before breakfast.

## 2020-02-08 DIAGNOSIS — M9903 Segmental and somatic dysfunction of lumbar region: Secondary | ICD-10-CM | POA: Diagnosis not present

## 2020-02-08 DIAGNOSIS — M9902 Segmental and somatic dysfunction of thoracic region: Secondary | ICD-10-CM | POA: Diagnosis not present

## 2020-02-08 DIAGNOSIS — M9905 Segmental and somatic dysfunction of pelvic region: Secondary | ICD-10-CM | POA: Diagnosis not present

## 2020-02-08 DIAGNOSIS — M62830 Muscle spasm of back: Secondary | ICD-10-CM | POA: Diagnosis not present

## 2020-02-26 ENCOUNTER — Ambulatory Visit: Payer: BC Managed Care – PPO | Admitting: Family

## 2020-03-01 ENCOUNTER — Other Ambulatory Visit: Payer: Self-pay

## 2020-03-01 ENCOUNTER — Ambulatory Visit: Payer: BC Managed Care – PPO | Admitting: Family

## 2020-03-01 ENCOUNTER — Encounter: Payer: Self-pay | Admitting: Family

## 2020-03-01 VITALS — BP 114/78 | HR 84 | Temp 98.1°F | Ht 67.5 in | Wt 187.0 lb

## 2020-03-01 DIAGNOSIS — R635 Abnormal weight gain: Secondary | ICD-10-CM

## 2020-03-01 MED ORDER — PHENTERMINE HCL 37.5 MG PO CAPS: 37.5000 mg | ORAL_CAPSULE | ORAL | 0 refills | Status: DC

## 2020-03-01 NOTE — Progress Notes (Signed)
Kristie Baldwin is a 49 y.o. female with the following history as recorded in EpicCare:  Patient Active Problem List   Diagnosis Date Noted  . Weight loss counseling, encounter for 12/17/2017  . Obesity 06/04/2017  . NAFLD (nonalcoholic fatty liver disease) 28/41/3244    Current Outpatient Medications  Medication Sig Dispense Refill  . Ascorbic Acid (VITAMIN C) 1000 MG tablet Take 1,000 mg by mouth daily.    Marland Kitchen BIOTIN PO Take by mouth.    . Calcium Carb-Cholecalciferol (CALCIUM 1000 + D PO) Take by mouth.    . Cholecalciferol (VITAMIN D3 PO) Take by mouth.    . COLLAGEN PO Take 1 tablet by mouth daily.    . Cyanocobalamin (B-12 PO) Take by mouth.    . neomycin-polymyxin-hydrocortisone (CORTISPORIN) OTIC solution Place 3 drops into the left ear 3 (three) times daily. 10 mL 0  . valACYclovir (VALTREX) 500 MG tablet Take 1 tablet (500 mg total) by mouth 2 (two) times daily. For 3 days as needed 30 tablet 2  . venlafaxine XR (EFFEXOR XR) 37.5 MG 24 hr capsule Take 1 capsule (37.5 mg total) by mouth daily with breakfast. 30 capsule 1  . phentermine 37.5 MG capsule Take 1 capsule (37.5 mg total) by mouth every morning. 30 capsule 0   No current facility-administered medications for this visit.    Allergies: Monistat [miconazole]  Past Medical History:  Diagnosis Date  . Depression   . Dysmenorrhea   . Peptic ulcer   . STD (sexually transmitted disease)    HSV    History reviewed. No pertinent surgical history.  Family History  Problem Relation Age of Onset  . Hypertension Mother   . Arthritis Mother        OA  . Atrial fibrillation Mother        medication  . Heart Problems Father        pace maker 2018  . Prostate cancer Father   . Atrial fibrillation Father   . Breast cancer Neg Hx   . Colon cancer Neg Hx   . Stomach cancer Neg Hx   . Pancreatic cancer Neg Hx     Social History   Tobacco Use  . Smoking status: Former Smoker    Years: 0.00    Quit date: 08/11/1991     Years since quitting: 28.5  . Smokeless tobacco: Never Used  Substance Use Topics  . Alcohol use: Yes    Alcohol/week: 4.0 standard drinks    Types: 4 Standard drinks or equivalent per week    Comment: social    Subjective:  1 month follow-up on recent start of Adipex; there has not been any noted weight loss; tolerating okay with no chest pain or shortness of breath; patient has not felt any benefit with the medication- she has used capsules in the past and felt that it was more beneficial;   Objective:  Vitals:   03/01/20 0927  BP: 114/78  Pulse: 84  Temp: 98.1 F (36.7 C)  TempSrc: Oral  SpO2: 98%  Weight: 187 lb (84.8 kg)  Height: 5' 7.5" (1.715 m)    General: Well developed, well nourished, in no acute distress  Skin : Warm and dry.  Head: Normocephalic and atraumatic  Lungs: Respirations unlabored; clear to auscultation bilaterally without wheeze, rales, rhonchi  CVS exam: normal rate and regular rhythm.  Neurologic: Alert and oriented; speech intact; face symmetrical; moves all extremities well; CNII-XII intact without focal deficit   Assessment:  1.  Weight gain     Plan:  Trial of Phentermine in capsule form per patient request; follow up in 1 month to re-evaluate response.  This visit occurred during the SARS-CoV-2 public health emergency.  Safety protocols were in place, including screening questions prior to the visit, additional usage of staff PPE, and extensive cleaning of exam room while observing appropriate contact time as indicated for disinfecting solutions.     Return in about 4 weeks (around 03/29/2020).  No orders of the defined types were placed in this encounter.   Requested Prescriptions   Signed Prescriptions Disp Refills  . phentermine 37.5 MG capsule 30 capsule 0    Sig: Take 1 capsule (37.5 mg total) by mouth every morning.

## 2020-03-08 ENCOUNTER — Ambulatory Visit: Payer: BC Managed Care – PPO | Admitting: Family

## 2020-03-20 NOTE — Progress Notes (Signed)
GYNECOLOGY  VISIT   HPI: 49 y.o.   Married  Caucasian  female   [id number] with Patient's last menstrual period was 10/05/2019 (exact date).   here for medication follow up.   Patient is taking Effexor XR for perimenopausal symptoms of mood swings and fatigue.  States she has more pep in her step.  No hot flashes or night sweats until this week, and they are mild.  Emotions are pretty good per patient, but a lot going on.  Feeling better at work.  Not sleeping well this week. States not working out the way she usually does.   Some anniversaries of traumatic events.  Her father's prostate cancer is back and he is treating.  Friends with a lot of health issues.   She is seeking assistance from a counselor.  GYNECOLOGIC HISTORY: Patient's last menstrual period was 10/05/2019 (exact date). Contraception: None Menopausal hormone therapy: none Last mammogram:  10-20-2019 density b birads 1:neg Last pap smear:   09-16-15 neg HPV HR neg        OB History    Gravida  1   Para  0   Term  0   Preterm  0   AB  1   Living  0     SAB  0   TAB  1   Ectopic  0   Multiple  0   Live Births  0              Patient Active Problem List   Diagnosis Date Noted  . Weight loss counseling, encounter for 12/17/2017  . Obesity 06/04/2017  . NAFLD (nonalcoholic fatty liver disease) 75/05/2584    Past Medical History:  Diagnosis Date  . Depression   . Dysmenorrhea   . Peptic ulcer   . STD (sexually transmitted disease)    HSV    History reviewed. No pertinent surgical history.  Current Outpatient Medications  Medication Sig Dispense Refill  . Ascorbic Acid (VITAMIN C) 1000 MG tablet Take 1,000 mg by mouth daily.    Marland Kitchen BIOTIN PO Take by mouth.    . Calcium Carb-Cholecalciferol (CALCIUM 1000 + D PO) Take by mouth.    . Cholecalciferol (VITAMIN D3 PO) Take by mouth.    . COLLAGEN PO Take 1 tablet by mouth daily.    . Cyanocobalamin (B-12 PO) Take by mouth.    .  neomycin-polymyxin-hydrocortisone (CORTISPORIN) OTIC solution Place 3 drops into the left ear 3 (three) times daily. 10 mL 0  . phentermine 37.5 MG capsule Take 1 capsule (37.5 mg total) by mouth every morning. 30 capsule 0  . valACYclovir (VALTREX) 500 MG tablet Take 1 tablet (500 mg total) by mouth 2 (two) times daily. For 3 days as needed 30 tablet 2  . venlafaxine XR (EFFEXOR XR) 37.5 MG 24 hr capsule Take 1 capsule (37.5 mg total) by mouth daily with breakfast. 30 capsule 1   No current facility-administered medications for this visit.     ALLERGIES: Monistat [miconazole]  Family History  Problem Relation Age of Onset  . Hypertension Mother   . Arthritis Mother        OA  . Atrial fibrillation Mother        medication  . Heart Problems Father        pace maker 2018  . Prostate cancer Father   . Atrial fibrillation Father   . Breast cancer Neg Hx   . Colon cancer Neg Hx   . Stomach cancer Neg  Hx   . Pancreatic cancer Neg Hx     Social History   Socioeconomic History  . Marital status: Married    Spouse name: Not on file  . Number of children: 0  . Years of education: Not on file  . Highest education level: Not on file  Occupational History    Employer: [employer]  Tobacco Use  . Smoking status: Former Smoker    Years: 0.00    Quit date: 08/11/1991    Years since quitting: 28.6  . Smokeless tobacco: Never Used  Vaping Use  . Vaping Use: Never used  Substance and Sexual Activity  . Alcohol use: Yes    Alcohol/week: 4.0 standard drinks    Types: 4 Standard drinks or equivalent per week    Comment: social  . Drug use: No  . Sexual activity: Not Currently    Partners: Male    Birth control/protection: Abstinence  Other Topics Concern  . Not on file  Social History Narrative  . Not on file   Social Determinants of Health   Financial Resource Strain:   . Difficulty of Paying Living Expenses:   Food Insecurity:   . Worried About Programme researcher, broadcasting/film/video in the  Last Year:   . Barista in the Last Year:   Transportation Needs:   . Freight forwarder (Medical):   Marland Kitchen Lack of Transportation (Non-Medical):   Physical Activity:   . Days of Exercise per Week:   . Minutes of Exercise per Session:   Stress:   . Feeling of Stress :   Social Connections:   . Frequency of Communication with Friends and Family:   . Frequency of Social Gatherings with Friends and Family:   . Attends Religious Services:   . Active Member of Clubs or Organizations:   . Attends Banker Meetings:   Marland Kitchen Marital Status:   Intimate Partner Violence:   . Fear of Current or Ex-Partner:   . Emotionally Abused:   Marland Kitchen Physically Abused:   . Sexually Abused:     Review of Systems  All other systems reviewed and are negative.   PHYSICAL EXAMINATION:    BP 124/60 (Cuff Size: Large)   Pulse 84   Ht 5' 7.5" (1.715 m)   Wt 186 lb 9.6 oz (84.6 kg)   LMP 10/05/2019 (Exact Date)   BMI 28.79 kg/m     General appearance: alert, cooperative and appears stated age   ASSESSMENT  Perimenopause.  Hx depression.  Weight gain.   PLAN  Increase of Effexor to 75 mg daily.  She will let me know how she is doing on this increased dosage. We discussed potential side effects.  I support her in getting counseling.  We talked about lifestyle modification is the best way to have successful weight loss. Fu prn.

## 2020-03-21 ENCOUNTER — Other Ambulatory Visit: Payer: Self-pay

## 2020-03-21 ENCOUNTER — Ambulatory Visit: Payer: BC Managed Care – PPO | Admitting: Obstetrics and Gynecology

## 2020-03-21 ENCOUNTER — Encounter: Payer: Self-pay | Admitting: Obstetrics and Gynecology

## 2020-03-21 VITALS — BP 124/60 | HR 84 | Ht 67.5 in | Wt 186.6 lb

## 2020-03-21 DIAGNOSIS — R635 Abnormal weight gain: Secondary | ICD-10-CM | POA: Diagnosis not present

## 2020-03-21 DIAGNOSIS — Z5181 Encounter for therapeutic drug level monitoring: Secondary | ICD-10-CM

## 2020-03-21 DIAGNOSIS — N951 Menopausal and female climacteric states: Secondary | ICD-10-CM | POA: Diagnosis not present

## 2020-03-21 MED ORDER — VENLAFAXINE HCL ER 75 MG PO CP24: 75.0000 mg | ORAL_CAPSULE | Freq: Every day | ORAL | 2 refills | Status: DC

## 2020-03-22 ENCOUNTER — Telehealth: Payer: Self-pay

## 2020-03-22 MED ORDER — VENLAFAXINE HCL ER 75 MG PO CP24: 75.0000 mg | ORAL_CAPSULE | Freq: Every day | ORAL | 2 refills | Status: DC

## 2020-03-22 NOTE — Telephone Encounter (Signed)
Call placed to [facility] to f/u on Effexor 75mg  RX sent on 03/21/20.  Was advised their computer system has been down since 03/19/20, unable to confirm Rx. Was advised if patient needs Rx today, send to different location.   Call returned to patient. Advised as seen above. Patient request new Rx to [facility] on  [facility] and [address]. New Rx sent. Patient verbalizes understanding and is agreeable.   Routing to Dr. 12-06-1968.   Encounter closed.

## 2020-03-22 NOTE — Telephone Encounter (Signed)
Patient is calling in regards to a prescription. Patient states she was seen yesterday and a prescription was supposed to be called in but the pharmacy has not received it.

## 2020-03-29 ENCOUNTER — Other Ambulatory Visit: Payer: Self-pay

## 2020-03-29 ENCOUNTER — Ambulatory Visit: Payer: BC Managed Care – PPO | Admitting: Family

## 2020-03-29 ENCOUNTER — Encounter: Payer: Self-pay | Admitting: Family

## 2020-03-29 VITALS — BP 114/84 | HR 89 | Temp 98.7°F | Ht 67.5 in | Wt 183.0 lb

## 2020-03-29 DIAGNOSIS — R635 Abnormal weight gain: Secondary | ICD-10-CM | POA: Diagnosis not present

## 2020-03-29 DIAGNOSIS — E669 Obesity, unspecified: Secondary | ICD-10-CM

## 2020-03-29 NOTE — Progress Notes (Signed)
Kristie Baldwin is a 49 y.o. female with the following history as recorded in EpicCare:  Patient Active Problem List   Diagnosis Date Noted  . Weight loss counseling, encounter for 12/17/2017  . Obesity 06/04/2017  . NAFLD (nonalcoholic fatty liver disease) 50/93/2671    Current Outpatient Medications  Medication Sig Dispense Refill  . Ascorbic Acid (VITAMIN C) 1000 MG tablet Take 1,000 mg by mouth daily.    Marland Kitchen BIOTIN PO Take by mouth.    . Calcium Carb-Cholecalciferol (CALCIUM 1000 + D PO) Take by mouth.    . Cholecalciferol (VITAMIN D3 PO) Take by mouth.    . COLLAGEN PO Take 1 tablet by mouth daily.    . Cyanocobalamin (B-12 PO) Take by mouth.    . phentermine 37.5 MG capsule Take 1 capsule (37.5 mg total) by mouth every morning. 30 capsule 0  . venlafaxine XR (EFFEXOR XR) 75 MG 24 hr capsule Take 1 capsule (75 mg total) by mouth daily with breakfast. 30 capsule 2  . valACYclovir (VALTREX) 500 MG tablet Take 1 tablet (500 mg total) by mouth 2 (two) times daily. For 3 days as needed (Patient not taking: Reported on 03/29/2020) 30 tablet 2   No current facility-administered medications for this visit.    Allergies: Monistat [miconazole]  Past Medical History:  Diagnosis Date  . Depression   . Dysmenorrhea   . Peptic ulcer   . STD (sexually transmitted disease)    HSV    History reviewed. No pertinent surgical history.  Family History  Problem Relation Age of Onset  . Hypertension Mother   . Arthritis Mother        OA  . Atrial fibrillation Mother        medication  . Heart Problems Father        pace maker 2018  . Prostate cancer Father   . Atrial fibrillation Father   . Breast cancer Neg Hx   . Colon cancer Neg Hx   . Stomach cancer Neg Hx   . Pancreatic cancer Neg Hx     Social History   Tobacco Use  . Smoking status: Former Smoker    Years: 0.00    Quit date: 08/11/1991    Years since quitting: 28.6  . Smokeless tobacco: Never Used  Substance Use Topics   . Alcohol use: Yes    Alcohol/week: 4.0 standard drinks    Types: 4 Standard drinks or equivalent per week    Comment: social    Subjective:  2nd month of Phentermine; has only lost about 3 pounds; notes she is very frustrated- suspects related to currently going through menopause; Would like to get information for weight loss clinic; No other acute concerns today;   Objective:  Vitals:   03/29/20 0840  BP: 114/84  Pulse: 89  Temp: 98.7 F (37.1 C)  TempSrc: Oral  SpO2: 99%  Weight: 183 lb (83 kg)  Height: 5' 7.5" (1.715 m)    General: Well developed, well nourished, in no acute distress  Head: Normocephalic and atraumatic  Lungs: Respirations unlabored;  Neurologic: Alert and oriented; speech intact; face symmetrical; moves all extremities well; CNII-XII intact without focal deficit  Assessment:  1. Weight gain   2. Obesity without serious comorbidity, unspecified classification, unspecified obesity type     Plan:   Will not continue Phentermine at this time; will refer patient to weight loss provider and will have her follow-up there; patient in agreement with this plan; at this time,  she defers any type of hormones;  This visit occurred during the SARS-CoV-2 public health emergency.  Safety protocols were in place, including screening questions prior to the visit, additional usage of staff PPE, and extensive cleaning of exam room while observing appropriate contact time as indicated for disinfecting solutions.    No follow-ups on file.  No orders of the defined types were placed in this encounter.   Requested Prescriptions    No prescriptions requested or ordered in this encounter

## 2020-04-03 ENCOUNTER — Encounter: Payer: Self-pay | Admitting: Family

## 2020-04-03 DIAGNOSIS — H18831 Recurrent erosion of cornea, right eye: Secondary | ICD-10-CM | POA: Diagnosis not present

## 2020-04-09 ENCOUNTER — Encounter: Payer: Self-pay | Admitting: Obstetrics and Gynecology

## 2020-04-10 ENCOUNTER — Telehealth: Payer: Self-pay

## 2020-04-10 ENCOUNTER — Ambulatory Visit (INDEPENDENT_AMBULATORY_CARE_PROVIDER_SITE_OTHER): Payer: BC Managed Care – PPO | Admitting: Psychology

## 2020-04-10 DIAGNOSIS — F331 Major depressive disorder, recurrent, moderate: Secondary | ICD-10-CM

## 2020-04-10 DIAGNOSIS — F411 Generalized anxiety disorder: Secondary | ICD-10-CM

## 2020-04-10 NOTE — Telephone Encounter (Signed)
Pt sent following Mychart message:   Kristie Baldwin, Kristie Baldwin Clinical Pool Hi! I started taking 75 mgs of venlafaxine on 03/26/2020. Every since this; I feel like I'm a zombie. All I want to do is sleep. Is this normal? Will this subside? I do have a couple of hot flashes a day but NOTHING like it was. So it is helping that. Just wanted to know about the sleepy/tired feelings I am having - alot.    thanks!  Lew Dawes

## 2020-04-10 NOTE — Telephone Encounter (Signed)
Left message for pt to return call to triage RN. 

## 2020-04-11 NOTE — Telephone Encounter (Signed)
Spoke with pt. Pt sent mychart message re: update to Dr Edward Jolly for newly increased Rx.  Pt states taking 75 mg of Effexor and is really sleepy all time now and still having a few hot flashes at night, but better than before. Pt states started 75 mg Rx on 03/26/20.  Pt wanting to know what is next steps in plan of care for hot flashes or does she need to give increased dose time?   Advised pt will review with Dr Edward Jolly and return call with recommendations/advice. Pt agreeable.   Routing to Dr Edward Jolly.

## 2020-04-11 NOTE — Telephone Encounter (Signed)
Patient returned call

## 2020-04-12 NOTE — Telephone Encounter (Signed)
I would recommend she return back to a lower dosage of Effexor, 37.5 mg daily.  She can continue to use Estroven to supplement the Effexor for treatment of hot flashes.  The goal of treatment is to reduce hot flashes, but not necessarily eliminate all of them.   Please let me know if the patient has any concerns.

## 2020-04-12 NOTE — Telephone Encounter (Signed)
Spoke with patient, advised as seen below per Dr. Edward Jolly.  Patient states the 37.5 mg was not effective and 75 mg makes her groggy. Patient states she is not concerned about the hot flashes, states she just feels "blah". States she would prefer to continue current dosage for now. Recommended f/u with Dr. Edward Jolly to further discuss, patient agreeable to schedule MyChart visit. MyChart visit scheduled for 9/21 at 11:30am. Patient declined earlier appts offered on 9/8 and 9/9.    Routing to provider for final review. Patient is agreeable to disposition. Will close encounter.

## 2020-04-17 ENCOUNTER — Telehealth: Payer: Self-pay

## 2020-04-17 DIAGNOSIS — M9905 Segmental and somatic dysfunction of pelvic region: Secondary | ICD-10-CM | POA: Diagnosis not present

## 2020-04-17 DIAGNOSIS — M62830 Muscle spasm of back: Secondary | ICD-10-CM | POA: Diagnosis not present

## 2020-04-17 DIAGNOSIS — M9903 Segmental and somatic dysfunction of lumbar region: Secondary | ICD-10-CM | POA: Diagnosis not present

## 2020-04-17 DIAGNOSIS — M9902 Segmental and somatic dysfunction of thoracic region: Secondary | ICD-10-CM | POA: Diagnosis not present

## 2020-04-17 NOTE — Telephone Encounter (Signed)
Patient cancelled mychart med check. Patient stated she would like to wait until her AEX to discuss.

## 2020-04-17 NOTE — Telephone Encounter (Signed)
Routing to Dr Edward Jolly, please advise and recommend   AEX 05/30/20 Med check was for Effexor 37.5 mg

## 2020-04-17 NOTE — Telephone Encounter (Signed)
Ok to do her med recheck at her annual exam.

## 2020-04-17 NOTE — Telephone Encounter (Signed)
Left detailed message to pt. Pt to return call if any questions or concerns.  Encounter closed.

## 2020-04-18 DIAGNOSIS — Z20822 Contact with and (suspected) exposure to covid-19: Secondary | ICD-10-CM | POA: Diagnosis not present

## 2020-04-18 DIAGNOSIS — Z03818 Encounter for observation for suspected exposure to other biological agents ruled out: Secondary | ICD-10-CM | POA: Diagnosis not present

## 2020-04-25 ENCOUNTER — Ambulatory Visit (INDEPENDENT_AMBULATORY_CARE_PROVIDER_SITE_OTHER): Payer: BC Managed Care – PPO | Admitting: Psychology

## 2020-04-25 DIAGNOSIS — F411 Generalized anxiety disorder: Secondary | ICD-10-CM | POA: Diagnosis not present

## 2020-04-30 ENCOUNTER — Telehealth: Payer: Self-pay | Admitting: Obstetrics and Gynecology

## 2020-05-02 ENCOUNTER — Telehealth: Payer: Self-pay

## 2020-05-02 ENCOUNTER — Encounter: Payer: Self-pay | Admitting: Obstetrics and Gynecology

## 2020-05-02 NOTE — Telephone Encounter (Signed)
Spoke with pt. Pt would like to make another mychart visit with Dr Edward Jolly ahead of AEX scheduled for 05/30/20.  Pt scheduled with Dr Edward Jolly as mychart visit on 9/30 at 1130 am. Pt agreeable to date and time of appt.  Routing to Dr Edward Jolly for review.  Encounter closed

## 2020-05-02 NOTE — Telephone Encounter (Signed)
Pt sent following mychart message:  Kristie Baldwin, [facility] Clinical Pool Hi! Its me again. I messaged on 8/31 and talked to a nurse - when the other nurse called me back; our conversation did not make alot of sense so we scheduled a video chat for 9/21. On 9/8 I canceled that chat because I come in for a visit in Oct and was hoping the side effects would calm down - they have not. I feel like I am a zombie all of the time. Can we reschedule a video chat? I don't like feeling this way.   Thanks!  [name]  [phone]

## 2020-05-03 ENCOUNTER — Ambulatory Visit: Payer: BC Managed Care – PPO | Admitting: Family

## 2020-05-03 ENCOUNTER — Other Ambulatory Visit: Payer: Self-pay

## 2020-05-03 ENCOUNTER — Encounter: Payer: Self-pay | Admitting: Family

## 2020-05-03 VITALS — BP 110/70 | HR 85 | Temp 97.6°F | Ht 67.5 in | Wt 191.0 lb

## 2020-05-03 DIAGNOSIS — L089 Local infection of the skin and subcutaneous tissue, unspecified: Secondary | ICD-10-CM

## 2020-05-03 MED ORDER — MUPIROCIN 2 % EX OINT: 1.0000 "application " | TOPICAL_OINTMENT | Freq: Two times a day (BID) | CUTANEOUS | 0 refills | Status: DC

## 2020-05-03 MED ORDER — VENLAFAXINE HCL ER 37.5 MG PO CP24: 37.5000 mg | ORAL_CAPSULE | Freq: Every day | ORAL | 0 refills | Status: DC

## 2020-05-03 NOTE — Addendum Note (Signed)
Addended by: Isabell Jarvis on: 05/03/2020 09:27 AM   Modules accepted: Orders

## 2020-05-03 NOTE — Progress Notes (Signed)
Shenita Trego is a 49 y.o. female with the following history as recorded in EpicCare:  Patient Active Problem List   Diagnosis Date Noted  . Weight loss counseling, encounter for 12/17/2017  . Obesity 06/04/2017  . NAFLD (nonalcoholic fatty liver disease) 16/05/9603    Current Outpatient Medications  Medication Sig Dispense Refill  . Ascorbic Acid (VITAMIN C) 1000 MG tablet Take 1,000 mg by mouth daily.    Marland Kitchen BIOTIN PO Take by mouth.    . Calcium Carb-Cholecalciferol (CALCIUM 1000 + D PO) Take by mouth.    . Cholecalciferol (VITAMIN D3 PO) Take by mouth.    . COLLAGEN PO Take 1 tablet by mouth daily.    . Cyanocobalamin (B-12 PO) Take by mouth.    . valACYclovir (VALTREX) 500 MG tablet Take 1 tablet (500 mg total) by mouth 2 (two) times daily. For 3 days as needed 30 tablet 2  . venlafaxine XR (EFFEXOR XR) 37.5 MG 24 hr capsule Take 1 capsule (37.5 mg total) by mouth daily with breakfast. 30 capsule 0  . mupirocin ointment (BACTROBAN) 2 % Apply 1 application topically 2 (two) times daily. 22 g 0  . phentermine 37.5 MG capsule Take 1 capsule (37.5 mg total) by mouth every morning. 30 capsule 0   No current facility-administered medications for this visit.    Allergies: Monistat [miconazole]  Past Medical History:  Diagnosis Date  . Depression   . Dysmenorrhea   . Peptic ulcer   . STD (sexually transmitted disease)    HSV    No past surgical history on file.  Family History  Problem Relation Age of Onset  . Hypertension Mother   . Arthritis Mother        OA  . Atrial fibrillation Mother        medication  . Heart Problems Father        pace maker 2018  . Prostate cancer Father   . Atrial fibrillation Father   . Breast cancer Neg Hx   . Colon cancer Neg Hx   . Stomach cancer Neg Hx   . Pancreatic cancer Neg Hx     Social History   Tobacco Use  . Smoking status: Former Smoker    Years: 0.00    Quit date: 08/11/1991    Years since quitting: 28.7  . Smokeless  tobacco: Never Used  Substance Use Topics  . Alcohol use: Yes    Alcohol/week: 4.0 standard drinks    Types: 4 Standard drinks or equivalent per week    Comment: social    Subjective:  Patient got a tattoo on September 11- within 24 hours noticed area of redness around tattoo; started using topical Hiba-Clens and area has continued to improve; states that area actually looks better today;   Objective:  Vitals:   05/03/20 1450  BP: 110/70  Pulse: 85  Temp: 97.6 F (36.4 C)  TempSrc: Oral  SpO2: 97%  Weight: 191 lb (86.6 kg)  Height: 5' 7.5" (1.715 m)    General: Well developed, well nourished, in no acute distress  Skin : Warm and dry. Healing tattoo on inner left arm- no warmth, redness or streaking noted Head: Normocephalic and atraumatic  Lungs: Respirations unlabored; Neurologic: Alert and oriented; speech intact; face symmetrical; moves all extremities well; CNII-XII intact without focal deficit   Assessment:  1. Skin infection     Plan:  Localized at site of recent tattoo- appears almost completely healed today; Rx for Bactroban apply bid  to affected area; follow up worse, no better.  This visit occurred during the SARS-CoV-2 public health emergency.  Safety protocols were in place, including screening questions prior to the visit, additional usage of staff PPE, and extensive cleaning of exam room while observing appropriate contact time as indicated for disinfecting solutions.     No follow-ups on file.  No orders of the defined types were placed in this encounter.   Requested Prescriptions   Signed Prescriptions Disp Refills  . mupirocin ointment (BACTROBAN) 2 % 22 g 0    Sig: Apply 1 application topically 2 (two) times daily.

## 2020-05-03 NOTE — Telephone Encounter (Signed)
Please have patient reduce her Effexor XR back down to 37.5 mg.

## 2020-05-03 NOTE — Telephone Encounter (Signed)
Left pt detailed message per DPR. Pt given update on Rx per Dr Edward Jolly. Pt to return call with any questions or concerns.  Addendum closed.

## 2020-05-03 NOTE — Telephone Encounter (Signed)
Spoke with pt. Pt returned call. Pt states does not have Rx Effexor 37.5 mg, only 75 mg capsules which cant be split in half.  Pt agreeable to change dosage and still keep Mychart visit for 9/30 with Dr Edward Jolly.   Rx Effexor XR 37.5mg  sent to pharmacy on file. #30, 0 RF.   Routing to Dr Edward Jolly for [facility] Encounter closed.

## 2020-05-08 NOTE — Progress Notes (Addendum)
GYNECOLOGY  VISIT   HPI: 49 y.o.   Married  Caucasian  female   G1P0010 with No LMP recorded. (Menstrual status: Perimenopausal).   here for medication follow up. My Chart video visit performed.    She confirms her identity with 2 identifiers.  She gives consent for the visit arranged by Gershon Crane, RN. She is in her conference room at work.  I am in my office.  Started at 11:49. Ended 11:59. Additional chart review done.   She has been on Effexor XR 75 mg daily and felt like a  "zombie." I recommended she reduce the dose to 37.5 mg. She is improved but not normal yet. She feels like she could sleep 24 hours per day.   When she was on Effexor 37.5 mg daily she felt like she did not care as much.  Hot flashes were fairly well controlled.   Her life in general feels better.   She used another antidepressant in the past, and does not remember what it was.   She is seeing her therapist.  She does have anniversary events that affect her.  She saw a psychiatrist in the past 20 years ago.   GYNECOLOGIC HISTORY: No LMP recorded. (Menstrual status: Perimenopausal). Contraception: None Menopausal hormone therapy:  none Last mammogram: 10-20-2019 density b birads 1:neg Last pap smear: 09-16-15 neg HPV HR neg, 10-18-08 Neg        OB History    Gravida  1   Para  0   Term  0   Preterm  0   AB  1   Living  0     SAB  0   TAB  1   Ectopic  0   Multiple  0   Live Births  0              Patient Active Problem List   Diagnosis Date Noted  . Weight loss counseling, encounter for 12/17/2017  . Obesity 06/04/2017  . NAFLD (nonalcoholic fatty liver disease) 64/68/0321    Past Medical History:  Diagnosis Date  . Depression   . Dysmenorrhea   . Peptic ulcer   . STD (sexually transmitted disease)    HSV    No past surgical history on file.  Current Outpatient Medications  Medication Sig Dispense Refill  . Ascorbic Acid (VITAMIN C) 1000 MG tablet Take  1,000 mg by mouth daily.    Marland Kitchen BIOTIN PO Take by mouth.    . Calcium Carb-Cholecalciferol (CALCIUM 1000 + D PO) Take by mouth.    . Cholecalciferol (VITAMIN D3 PO) Take by mouth.    . COLLAGEN PO Take 1 tablet by mouth daily.    . Cyanocobalamin (B-12 PO) Take by mouth.    . mupirocin ointment (BACTROBAN) 2 % Apply 1 application topically 2 (two) times daily. 22 g 0  . valACYclovir (VALTREX) 500 MG tablet Take 1 tablet (500 mg total) by mouth 2 (two) times daily. For 3 days as needed 30 tablet 2  . venlafaxine XR (EFFEXOR XR) 37.5 MG 24 hr capsule Take 1 capsule (37.5 mg total) by mouth daily with breakfast. 30 capsule 0   No current facility-administered medications for this visit.     ALLERGIES: Monistat [miconazole]  Family History  Problem Relation Age of Onset  . Hypertension Mother   . Arthritis Mother        OA  . Atrial fibrillation Mother        medication  . Heart Problems  Father        pace maker 2018  . Prostate cancer Father   . Atrial fibrillation Father   . Breast cancer Neg Hx   . Colon cancer Neg Hx   . Stomach cancer Neg Hx   . Pancreatic cancer Neg Hx     Social History   Socioeconomic History  . Marital status: Married    Spouse name: Not on file  . Number of children: 0  . Years of education: Not on file  . Highest education level: Not on file  Occupational History    Employer: [employer]  Tobacco Use  . Smoking status: Former Smoker    Years: 0.00    Quit date: 08/11/1991    Years since quitting: 28.7  . Smokeless tobacco: Never Used  Vaping Use  . Vaping Use: Never used  Substance and Sexual Activity  . Alcohol use: Yes    Alcohol/week: 4.0 standard drinks    Types: 4 Standard drinks or equivalent per week    Comment: social  . Drug use: No  . Sexual activity: Not Currently    Partners: Male    Birth control/protection: Abstinence  Other Topics Concern  . Not on file  Social History Narrative  . Not on file   Social Determinants  of Health   Financial Resource Strain:   . Difficulty of Paying Living Expenses: Not on file  Food Insecurity:   . Worried About Programme researcher, broadcasting/film/video in the Last Year: Not on file  . Ran Out of Food in the Last Year: Not on file  Transportation Needs:   . Lack of Transportation (Medical): Not on file  . Lack of Transportation (Non-Medical): Not on file  Physical Activity:   . Days of Exercise per Week: Not on file  . Minutes of Exercise per Session: Not on file  Stress:   . Feeling of Stress : Not on file  Social Connections:   . Frequency of Communication with Friends and Family: Not on file  . Frequency of Social Gatherings with Friends and Family: Not on file  . Attends Religious Services: Not on file  . Active Member of Clubs or Organizations: Not on file  . Attends Banker Meetings: Not on file  . Marital Status: Not on file  Intimate Partner Violence:   . Fear of Current or Ex-Partner: Not on file  . Emotionally Abused: Not on file  . Physically Abused: Not on file  . Sexually Abused: Not on file    Review of Systems  Constitutional: Negative.   HENT: Negative.   Eyes: Negative.   Respiratory: Negative.   Cardiovascular: Negative.   Gastrointestinal: Negative.   Endocrine: Negative.   Genitourinary: Negative.   Musculoskeletal: Negative.   Skin: Negative.   Allergic/Immunologic: Negative.   Neurological: Negative.   Hematological: Negative.   Psychiatric/Behavioral: Negative.     PHYSICAL EXAMINATION:    There were no vitals taken for this visit.    General appearance: alert, cooperative and appears stated age.  ASSESSMENT  Menopausal symptoms.  Depression.   PLAN  Continue Effexor XR 37.5 mg daily.  Will refer to psychiatry.  She has her annual exam in one month.

## 2020-05-09 ENCOUNTER — Ambulatory Visit (INDEPENDENT_AMBULATORY_CARE_PROVIDER_SITE_OTHER): Payer: BC Managed Care – PPO | Admitting: Psychology

## 2020-05-09 ENCOUNTER — Encounter: Payer: Self-pay | Admitting: Obstetrics and Gynecology

## 2020-05-09 ENCOUNTER — Telehealth (INDEPENDENT_AMBULATORY_CARE_PROVIDER_SITE_OTHER): Payer: BC Managed Care – PPO | Admitting: Obstetrics and Gynecology

## 2020-05-09 DIAGNOSIS — N951 Menopausal and female climacteric states: Secondary | ICD-10-CM | POA: Diagnosis not present

## 2020-05-09 DIAGNOSIS — F329 Major depressive disorder, single episode, unspecified: Secondary | ICD-10-CM | POA: Diagnosis not present

## 2020-05-09 DIAGNOSIS — F411 Generalized anxiety disorder: Secondary | ICD-10-CM | POA: Diagnosis not present

## 2020-05-09 DIAGNOSIS — F32A Depression, unspecified: Secondary | ICD-10-CM

## 2020-05-09 MED ORDER — VENLAFAXINE HCL ER 37.5 MG PO CP24: 37.5000 mg | ORAL_CAPSULE | Freq: Every day | ORAL | 0 refills | Status: DC

## 2020-05-15 DIAGNOSIS — M9903 Segmental and somatic dysfunction of lumbar region: Secondary | ICD-10-CM | POA: Diagnosis not present

## 2020-05-15 DIAGNOSIS — M9902 Segmental and somatic dysfunction of thoracic region: Secondary | ICD-10-CM | POA: Diagnosis not present

## 2020-05-15 DIAGNOSIS — M9905 Segmental and somatic dysfunction of pelvic region: Secondary | ICD-10-CM | POA: Diagnosis not present

## 2020-05-15 DIAGNOSIS — M62830 Muscle spasm of back: Secondary | ICD-10-CM | POA: Diagnosis not present

## 2020-05-20 DIAGNOSIS — D485 Neoplasm of uncertain behavior of skin: Secondary | ICD-10-CM | POA: Diagnosis not present

## 2020-05-20 DIAGNOSIS — L718 Other rosacea: Secondary | ICD-10-CM | POA: Diagnosis not present

## 2020-05-21 DIAGNOSIS — M9902 Segmental and somatic dysfunction of thoracic region: Secondary | ICD-10-CM | POA: Diagnosis not present

## 2020-05-21 DIAGNOSIS — M9905 Segmental and somatic dysfunction of pelvic region: Secondary | ICD-10-CM | POA: Diagnosis not present

## 2020-05-21 DIAGNOSIS — M9903 Segmental and somatic dysfunction of lumbar region: Secondary | ICD-10-CM | POA: Diagnosis not present

## 2020-05-21 DIAGNOSIS — M62830 Muscle spasm of back: Secondary | ICD-10-CM | POA: Diagnosis not present

## 2020-05-22 ENCOUNTER — Telehealth: Payer: Self-pay

## 2020-05-22 ENCOUNTER — Ambulatory Visit: Payer: BC Managed Care – PPO | Admitting: Psychology

## 2020-05-22 ENCOUNTER — Encounter: Payer: Self-pay | Admitting: Obstetrics and Gynecology

## 2020-05-22 NOTE — Telephone Encounter (Signed)
Patient is returning call.  °

## 2020-05-22 NOTE — Telephone Encounter (Signed)
Spoke with patient regarding calling [facility] to schedule appointment. Patient requesting phone number be sent to her via MyChart as she is currently unable to write it down.  Encounter closed.  Following message sent to patient via MyChart.  Hi Tashaya,  Per our telephone conversation, I have attached the phone number to Dr. Iona Hansen Cottle's office, [facility]. Please feel free to give our office a call back with any further questions or if you are needing assistance with scheduling.  [facility] [address], [address] [place], [place] [address] [phone]  Thank you so much and have a great evening,  Lizzett Nobile

## 2020-05-22 NOTE — Telephone Encounter (Signed)
Pt sent following mychart message:  Kristie Baldwin, [facility] Good morning! Just wanted to follow up about the referral we discussed on that call. I have not heard from anyone and I just wanted to see where we were.   Thanks!  Lew Dawes

## 2020-05-22 NOTE — Telephone Encounter (Signed)
Routing to [facility] for referral

## 2020-05-22 NOTE — Telephone Encounter (Signed)
Spoke with receptionist at [facility]. Stated that voicemail had been left for patient last week regarding calling back to schedule. To date, patient has not returned call to schedule appointment.  Call to patient. Per DPR, OK to leave voicemail. Left voicemail requesting a return call to [facility] at [phone] regarding MyChart message.

## 2020-05-30 ENCOUNTER — Ambulatory Visit: Payer: BC Managed Care – PPO | Admitting: Obstetrics and Gynecology

## 2020-06-04 DIAGNOSIS — M9902 Segmental and somatic dysfunction of thoracic region: Secondary | ICD-10-CM | POA: Diagnosis not present

## 2020-06-04 DIAGNOSIS — M9903 Segmental and somatic dysfunction of lumbar region: Secondary | ICD-10-CM | POA: Diagnosis not present

## 2020-06-04 DIAGNOSIS — M62830 Muscle spasm of back: Secondary | ICD-10-CM | POA: Diagnosis not present

## 2020-06-04 DIAGNOSIS — M9905 Segmental and somatic dysfunction of pelvic region: Secondary | ICD-10-CM | POA: Diagnosis not present

## 2020-06-05 ENCOUNTER — Ambulatory Visit: Payer: BC Managed Care – PPO | Admitting: Psychology

## 2020-06-27 ENCOUNTER — Other Ambulatory Visit: Payer: Self-pay | Admitting: Obstetrics and Gynecology

## 2020-06-28 ENCOUNTER — Ambulatory Visit: Payer: BC Managed Care – PPO | Admitting: Obstetrics and Gynecology

## 2020-07-01 ENCOUNTER — Telehealth: Payer: Self-pay

## 2020-07-01 ENCOUNTER — Encounter: Payer: Self-pay | Admitting: Obstetrics and Gynecology

## 2020-07-01 NOTE — Telephone Encounter (Signed)
Pt sent the following mychart message:  Amee, [facility] Hi! I had tried to refill this and there were not any more refills. [facility] said they have been trying to get approval since last Thursday - not sure if that is really the case. Just wanted to touch base with you. I had to reschedule my yearly exam from last week because I had a fever/throwing up/diarrhea from 11/13 - 11/17 and still did not feel well so that is why I had to reschedule. The first available was April 2022 - so I'm not sure had you would like to proceed. Just let me know. thanks!

## 2020-07-01 NOTE — Telephone Encounter (Signed)
Left message for pt to return call to triage RN. 

## 2020-07-03 ENCOUNTER — Other Ambulatory Visit: Payer: Self-pay | Admitting: Obstetrics and Gynecology

## 2020-07-03 MED ORDER — VENLAFAXINE HCL ER 37.5 MG PO CP24: 37.5000 mg | ORAL_CAPSULE | Freq: Every day | ORAL | 0 refills | Status: DC

## 2020-07-03 NOTE — Telephone Encounter (Signed)
Left message for pt to return call to triage RN. 

## 2020-07-03 NOTE — Telephone Encounter (Signed)
AEX 03/2018 with BS, next scheduled for 11/30 at 8am with KD. OV 03/21/20 for med check on Effexor XR Perimenopause H/o Depression  Spoke with pt. Pt states needing Effexor Rx refill. Pt states has been out x 1 week. Pt states cancelled AEX on 11/19 with Dr Edward Jolly due to being sick with stomach bug. Pt states stopped taking Effexor while sick, but is also out.  Pt states took Estroven OTC x 1 dose. Pt states still having hot flashes as well while taking Effexor or Estroven. Pt taking Effexor for menopause sx and depression.   Advised pt will review call with Dr Edward Jolly and return call if medication refill approved. Pt agreeable. Pharmacy verified.   Routing to Dr Edward Jolly for review and possible Rx refill. Please advise   Pt need Rx refill or wait for AEX with Tresa Endo, NP on 11/30?   Cc: Tresa Endo, NP for review

## 2020-07-03 NOTE — Telephone Encounter (Signed)
Rx for Effexor 37.5 mg refilled for one month.  I would be happy to see her for her annual exam if both of our schedules allow this.

## 2020-07-03 NOTE — Telephone Encounter (Signed)
Call to patient. Left message advising refill sent by Dr Edward Jolly.  Left message appointment is available with Dr Edward Jolly Tuesday at 2pm. Call back if desires this ot can find alternate option.  Encounter closed.

## 2020-07-08 ENCOUNTER — Telehealth: Payer: Self-pay

## 2020-07-08 NOTE — Progress Notes (Signed)
49 y.o. G35P0010 Married White or Caucasian female here for annual exam.    Still having hot flashes, wanting to discuss estrogen Effexor was working okay at first but not as effective anymore. She is a little concerned about increasing dose because she did not feel good when dose was increased earlier this year.  Reports has a stressful job at [employer].    LMP 10/05/2019       Sexually active: No.  The current method of family planning is post menopausal status.    Exercising: Yes.    walking Smoker:  no  Health Maintenance: Pap: 2014-WNL,  09-16-15 neg HPV HR neg  History of abnormal Pap:  yes MMG:  10-20-2019 category b density birads 1:neg Colonoscopy:  none BMD:   none TDaP:  2013 Gardasil:   n/a Covid-19: pfizer done  Pneumonia vaccine(s):  Not done Shingrix:   Not done Hep C testing: neg 2018 Screening Labs: PCP   reports that she quit smoking about 28 years ago. She quit after 0.00 years of use. She has never used smokeless tobacco. She reports previous alcohol use. She reports that she does not use drugs.  Past Medical History:  Diagnosis Date  . Abnormal Pap smear of cervix   . Depression   . Dysmenorrhea   . Peptic ulcer   . STD (sexually transmitted disease)    HSV    History reviewed. No pertinent surgical history.  Current Outpatient Medications  Medication Sig Dispense Refill  . Ascorbic Acid (VITAMIN C) 1000 MG tablet Take 1,000 mg by mouth daily.    Marland Kitchen BIOTIN PO Take by mouth.    . Calcium Carb-Cholecalciferol (CALCIUM 1000 + D PO) Take by mouth.    . Cholecalciferol (VITAMIN D3 PO) Take by mouth.    . COLLAGEN PO Take 1 tablet by mouth daily.    . Cyanocobalamin (B-12 PO) Take by mouth.    . valACYclovir (VALTREX) 500 MG tablet Take 1 tablet (500 mg total) by mouth 2 (two) times daily. For 3 days as needed 30 tablet 2  . venlafaxine XR (EFFEXOR XR) 37.5 MG 24 hr capsule Take 1 capsule (37.5 mg total) by mouth daily with breakfast. 30 capsule 0   No  current facility-administered medications for this visit.    Family History  Problem Relation Age of Onset  . Hypertension Mother   . Arthritis Mother        OA  . Atrial fibrillation Mother        medication  . Heart Problems Father        pace maker 2018  . Prostate cancer Father   . Atrial fibrillation Father   . Breast cancer Neg Hx   . Colon cancer Neg Hx   . Stomach cancer Neg Hx   . Pancreatic cancer Neg Hx     Review of Systems  Constitutional: Negative.   HENT: Negative.   Eyes: Negative.   Respiratory: Negative.   Cardiovascular: Negative.   Gastrointestinal: Negative.   Endocrine: Positive for heat intolerance.       Hotflashes  Genitourinary: Negative.   Musculoskeletal: Negative.   Skin: Negative.   Allergic/Immunologic: Negative.   Neurological: Negative.   Hematological: Negative.   Psychiatric/Behavioral: Negative.     Exam:   BP 114/68   Pulse 70   Resp 16   Ht 5' 7.25" (1.708 m)   Wt 181 lb (82.1 kg)   LMP 10/05/2019 (Exact Date)   BMI 28.14 kg/m  Height: 5' 7.25" (170.8 cm)  General appearance: alert, cooperative and appears stated age Head: Normocephalic, without obvious abnormality, atraumatic Neck: no adenopathy, supple, symmetrical, trachea midline and thyroid normal to inspection and palpation Lungs: clear to auscultation bilaterally Breasts: normal appearance, no masses or tenderness Heart: regular rate and rhythm Abdomen: soft, non-tender; bowel sounds normal; no masses,  no organomegaly Extremities: extremities normal, atraumatic, no cyanosis or edema Skin: Skin color, texture, turgor normal. No rashes or lesions Lymph nodes: Cervical, supraclavicular, and axillary nodes normal. No abnormal inguinal nodes palpated Neurologic: Grossly normal   Pelvic: External genitalia:  no lesions              Urethra:  normal appearing urethra with no masses, tenderness or lesions              Bartholins and Skenes: normal                  Vagina: normal appearing vagina with normal color and discharge, no lesions              Cervix: no cervical motion tenderness and no lesions              Pap taken: No. Bimanual Exam:  Uterus:  normal size, contour, position, consistency, mobility, non-tender              Adnexa: no mass, fullness, tenderness               Rectovaginal: Confirms               Anus:  normal sphincter tone, no lesions  Joy, CMA Chaperone was present for exam.  A:  Well Woman with normal exam  Vasomotor symptoms  Hx HSV   P:   Mammogram, pt to schedule 10/2020  pap smear due (cotesting) next year (2022)  Establish care with GI, will contact them in spring (first available break from work) to schedule screening colonoscopy  Start estradiol patch 0.05 twice weekly and Prometrium 100mg  daily. RX sent  Refill: valtrex and Effexor 37.5  F/U 2 months to discuss management of hot flashes.  (Pt is a little concerned about estrogen but wants relief, also concerned about increasing Effexor, will keep current dose and monitor symptoms)

## 2020-07-08 NOTE — Telephone Encounter (Signed)
Patient returning call to speak with Kennon Rounds.

## 2020-07-09 ENCOUNTER — Encounter: Payer: Self-pay | Admitting: Nurse Practitioner

## 2020-07-09 ENCOUNTER — Other Ambulatory Visit: Payer: Self-pay

## 2020-07-09 ENCOUNTER — Ambulatory Visit: Payer: BC Managed Care – PPO | Admitting: Nurse Practitioner

## 2020-07-09 VITALS — BP 114/68 | HR 70 | Resp 16 | Ht 67.25 in | Wt 181.0 lb

## 2020-07-09 DIAGNOSIS — N951 Menopausal and female climacteric states: Secondary | ICD-10-CM

## 2020-07-09 DIAGNOSIS — Z01419 Encounter for gynecological examination (general) (routine) without abnormal findings: Secondary | ICD-10-CM

## 2020-07-09 DIAGNOSIS — Z8619 Personal history of other infectious and parasitic diseases: Secondary | ICD-10-CM

## 2020-07-09 MED ORDER — PROGESTERONE MICRONIZED 100 MG PO CAPS: 100.0000 mg | ORAL_CAPSULE | Freq: Every day | ORAL | 4 refills | Status: DC

## 2020-07-09 MED ORDER — VALACYCLOVIR HCL 500 MG PO TABS: 500.0000 mg | ORAL_TABLET | Freq: Two times a day (BID) | ORAL | 2 refills | Status: DC

## 2020-07-09 MED ORDER — VENLAFAXINE HCL ER 37.5 MG PO CP24: 37.5000 mg | ORAL_CAPSULE | Freq: Every day | ORAL | 1 refills | Status: DC

## 2020-07-09 MED ORDER — ESTRADIOL 0.05 MG/24HR TD PTTW: 1.0000 | MEDICATED_PATCH | TRANSDERMAL | 4 refills | Status: DC

## 2020-07-09 NOTE — Patient Instructions (Addendum)
Health Maintenance, Female Adopting a healthy lifestyle and getting preventive care are important in promoting health and wellness. Ask your health care provider about:  The right schedule for you to have regular tests and exams.  Things you can do on your own to prevent diseases and keep yourself healthy. What should I know about diet, weight, and exercise? Eat a healthy diet   Eat a diet that includes plenty of vegetables, fruits, low-fat dairy products, and lean protein.  Do not eat a lot of foods that are high in solid fats, added sugars, or sodium. Maintain a healthy weight Body mass index (BMI) is used to identify weight problems. It estimates body fat based on height and weight. Your health care provider can help determine your BMI and help you achieve or maintain a healthy weight. Get regular exercise Get regular exercise. This is one of the most important things you can do for your health. Most adults should:  Exercise for at least 150 minutes each week. The exercise should increase your heart rate and make you sweat (moderate-intensity exercise).  Do strengthening exercises at least twice a week. This is in addition to the moderate-intensity exercise.  Spend less time sitting. Even light physical activity can be beneficial. Watch cholesterol and blood lipids Have your blood tested for lipids and cholesterol at 51 years of age, then have this test every 5 years. Have your cholesterol levels checked more often if:  Your lipid or cholesterol levels are high.  You are older than 51 years of age.  You are at high risk for heart disease. What should I know about cancer screening? Depending on your health history and family history, you may need to have cancer screening at various ages. This may include screening for:  Breast cancer.  Cervical cancer.  Colorectal cancer.  Skin cancer.  Lung cancer. What should I know about heart disease, diabetes, and high blood  pressure? Blood pressure and heart disease  High blood pressure causes heart disease and increases the risk of stroke. This is more likely to develop in people who have high blood pressure readings, are of African descent, or are overweight.  Have your blood pressure checked: ? Every 3-5 years if you are 18-39 years of age. ? Every year if you are 40 years old or older. Diabetes Have regular diabetes screenings. This checks your fasting blood sugar level. Have the screening done:  Once every three years after age 40 if you are at a normal weight and have a low risk for diabetes.  More often and at a younger age if you are overweight or have a high risk for diabetes. What should I know about preventing infection? Hepatitis B If you have a higher risk for hepatitis B, you should be screened for this virus. Talk with your health care provider to find out if you are at risk for hepatitis B infection. Hepatitis C Testing is recommended for:  Everyone born from 1945 through 1965.  Anyone with known risk factors for hepatitis C. Sexually transmitted infections (STIs)  Get screened for STIs, including gonorrhea and chlamydia, if: ? You are sexually active and are younger than 51 years of age. ? You are older than 51 years of age and your health care provider tells you that you are at risk for this type of infection. ? Your sexual activity has changed since you were last screened, and you are at increased risk for chlamydia or gonorrhea. Ask your health care provider if   you are at risk.  Ask your health care provider about whether you are at high risk for HIV. Your health care provider may recommend a prescription medicine to help prevent HIV infection. If you choose to take medicine to prevent HIV, you should first get tested for HIV. You should then be tested every 3 months for as long as you are taking the medicine. Pregnancy  If you are about to stop having your period (premenopausal) and  you may become pregnant, seek counseling before you get pregnant.  Take 400 to 800 micrograms (mcg) of folic acid every day if you become pregnant.  Ask for birth control (contraception) if you want to prevent pregnancy. Osteoporosis and menopause Osteoporosis is a disease in which the bones lose minerals and strength with aging. This can result in bone fractures. If you are 12 years old or older, or if you are at risk for osteoporosis and fractures, ask your health care provider if you should:  Be screened for bone loss.  Take a calcium or vitamin D supplement to lower your risk of fractures.  Be given hormone replacement therapy (HRT) to treat symptoms of menopause. Follow these instructions at home: Lifestyle  Do not use any products that contain nicotine or tobacco, such as cigarettes, e-cigarettes, and chewing tobacco. If you need help quitting, ask your health care provider.  Do not use street drugs.  Do not share needles.  Ask your health care provider for help if you need support or information about quitting drugs. Alcohol use  Do not drink alcohol if: ? Your health care provider tells you not to drink. ? You are pregnant, may be pregnant, or are planning to become pregnant.  If you drink alcohol: ? Limit how much you use to 0-1 drink a day. ? Limit intake if you are breastfeeding.  Be aware of how much alcohol is in your drink. In the [place] one drink equals one 12 oz bottle of beer (355 mL), one 5 oz glass of wine (148 mL), or one 1 oz glass of hard liquor (44 mL). General instructions  Schedule regular health, dental, and eye exams.  Stay current with your vaccines.  Tell your health care provider if: ? You often feel depressed. ? You have ever been abused or do not feel safe at home. Summary  Adopting a healthy lifestyle and getting preventive care are important in promoting health and wellness.  Follow your health care provider's instructions about healthy  diet, exercising, and getting tested or screened for diseases.  Follow your health care provider's instructions on monitoring your cholesterol and blood pressure. This information is not intended to replace advice given to you by your health care provider. Make sure you discuss any questions you have with your health care provider. Document Revised: 07/20/2018 Document Reviewed: 07/20/2018 Elsevier Patient Education  2020 Else[employer] Inc. Estradiol skin patches What is this medicine? ESTRADIOL (es tra DYE ole) skin patches contain an estrogen. It is mostly used as hormone replacement in menopausal women. It helps to treat hot flashes and prevent osteoporosis. It is also used to treat women with low estrogen levels or those who have had their ovaries removed. This medicine may be used for other purposes; ask your health care provider or pharmacist if you have questions. COMMON BRAND NAME(S): Alora, Climara, DOTTI, Esclim, Estraderm, FemPatch, Menostar, Minivelle, Vivelle, Vivelle-Dot What should I tell my health care provider before I take this medicine? They need to know if you have any of  these conditions:  abnormal vaginal bleeding  blood vessel disease or blood clots  breast, cervical, endometrial, ovarian, liver, or uterine cancer  dementia  diabetes  gallbladder disease  heart disease or recent heart attack  high blood pressure  high cholesterol  high level of calcium in the blood  hysterectomy  kidney disease  liver disease  migraine headaches  protein C deficiency  protein S deficiency  stroke  systemic lupus erythematosus (SLE)  tobacco smoker  an unusual or allergic reaction to estrogens, other hormones, medicines, foods, dyes, or preservatives  pregnant or trying to get pregnant  breast-feeding How should I use this medicine? This medicine is for external use only. Follow the directions on the prescription label. Tear open the pouch, do not use  scissors. Remove the stiff protective liner covering the adhesive. Try not to touch the adhesive. Apply the patch, sticky side to the skin, to an area that is clean, dry and hairless. Avoid injured, irritated, calloused, or scarred areas. Do not apply the skin patches to your breasts or around the waistline. Use a different site each time to prevent skin irritation. Do not cut or trim the patch. Do not stop using except on the advice of your doctor or health care professional. Do not wear more than one patch at a time unless you are told to do so by your doctor or health care professional. Contact your pediatrician regarding the use of this medicine in children. Special care may be needed. A patient package insert for the product will be given with each prescription and refill. Read this sheet carefully each time. The sheet may change frequently. Overdosage: If you think you have taken too much of this medicine contact a poison control center or emergency room at once. NOTE: This medicine is only for you. Do not share this medicine with others. What if I miss a dose? If you miss a dose, apply it as soon as you can. If it is almost time for your next dose, apply only that dose. Do not apply double or extra doses. What may interact with this medicine? Do not take this medicine with any of the following medications:  aromatase inhibitors like aminoglutethimide, anastrozole, exemestane, letrozole, testolactone This medicine may also interact with the following medications:  carbamazepine  certain antibiotics used to treat infections  certain barbiturates used for inducing sleep or treating seizures  grapefruit juice  medicines for fungus infections like itraconazole and ketoconazole  raloxifene or tamoxifen  rifabutin, rifampin, or rifapentine  ritonavir  St. John's Wort This list may not describe all possible interactions. Give your health care provider a list of all the medicines, herbs,  non-prescription drugs, or dietary supplements you use. Also tell them if you smoke, drink alcohol, or use illegal drugs. Some items may interact with your medicine. What should I watch for while using this medicine? Visit your doctor or health care professional for regular checks on your progress. You will need a regular breast and pelvic exam and Pap smear while on this medicine. You should also discuss the need for regular mammograms with your health care professional, and follow his or her guidelines for these tests. This medicine can make your body retain fluid, making your fingers, hands, or ankles swell. Your blood pressure can go up. Contact your doctor or health care professional if you feel you are retaining fluid. If you have any reason to think you are pregnant, stop taking this medicine right away and contact your doctor  or health care professional. Smoking increases the risk of getting a blood clot or having a stroke while you are taking this medicine, especially if you are more than 51 years old. You are strongly advised not to smoke. If you wear contact lenses and notice visual changes, or if the lenses begin to feel uncomfortable, consult your eye doctor or health care professional. This medicine can increase the risk of developing a condition (endometrial hyperplasia) that may lead to cancer of the lining of the uterus. Taking progestins, another hormone drug, with this medicine lowers the risk of developing this condition. Therefore, if your uterus has not been removed (by a hysterectomy), your doctor may prescribe a progestin for you to take together with your estrogen. You should know, however, that taking estrogens with progestins may have additional health risks. You should discuss the use of estrogens and progestins with your health care professional to determine the benefits and risks for you. If you are going to have surgery or an MRI, you may need to stop taking this medicine.  Consult your health care professional for advice before you schedule the surgery. Contact with water while you are swimming, using a sauna, bathing, or showering may cause the patch to fall off. If your patch falls off reapply it. If you cannot reapply the patch, apply a new patch to another area and continue to follow your usual dose schedule. What side effects may I notice from receiving this medicine? Side effects that you should report to your doctor or health care professional as soon as possible:  allergic reactions like skin rash, itching or hives, swelling of the face, lips, or tongue  breast tissue changes or discharge  changes in vision  chest pain  confusion, trouble speaking or understanding  dark urine  general ill feeling or flu-like symptoms  light-colored stools  nausea, vomiting  pain, swelling, warmth in the leg  right upper belly pain  severe headaches  shortness of breath  sudden numbness or weakness of the face, arm or leg  trouble walking, dizziness, loss of balance or coordination  unusual vaginal bleeding  yellowing of the eyes or skin Side effects that usually do not require medical attention (report to your doctor or health care professional if they continue or are bothersome):  hair loss  increased hunger or thirst  increased urination  symptoms of vaginal infection like itching, irritation or unusual discharge  unusually weak or tired This list may not describe all possible side effects. Call your doctor for medical advice about side effects. You may report side effects to FDA at 1-800-FDA-1088. Where should I keep my medicine? Keep out of the reach of children. Store at room temperature below 30 degrees C (86 degrees F). Do not store any patches that have been removed from their protective pouch. Throw away any unused medicine after the expiration date. Dispose of used patches properly. Since used patches may still contain active  hormones, fold the patch in half so that it sticks to itself prior to disposal. NOTE: This sheet is a summary. It may not cover all possible information. If you have questions about this medicine, talk to your doctor, pharmacist, or health care provider.  2020 Elsevier/Gold Standard (2016-02-18 12:58:11)

## 2020-07-18 DIAGNOSIS — D3131 Benign neoplasm of right choroid: Secondary | ICD-10-CM | POA: Diagnosis not present

## 2020-07-22 DIAGNOSIS — M62830 Muscle spasm of back: Secondary | ICD-10-CM | POA: Diagnosis not present

## 2020-07-22 DIAGNOSIS — M9902 Segmental and somatic dysfunction of thoracic region: Secondary | ICD-10-CM | POA: Diagnosis not present

## 2020-07-22 DIAGNOSIS — M9905 Segmental and somatic dysfunction of pelvic region: Secondary | ICD-10-CM | POA: Diagnosis not present

## 2020-07-22 DIAGNOSIS — M9903 Segmental and somatic dysfunction of lumbar region: Secondary | ICD-10-CM | POA: Diagnosis not present

## 2020-07-31 ENCOUNTER — Ambulatory Visit: Payer: BC Managed Care – PPO | Admitting: Family

## 2020-07-31 ENCOUNTER — Encounter: Payer: Self-pay | Admitting: Family

## 2020-07-31 ENCOUNTER — Other Ambulatory Visit: Payer: Self-pay

## 2020-07-31 VITALS — BP 118/72 | HR 76 | Temp 98.1°F | Ht 67.25 in | Wt 180.0 lb

## 2020-07-31 DIAGNOSIS — R519 Headache, unspecified: Secondary | ICD-10-CM

## 2020-07-31 LAB — C-REACTIVE PROTEIN: CRP: <1 mg/dL (ref 0.5–20.0)

## 2020-07-31 LAB — SEDIMENTATION RATE: Sed Rate: 21 mm/hr (ref 0–30)

## 2020-07-31 NOTE — Progress Notes (Signed)
Kristie Baldwin is a 49 y.o. female with the following history as recorded in [facility]:  Patient Active Problem List   Diagnosis Date Noted  . Weight loss counseling, encounter for 12/17/2017  . Obesity 06/04/2017  . NAFLD (nonalcoholic fatty liver disease) 76/28/3151    Current Outpatient Medications  Medication Sig Dispense Refill  . Ascorbic Acid (VITAMIN C) 1000 MG tablet Take 1,000 mg by mouth daily.    Marland Kitchen BIOTIN PO Take by mouth.    . Calcium Carb-Cholecalciferol (CALCIUM 1000 + D PO) Take by mouth.    . Cholecalciferol (VITAMIN D3 PO) Take by mouth.    . COLLAGEN PO Take 1 tablet by mouth daily.    . Cyanocobalamin (B-12 PO) Take by mouth.    . estradiol (VIVELLE-DOT) 0.05 MG/24HR patch Place 1 patch (0.05 mg total) onto the skin 2 (two) times a week. 24 patch 4  . progesterone (PROMETRIUM) 100 MG capsule Take 1 capsule (100 mg total) by mouth daily. 90 capsule 4  . valACYclovir (VALTREX) 500 MG tablet Take 1 tablet (500 mg total) by mouth 2 (two) times daily. For 3 days as needed 30 tablet 2  . venlafaxine XR (EFFEXOR XR) 37.5 MG 24 hr capsule Take 1 capsule (37.5 mg total) by mouth daily with breakfast. 90 capsule 1   No current facility-administered medications for this visit.    Allergies: Monistat [miconazole]  Past Medical History:  Diagnosis Date  . Abnormal Pap smear of cervix   . Depression   . Dysmenorrhea   . Peptic ulcer   . STD (sexually transmitted disease)    HSV    History reviewed. No pertinent surgical history.  Family History  Problem Relation Age of Onset  . Hypertension Mother   . Arthritis Mother        OA  . Atrial fibrillation Mother        medication  . Heart Problems Father        pace maker 2018  . Prostate cancer Father   . Atrial fibrillation Father   . Breast cancer Neg Hx   . Colon cancer Neg Hx   . Stomach cancer Neg Hx   . Pancreatic cancer Neg Hx     Social History   Tobacco Use  . Smoking status: Former Smoker     Years: 0.00    Quit date: 08/11/1991    Years since quitting: 28.9  . Smokeless tobacco: Never Used  Substance Use Topics  . Alcohol use: Not Currently    Subjective:  Right sided jaw pain x 1 week; has already seen dentist and no dental source found; recommended follow-up with oral surgeon and scheduled to see them in January; Dentist asked that patient be screened for temporal arteritis in the interim until she can see oral surgeon; patient denies any new onset visual changes or new onset headaches;  Patient admits that she does find herself clenching her jaw occasionally/ is getting used to new contact lenses and is also working with a chiropractor who has been treating neck pain; she is considering going for a treatment today- does not want medication/ just wanted to rule out temporal arteritis as recommended by her dentist;   Objective:  Vitals:   07/31/20 0937  BP: 118/72  Pulse: 76  Temp: 98.1 F (36.7 C)  TempSrc: Oral  SpO2: 99%  Weight: 180 lb (81.6 kg)  Height: 5' 7.25" (1.708 m)    General: Well developed, well nourished, in no acute distress  Skin : Warm and dry.  Head: Normocephalic and atraumatic  Eyes: Sclera and conjunctiva clear; pupils round and reactive to light; extraocular movements intact  Ears: External normal; canals clear; tympanic membranes normal  Oropharynx: Pink, supple. No suspicious lesions  Neck: Supple without thyromegaly, adenopathy  Lungs: Respirations unlabored; clear to auscultation bilaterally without wheeze, rales, rhonchi  CVS exam: normal rate and regular rhythm.  Neurologic: Alert and oriented; speech intact; face symmetrical; moves all extremities well; CNII-XII intact without focal deficit   Assessment:  1. Facial pain     Plan:  Right sided jaw pain- most likely muscular source; keep planned follow up with oral surgeon; will check sed rate and CRP to rule out temporal arteritis but low suspicion at this time; she defers medication;  she can apply ice to jaw as needed;  This visit occurred during the SARS-CoV-2 public health emergency.  Safety protocols were in place, including screening questions prior to the visit, additional usage of staff PPE, and extensive cleaning of exam room while observing appropriate contact time as indicated for disinfecting solutions.     No follow-ups on file.  Orders Placed This Encounter  Procedures  . C-reactive protein    Standing Status:   Future    Number of Occurrences:   1    Standing Expiration Date:   07/31/2021  . Sedimentation rate    Standing Status:   Future    Number of Occurrences:   1    Standing Expiration Date:   07/31/2021    Requested Prescriptions    No prescriptions requested or ordered in this encounter

## 2020-08-06 NOTE — Telephone Encounter (Signed)
Called patient on 07-08-20 to offer earlier appointment option with Dr Edward Jolly. Patient declined and preferred to keep appointment as scheduled with Clarita Crane.  Encounter closed.

## 2020-08-20 ENCOUNTER — Encounter: Payer: Self-pay | Admitting: Family

## 2020-08-21 ENCOUNTER — Telehealth (INDEPENDENT_AMBULATORY_CARE_PROVIDER_SITE_OTHER): Payer: BC Managed Care – PPO | Admitting: Family

## 2020-08-21 ENCOUNTER — Other Ambulatory Visit: Payer: Self-pay | Admitting: Family

## 2020-08-21 DIAGNOSIS — J029 Acute pharyngitis, unspecified: Secondary | ICD-10-CM | POA: Diagnosis not present

## 2020-08-21 DIAGNOSIS — J069 Acute upper respiratory infection, unspecified: Secondary | ICD-10-CM

## 2020-08-21 LAB — POCT INFLUENZA A/B
Influenza A, POC: NEGATIVE
Influenza B, POC: NEGATIVE

## 2020-08-21 LAB — POCT RAPID STREP A (OFFICE): Rapid Strep A Screen: NEGATIVE

## 2020-08-21 NOTE — Progress Notes (Signed)
Kristie Baldwin is a 50 y.o. female with the following history as recorded in [facility]:  Patient Active Problem List   Diagnosis Date Noted  . Weight loss counseling, encounter for 12/17/2017  . Obesity 06/04/2017  . NAFLD (nonalcoholic fatty liver disease) 18/29/9371    Current Outpatient Medications  Medication Sig Dispense Refill  . Ascorbic Acid (VITAMIN C) 1000 MG tablet Take 1,000 mg by mouth daily.    Marland Kitchen BIOTIN PO Take by mouth.    . Calcium Carb-Cholecalciferol (CALCIUM 1000 + D PO) Take by mouth.    . Cholecalciferol (VITAMIN D3 PO) Take by mouth.    . COLLAGEN PO Take 1 tablet by mouth daily.    . Cyanocobalamin (B-12 PO) Take by mouth.    . estradiol (VIVELLE-DOT) 0.05 MG/24HR patch Place 1 patch (0.05 mg total) onto the skin 2 (two) times a week. 24 patch 4  . progesterone (PROMETRIUM) 100 MG capsule Take 1 capsule (100 mg total) by mouth daily. 90 capsule 4  . valACYclovir (VALTREX) 500 MG tablet Take 1 tablet (500 mg total) by mouth 2 (two) times daily. For 3 days as needed 30 tablet 2  . venlafaxine XR (EFFEXOR XR) 37.5 MG 24 hr capsule Take 1 capsule (37.5 mg total) by mouth daily with breakfast. 90 capsule 1   No current facility-administered medications for this visit.    Allergies: Monistat [miconazole]  Past Medical History:  Diagnosis Date  . Abnormal Pap smear of cervix   . Depression   . Dysmenorrhea   . Peptic ulcer   . STD (sexually transmitted disease)    HSV    No past surgical history on file.  Family History  Problem Relation Age of Onset  . Hypertension Mother   . Arthritis Mother        OA  . Atrial fibrillation Mother        medication  . Heart Problems Father        pace maker 2018  . Prostate cancer Father   . Atrial fibrillation Father   . Breast cancer Neg Hx   . Colon cancer Neg Hx   . Stomach cancer Neg Hx   . Pancreatic cancer Neg Hx     Social History   Tobacco Use  . Smoking status: Former Smoker    Years: 0.00     Quit date: 08/11/1991    Years since quitting: 29.0  . Smokeless tobacco: Never Used  Substance Use Topics  . Alcohol use: Not Currently    Subjective:    I connected with Kristie Baldwin on 08/21/20 at  9:20 AM EST by a video enabled telemedicine application and verified that I am speaking with the correct person using two identifiers.   I discussed the limitations of evaluation and management by telemedicine and the availability of in person appointments. The patient expressed understanding and agreed to proceed. Provider in office/ patient is at home; provider and patient are only 2 people on video call.   Received her COVID booster on Friday and by Saturday had developed body aches/ chills; store throat started on Sunday and notes her "throat is just on fire." Temperature is averaging about 99-100; Using Tylenol for symptom relief; unsure  if "white spots" in back of throat- " I haven't left the couch." No flu shot;     Objective:  There were no vitals filed for this visit.  General: Well developed, well nourished, in no acute distress  Head: Normocephalic and atraumatic  Lungs: Respirations unlabored;  Neurologic: Alert and oriented; speech intact; face symmetrical;   Assessment:  1. Viral URI   2. Sore throat     Plan:  ? Prolonged reaction to COVID booster or if she has flu or possible COVID infection ( that was present prior to getting booster); will check COVID test, flu swab and rapid strep; discussed Magic Mouthwash Rx but she defers for now; increase fluids, rest and follow up to be determined based on test results;  No follow-ups on file.  No orders of the defined types were placed in this encounter.   Requested Prescriptions    No prescriptions requested or ordered in this encounter

## 2020-08-21 NOTE — Progress Notes (Signed)
Pt scheduled for 1030 for covid, flu, & strep swab.  Arrival instructions given & pt verb understanding.

## 2020-08-24 DIAGNOSIS — Z1152 Encounter for screening for COVID-19: Secondary | ICD-10-CM | POA: Diagnosis not present

## 2020-08-24 LAB — SPECIMEN STATUS REPORT

## 2020-08-24 LAB — NOVEL CORONAVIRUS, NAA: SARS-CoV-2, NAA: DETECTED — AB

## 2020-08-28 DIAGNOSIS — Z1152 Encounter for screening for COVID-19: Secondary | ICD-10-CM | POA: Diagnosis not present

## 2020-09-02 DIAGNOSIS — Z20822 Contact with and (suspected) exposure to covid-19: Secondary | ICD-10-CM | POA: Diagnosis not present

## 2020-09-02 DIAGNOSIS — Z03818 Encounter for observation for suspected exposure to other biological agents ruled out: Secondary | ICD-10-CM | POA: Diagnosis not present

## 2020-09-24 ENCOUNTER — Other Ambulatory Visit: Payer: Self-pay | Admitting: Obstetrics and Gynecology

## 2020-09-24 DIAGNOSIS — Z1231 Encounter for screening mammogram for malignant neoplasm of breast: Secondary | ICD-10-CM

## 2020-09-25 DIAGNOSIS — M9903 Segmental and somatic dysfunction of lumbar region: Secondary | ICD-10-CM | POA: Diagnosis not present

## 2020-09-25 DIAGNOSIS — M9905 Segmental and somatic dysfunction of pelvic region: Secondary | ICD-10-CM | POA: Diagnosis not present

## 2020-09-25 DIAGNOSIS — M62830 Muscle spasm of back: Secondary | ICD-10-CM | POA: Diagnosis not present

## 2020-09-25 DIAGNOSIS — M9902 Segmental and somatic dysfunction of thoracic region: Secondary | ICD-10-CM | POA: Diagnosis not present

## 2020-09-30 ENCOUNTER — Encounter: Payer: Self-pay | Admitting: Obstetrics and Gynecology

## 2020-10-04 NOTE — Progress Notes (Signed)
GYNECOLOGY  VISIT  CC:  Here for menopausal sympoms  HPI: 50 y.o. G41P0010 Married White or Caucasian female here for bleeding.   Hot flashes have improved. Estrogen patch has helped a lot. Prometrium working well, feels a lot happier. Feel like in a good place, would like to continue. Just feels tired at times  Started vaginal bleeding last Monday morning-Wednesday morning and was very light, no heavy enough to use anything. It has been 1 year, 3 days since last period.  GYNECOLOGIC HISTORY: Patient's last menstrual period was 10/05/2019 (exact date). Contraception: abstinence, post menopausal Menopausal hormone therapy: prometrium, vivelle dot patch  Patient Active Problem List   Diagnosis Date Noted  . Weight loss counseling, encounter for 12/17/2017  . Obesity 06/04/2017  . NAFLD (nonalcoholic fatty liver disease) 82/95/6213    Past Medical History:  Diagnosis Date  . Abnormal Pap smear of cervix   . Depression   . Dysmenorrhea   . Peptic ulcer   . STD (sexually transmitted disease)    HSV    History reviewed. No pertinent surgical history.  MEDS:   Current Outpatient Medications on File Prior to Visit  Medication Sig Dispense Refill  . Ascorbic Acid (VITAMIN C) 1000 MG tablet Take 1,000 mg by mouth daily.    Marland Kitchen BIOTIN PO Take by mouth.    . Calcium Carb-Cholecalciferol (CALCIUM 1000 + D PO) Take by mouth.    . Cholecalciferol (VITAMIN D3 PO) Take by mouth.    . COLLAGEN PO Take 1 tablet by mouth daily.    . Cyanocobalamin (B-12 PO) Take by mouth.    . estradiol (VIVELLE-DOT) 0.05 MG/24HR patch Place 1 patch (0.05 mg total) onto the skin 2 (two) times a week. 24 patch 4  . progesterone (PROMETRIUM) 100 MG capsule Take 1 capsule (100 mg total) by mouth daily. 90 capsule 4  . valACYclovir (VALTREX) 500 MG tablet Take 1 tablet (500 mg total) by mouth 2 (two) times daily. For 3 days as needed 30 tablet 2  . venlafaxine XR (EFFEXOR XR) 37.5 MG 24 hr capsule Take 1 capsule  (37.5 mg total) by mouth daily with breakfast. 90 capsule 1   No current facility-administered medications on file prior to visit.    ALLERGIES: Monistat [miconazole]  Family History  Problem Relation Age of Onset  . Hypertension Mother   . Arthritis Mother        OA  . Atrial fibrillation Mother        medication  . Heart Problems Father        pace maker 2018  . Prostate cancer Father   . Atrial fibrillation Father   . Breast cancer Neg Hx   . Colon cancer Neg Hx   . Stomach cancer Neg Hx   . Pancreatic cancer Neg Hx      Review of Systems  Constitutional: Negative.   HENT: Negative.   Eyes: Negative.   Respiratory: Negative.   Cardiovascular: Negative.   Gastrointestinal: Negative.   Endocrine: Negative.   Genitourinary:       Vaginal bleeding  Musculoskeletal: Negative.   Skin: Negative.   Allergic/Immunologic: Negative.   Neurological: Negative.   Hematological: Negative.   Psychiatric/Behavioral: Negative.     PHYSICAL EXAMINATION:    BP 108/68   Pulse 70   Resp 16   Wt 191 lb (86.6 kg)   LMP 10/05/2019 (Exact Date) Comment: 09-30-2020 bleeding  BMI 29.69 kg/m     General appearance: alert, cooperative, no acute  distress           Assessment: Post-menopausal bleeding - Plan: US PELVIS TRANSVAGINAL NON-OB (TV ONLY)  Menopausal symptoms    Plan: Continue to Estradiol patch and Prometrium Keep bleeding diary RTC for Pelvic US to assess uterine lining

## 2020-10-07 ENCOUNTER — Ambulatory Visit: Payer: Self-pay | Admitting: Nurse Practitioner

## 2020-10-07 ENCOUNTER — Encounter: Payer: Self-pay | Admitting: Nurse Practitioner

## 2020-10-07 ENCOUNTER — Ambulatory Visit (INDEPENDENT_AMBULATORY_CARE_PROVIDER_SITE_OTHER): Payer: BC Managed Care – PPO | Admitting: Nurse Practitioner

## 2020-10-07 ENCOUNTER — Other Ambulatory Visit: Payer: Self-pay

## 2020-10-07 VITALS — BP 108/68 | HR 70 | Resp 16 | Wt 191.0 lb

## 2020-10-07 DIAGNOSIS — N950 Postmenopausal bleeding: Secondary | ICD-10-CM | POA: Diagnosis not present

## 2020-10-07 DIAGNOSIS — N951 Menopausal and female climacteric states: Secondary | ICD-10-CM

## 2020-10-07 NOTE — Patient Instructions (Signed)
Check out Audiobook: Menopause Manifesto by Dr. Theressa Stamps

## 2020-10-29 ENCOUNTER — Other Ambulatory Visit: Payer: Self-pay

## 2020-10-29 ENCOUNTER — Ambulatory Visit (INDEPENDENT_AMBULATORY_CARE_PROVIDER_SITE_OTHER): Payer: BC Managed Care – PPO

## 2020-10-29 ENCOUNTER — Encounter: Payer: Self-pay | Admitting: Obstetrics and Gynecology

## 2020-10-29 ENCOUNTER — Other Ambulatory Visit: Payer: Self-pay | Admitting: Nurse Practitioner

## 2020-10-29 ENCOUNTER — Ambulatory Visit: Payer: BC Managed Care – PPO | Admitting: Obstetrics and Gynecology

## 2020-10-29 VITALS — BP 110/74 | Temp 87.0°F | Ht 67.5 in | Wt 191.0 lb

## 2020-10-29 DIAGNOSIS — R9389 Abnormal findings on diagnostic imaging of other specified body structures: Secondary | ICD-10-CM | POA: Diagnosis not present

## 2020-10-29 DIAGNOSIS — N950 Postmenopausal bleeding: Secondary | ICD-10-CM

## 2020-10-29 NOTE — Progress Notes (Signed)
GYNECOLOGY  VISIT   HPI: 50 y.o.   Married  Caucasian  female   G1P0010 with Patient's last menstrual period was 10/05/2019 (exact date).   here for pelvic ultrasound. She did have light spotting the end of February after no menses for 1 year. No pain.   Patient is on HRT.  No missed doses of her estrogen or progesterone.   No hot flashes.   GYNECOLOGIC HISTORY: Patient's last menstrual period was 10/05/2019 (exact date). Contraception:  PMP Menopausal hormone therapy: Prometrium, Vivelle Dot Last mammogram: 10-20-19 3D/Neg/BiRads1 Last pap smear:   09-26-15 Neg:Neg HR HPV, 2014 Neg        OB History    Gravida  1   Para  0   Term  0   Preterm  0   AB  1   Living  0     SAB  0   IAB  1   Ectopic  0   Multiple  0   Live Births  0              Patient Active Problem List   Diagnosis Date Noted  . Weight loss counseling, encounter for 12/17/2017  . Obesity 06/04/2017  . NAFLD (nonalcoholic fatty liver disease) 60/63/0160    Past Medical History:  Diagnosis Date  . Abnormal Pap smear of cervix   . Depression   . Dysmenorrhea   . Peptic ulcer   . STD (sexually transmitted disease)    HSV    History reviewed. No pertinent surgical history.  Current Outpatient Medications  Medication Sig Dispense Refill  . Ascorbic Acid (VITAMIN C) 1000 MG tablet Take 1,000 mg by mouth daily.    Marland Kitchen BIOTIN PO Take by mouth.    . Calcium Carb-Cholecalciferol (CALCIUM 1000 + D PO) Take by mouth.    . Cholecalciferol (VITAMIN D3 PO) Take by mouth.    . COLLAGEN PO Take 1 tablet by mouth daily.    . Cyanocobalamin (B-12 PO) Take by mouth.    . estradiol (VIVELLE-DOT) 0.05 MG/24HR patch Place 1 patch (0.05 mg total) onto the skin 2 (two) times a week. 24 patch 4  . progesterone (PROMETRIUM) 100 MG capsule Take 1 capsule (100 mg total) by mouth daily. 90 capsule 4  . valACYclovir (VALTREX) 500 MG tablet Take 1 tablet (500 mg total) by mouth 2 (two) times daily. For 3 days  as needed 30 tablet 2  . venlafaxine XR (EFFEXOR XR) 37.5 MG 24 hr capsule Take 1 capsule (37.5 mg total) by mouth daily with breakfast. 90 capsule 1   No current facility-administered medications for this visit.     ALLERGIES: Monistat [miconazole]  Family History  Problem Relation Age of Onset  . Hypertension Mother   . Arthritis Mother        OA  . Atrial fibrillation Mother        medication  . Heart Problems Father        pace maker 2018  . Prostate cancer Father   . Atrial fibrillation Father   . Breast cancer Neg Hx   . Colon cancer Neg Hx   . Stomach cancer Neg Hx   . Pancreatic cancer Neg Hx     Social History   Socioeconomic History  . Marital status: Married    Spouse name: Not on file  . Number of children: 0  . Years of education: Not on file  . Highest education level: Not on file  Occupational History  Employer: [employer]  Tobacco Use  . Smoking status: Former Smoker    Years: 0.00    Quit date: 08/11/1991    Years since quitting: 29.2  . Smokeless tobacco: Never Used  Vaping Use  . Vaping Use: Never used  Substance and Sexual Activity  . Alcohol use: Yes    Alcohol/week: 2.0 standard drinks    Types: 2 Standard drinks or equivalent per week  . Drug use: No  . Sexual activity: Not Currently    Partners: Male    Birth control/protection: Post-menopausal  Other Topics Concern  . Not on file  Social History Narrative  . Not on file   Social Determinants of Health   Financial Resource Strain: Not on file  Food Insecurity: Not on file  Transportation Needs: Not on file  Physical Activity: Not on file  Stress: Not on file  Social Connections: Not on file  Intimate Partner Violence: Not on file    Review of Systems  All other systems reviewed and are negative.   PHYSICAL EXAMINATION:    BP 110/74 (Cuff Size: Large)   Temp (!) 87 F (30.6 C)   Ht 5' 7.5" (1.715 m)   Wt 191 lb (86.6 kg)   LMP 10/05/2019 (Exact Date) Comment:  09-30-2020 bleeding  SpO2 98%   BMI 29.47 kg/m     General appearance: alert, cooperative and appears stated age   Pelvic US - transvaginal Uterus no myometrial masses. EMS 5.70 mm. No masses. Ovaries:  Right ovary normal and left ovary not seen.  No adnexal masses. No free fluid.  ASSESSMENT  Postmenopausal bleeding.  HRT. Thickened endometrium.  PLAN  Pelvic ultrasound images and findings reviewed with patient.  I recommend returning for a pap and endometrial biopsy.  Rationale explained - check for polyps, infection, abnormal uterine cells.    20 min  total time was spent for this patient encounter, including preparation, face-to-face counseling with the patient, coordination of care, and documentation of the encounter.

## 2020-10-29 NOTE — Patient Instructions (Signed)
Endometrial Biopsy  An endometrial biopsy is a procedure to remove tissue samples from the endometrium, which is the lining of the uterus. The tissue that is removed can then be checked under a microscope for disease. This procedure is used to diagnose conditions such as endometrial cancer, endometrial tuberculosis, polyps, or other inflammatory conditions. This procedure may also be used to investigate uterine bleeding to determine where you are in your menstrual cycle or how your hormone levels are affecting the lining of the uterus. Tell a health care provider about:  Any allergies you have.  All medicines you are taking, including vitamins, herbs, eye drops, creams, and over-the-counter medicines.  Any problems you or family members have had with anesthetic medicines.  Any blood disorders you have.  Any surgeries you have had.  Any medical conditions you have.  Whether you are pregnant or may be pregnant. What are the risks? Generally, this is a safe procedure. However, problems may occur, including:  Bleeding.  Pelvic infection.  Puncture of the wall of the uterus with the biopsy device (rare).  Allergic reactions to medicines. What happens before the procedure?  Keep a record of your menstrual cycles as told by your health care provider. You may need to schedule your procedure for a specific time in your cycle.  You may want to bring a sanitary pad to wear after the procedure.  Plan to have someone take you home from the hospital or clinic.  Ask your health care provider about: ? Changing or stopping your regular medicines. This is especially important if you are taking diabetes medicines, arthritis medicines, or blood thinners. ? Taking medicines such as aspirin and ibuprofen. These medicines can thin your blood. Do not take these medicines unless your health care provider tells you to take them. ? Taking over-the-counter medicines, vitamins, herbs, and  supplements. What happens during the procedure?  You will lie on an exam table with your feet and legs supported as in a pelvic exam.  Your health care provider will insert an instrument (speculum) into your vagina to see your cervix.  Your cervix will be cleansed with an antiseptic solution.  A medicine (local anesthetic) will be used to numb the cervix.  A forceps instrument (tenaculum) will be used to hold your cervix steady for the biopsy.  A thin, rod-like instrument (uterine sound) will be inserted through your cervix to determine the length of your uterus and the location where the biopsy sample will be removed.  A thin, flexible tube (catheter) will be inserted through your cervix and into the uterus. The catheter will be used to collect the biopsy sample from your endometrial tissue.  The catheter and speculum will then be removed, and the tissue sample will be sent to a lab for examination. The procedure may vary among health care providers and hospitals. What can I expect after procedure?  You will rest in a recovery area until you are ready to go home.  You may have mild cramping and a small amount of vaginal bleeding. This is normal.  You may have a small amount of vaginal bleeding for a few days. This is normal.  It is up to you to get the results of your procedure. Ask your health care provider, or the department that is doing the procedure, when your results will be ready. Follow these instructions at home:  Take over-the-counter and prescription medicines only as told by your health care provider.  Do not douche, use tampons, or have   sexual intercourse until your health care provider approves.  Return to your normal activities as told by your health care provider. Ask your health care provider what activities are safe for you.  Follow instructions from your health care provider about any activity restrictions, such as restrictions on strenuous exercise or heavy  lifting.  Keep all follow-up visits. This is important. Contact a health care provider:  You have heavy bleeding, or bleed for longer than 2 days after the procedure.  You have bad smelling discharge from your vagina.  You have a fever or chills.  You have a burning sensation when urinating or you have difficulty urinating.  You have severe pain in your lower abdomen. Get help right away if you:  You have severe cramps in your stomach or back.  You pass large blood clots.  Your bleeding increases.  You become weak or light-headed, or you faint or lose consciousness. Summary  An endometrial biopsy is a procedure to remove tissue samples is taken from the endometrium, which is the lining of the uterus.  The tissue sample that is removed will be checked under a microscope for disease.  This procedure is used to diagnose conditions such as endometrial cancer, endometrial tuberculosis, polyps, or other inflammatory conditions.  After the procedure, it is common to have mild cramping and a small amount of vaginal bleeding for a few days.  Do not douche, use tampons, or have sexual intercourse until your health care provider approves. Ask your health care provider which activities are safe for you. This information is not intended to replace advice given to you by your health care provider. Make sure you discuss any questions you have with your health care provider. Document Revised: 02/19/2020 Document Reviewed: 02/19/2020 Elsevier Patient Education  2021 Elsevier Inc.  

## 2020-11-07 DIAGNOSIS — M62830 Muscle spasm of back: Secondary | ICD-10-CM | POA: Diagnosis not present

## 2020-11-07 DIAGNOSIS — M9905 Segmental and somatic dysfunction of pelvic region: Secondary | ICD-10-CM | POA: Diagnosis not present

## 2020-11-07 DIAGNOSIS — M9902 Segmental and somatic dysfunction of thoracic region: Secondary | ICD-10-CM | POA: Diagnosis not present

## 2020-11-07 DIAGNOSIS — M9903 Segmental and somatic dysfunction of lumbar region: Secondary | ICD-10-CM | POA: Diagnosis not present

## 2020-11-12 ENCOUNTER — Ambulatory Visit: Payer: BC Managed Care – PPO | Admitting: Obstetrics and Gynecology

## 2020-11-12 ENCOUNTER — Encounter: Payer: Self-pay | Admitting: Obstetrics and Gynecology

## 2020-11-12 ENCOUNTER — Other Ambulatory Visit (HOSPITAL_COMMUNITY)
Admission: RE | Admit: 2020-11-12 | Discharge: 2020-11-12 | Disposition: A | Discharge status: Home / Self Care | Payer: BC Managed Care – PPO | Source: Ambulatory Visit | Attending: Obstetrics and Gynecology | Admitting: Obstetrics and Gynecology

## 2020-11-12 ENCOUNTER — Other Ambulatory Visit: Payer: Self-pay

## 2020-11-12 VITALS — BP 108/70 | HR 82 | Ht 67.5 in | Wt 191.0 lb

## 2020-11-12 DIAGNOSIS — Z124 Encounter for screening for malignant neoplasm of cervix: Secondary | ICD-10-CM

## 2020-11-12 DIAGNOSIS — R9389 Abnormal findings on diagnostic imaging of other specified body structures: Secondary | ICD-10-CM | POA: Insufficient documentation

## 2020-11-12 DIAGNOSIS — N950 Postmenopausal bleeding: Secondary | ICD-10-CM | POA: Insufficient documentation

## 2020-11-12 NOTE — Progress Notes (Signed)
GYNECOLOGY  VISIT   HPI: 50 y.o.   Married  Caucasian  female   G1P0010 with Patient's last menstrual period was 10/05/2019 (exact date).   here for endometrial biopsy for postmenopausal bleeding.  Also due for pap today. No further bleeding.   Pelvic US showing EMS 5.7 mm, otherwise unremarkable findings.   GYNECOLOGIC HISTORY: Patient's last menstrual period was 10/05/2019 (exact date). Contraception:  PMP Menopausal hormone therapy:  Prometrium, Vivelle Dot Last mammogram: 10-20-19 3D/Neg/BiRads1 Last pap smear: 09-26-15 Neg:Neg HR HPV, 2014 Neg        OB History    Gravida  1   Para  0   Term  0   Preterm  0   AB  1   Living  0     SAB  0   IAB  1   Ectopic  0   Multiple  0   Live Births  0              Patient Active Problem List   Diagnosis Date Noted  . Weight loss counseling, encounter for 12/17/2017  . Obesity 06/04/2017  . NAFLD (nonalcoholic fatty liver disease) 16/05/9603    Past Medical History:  Diagnosis Date  . Abnormal Pap smear of cervix   . Depression   . Dysmenorrhea   . Peptic ulcer   . STD (sexually transmitted disease)    HSV    History reviewed. No pertinent surgical history.  Current Outpatient Medications  Medication Sig Dispense Refill  . Ascorbic Acid (VITAMIN C) 1000 MG tablet Take 1,000 mg by mouth daily.    Marland Kitchen BIOTIN PO Take by mouth.    . Calcium Carb-Cholecalciferol (CALCIUM 1000 + D PO) Take by mouth.    . Cholecalciferol (VITAMIN D3 PO) Take by mouth.    . COLLAGEN PO Take 1 tablet by mouth daily.    . Cyanocobalamin (B-12 PO) Take by mouth.    . estradiol (VIVELLE-DOT) 0.05 MG/24HR patch Place 1 patch (0.05 mg total) onto the skin 2 (two) times a week. 24 patch 4  . progesterone (PROMETRIUM) 100 MG capsule Take 1 capsule (100 mg total) by mouth daily. 90 capsule 4  . valACYclovir (VALTREX) 500 MG tablet Take 1 tablet (500 mg total) by mouth 2 (two) times daily. For 3 days as needed 30 tablet 2  . venlafaxine  XR (EFFEXOR XR) 37.5 MG 24 hr capsule Take 1 capsule (37.5 mg total) by mouth daily with breakfast. 90 capsule 1   No current facility-administered medications for this visit.     ALLERGIES: Monistat [miconazole]  Family History  Problem Relation Age of Onset  . Hypertension Mother   . Arthritis Mother        OA  . Atrial fibrillation Mother        medication  . Heart Problems Father        pace maker 2018  . Prostate cancer Father   . Atrial fibrillation Father   . Breast cancer Neg Hx   . Colon cancer Neg Hx   . Stomach cancer Neg Hx   . Pancreatic cancer Neg Hx     Social History   Socioeconomic History  . Marital status: Married    Spouse name: Not on file  . Number of children: 0  . Years of education: Not on file  . Highest education level: Not on file  Occupational History    Employer: [employer]  Tobacco Use  . Smoking status: Former Smoker  Years: 0.00    Quit date: 08/11/1991    Years since quitting: 29.2  . Smokeless tobacco: Never Used  Vaping Use  . Vaping Use: Never used  Substance and Sexual Activity  . Alcohol use: Yes    Alcohol/week: 2.0 standard drinks    Types: 2 Standard drinks or equivalent per week  . Drug use: No  . Sexual activity: Not Currently    Partners: Male    Birth control/protection: Post-menopausal  Other Topics Concern  . Not on file  Social History Narrative  . Not on file   Social Determinants of Health   Financial Resource Strain: Not on file  Food Insecurity: Not on file  Transportation Needs: Not on file  Physical Activity: Not on file  Stress: Not on file  Social Connections: Not on file  Intimate Partner Violence: Not on file    Review of Systems  All other systems reviewed and are negative.   PHYSICAL EXAMINATION:    BP 108/70 (Cuff Size: Large)   Pulse 82   Ht 5' 7.5" (1.715 m)   Wt 191 lb (86.6 kg)   LMP 10/05/2019 (Exact Date) Comment: 09-30-2020 bleeding  SpO2 100%   BMI 29.47 kg/m      General appearance: alert, cooperative and appears stated age   EMB: Consent for procedure.  Pap collected.  Hibiclens prep. Paracervical biopsy with 10 cc 1% lidocaine, lot [id number], exp 10/2023. Tenaculum to anterior cervical lip.  Os finder use.  Pipelle passed to 8 cm x 2.  Tissue to pathology.  Silver nitrate used on injection sites.  Minimal EBL.  No complications.   Chaperone was present for exam.  ASSESSMENT  Postmenopausal bleeding.  Thickened endometrium.  PLAN  Pap and HR HPV collected.  Fu EMB.  Post biopsy precautions given.

## 2020-11-12 NOTE — Patient Instructions (Signed)

## 2020-11-13 LAB — CYTOLOGY - PAP
Comment: NEGATIVE
Diagnosis: NEGATIVE
High risk HPV: NEGATIVE

## 2020-11-14 LAB — SURGICAL PATHOLOGY

## 2020-11-18 ENCOUNTER — Encounter: Payer: Self-pay | Admitting: Obstetrics and Gynecology

## 2020-11-19 ENCOUNTER — Other Ambulatory Visit: Payer: Self-pay

## 2020-11-19 ENCOUNTER — Ambulatory Visit
Admission: RE | Admit: 2020-11-19 | Discharge: 2020-11-19 | Disposition: A | Discharge status: Home / Self Care | Payer: BC Managed Care – PPO | Source: Ambulatory Visit | Attending: Obstetrics and Gynecology | Admitting: Obstetrics and Gynecology

## 2020-11-19 DIAGNOSIS — Z1231 Encounter for screening mammogram for malignant neoplasm of breast: Secondary | ICD-10-CM | POA: Diagnosis not present

## 2020-11-20 DIAGNOSIS — M9905 Segmental and somatic dysfunction of pelvic region: Secondary | ICD-10-CM | POA: Diagnosis not present

## 2020-11-20 DIAGNOSIS — M9902 Segmental and somatic dysfunction of thoracic region: Secondary | ICD-10-CM | POA: Diagnosis not present

## 2020-11-20 DIAGNOSIS — M62830 Muscle spasm of back: Secondary | ICD-10-CM | POA: Diagnosis not present

## 2020-11-20 DIAGNOSIS — M9903 Segmental and somatic dysfunction of lumbar region: Secondary | ICD-10-CM | POA: Diagnosis not present

## 2020-11-25 ENCOUNTER — Ambulatory Visit: Payer: BC Managed Care – PPO | Admitting: Obstetrics and Gynecology

## 2020-11-28 DIAGNOSIS — M9903 Segmental and somatic dysfunction of lumbar region: Secondary | ICD-10-CM | POA: Diagnosis not present

## 2020-11-28 DIAGNOSIS — M9905 Segmental and somatic dysfunction of pelvic region: Secondary | ICD-10-CM | POA: Diagnosis not present

## 2020-11-28 DIAGNOSIS — M9902 Segmental and somatic dysfunction of thoracic region: Secondary | ICD-10-CM | POA: Diagnosis not present

## 2020-11-28 DIAGNOSIS — M62830 Muscle spasm of back: Secondary | ICD-10-CM | POA: Diagnosis not present

## 2020-12-04 DIAGNOSIS — M9903 Segmental and somatic dysfunction of lumbar region: Secondary | ICD-10-CM | POA: Diagnosis not present

## 2020-12-04 DIAGNOSIS — M9905 Segmental and somatic dysfunction of pelvic region: Secondary | ICD-10-CM | POA: Diagnosis not present

## 2020-12-04 DIAGNOSIS — M9902 Segmental and somatic dysfunction of thoracic region: Secondary | ICD-10-CM | POA: Diagnosis not present

## 2020-12-04 DIAGNOSIS — M62830 Muscle spasm of back: Secondary | ICD-10-CM | POA: Diagnosis not present

## 2020-12-19 DIAGNOSIS — M9905 Segmental and somatic dysfunction of pelvic region: Secondary | ICD-10-CM | POA: Diagnosis not present

## 2020-12-19 DIAGNOSIS — M62830 Muscle spasm of back: Secondary | ICD-10-CM | POA: Diagnosis not present

## 2020-12-19 DIAGNOSIS — M9902 Segmental and somatic dysfunction of thoracic region: Secondary | ICD-10-CM | POA: Diagnosis not present

## 2020-12-19 DIAGNOSIS — M9903 Segmental and somatic dysfunction of lumbar region: Secondary | ICD-10-CM | POA: Diagnosis not present

## 2021-01-07 ENCOUNTER — Encounter: Payer: Self-pay | Admitting: Obstetrics and Gynecology

## 2021-01-08 ENCOUNTER — Other Ambulatory Visit: Payer: Self-pay

## 2021-01-08 ENCOUNTER — Ambulatory Visit: Payer: BC Managed Care – PPO | Admitting: Obstetrics and Gynecology

## 2021-01-08 ENCOUNTER — Encounter: Payer: Self-pay | Admitting: Obstetrics and Gynecology

## 2021-01-08 VITALS — BP 120/82 | HR 83 | Ht 67.5 in | Wt 191.0 lb

## 2021-01-08 DIAGNOSIS — N950 Postmenopausal bleeding: Secondary | ICD-10-CM | POA: Diagnosis not present

## 2021-01-08 DIAGNOSIS — Z79890 Hormone replacement therapy (postmenopausal): Secondary | ICD-10-CM | POA: Diagnosis not present

## 2021-01-08 MED ORDER — PROGESTERONE 200 MG PO CAPS: 200.0000 mg | ORAL_CAPSULE | Freq: Every day | ORAL | 1 refills | Status: DC

## 2021-01-08 MED ORDER — IBUPROFEN 800 MG PO TABS: 800.0000 mg | ORAL_TABLET | Freq: Three times a day (TID) | ORAL | 1 refills | Status: DC | PRN

## 2021-01-08 NOTE — Progress Notes (Signed)
GYNECOLOGY  VISIT   HPI: 50 y.o.   Married  Caucasian  female   G1P0010 with Patient's last menstrual period was 10/05/2019 (exact date).   here for postmenopausal bleeding. This began on Monday, two days ago, rather heavy red blood. Now more brown old blood. Patient feels tired and crampy. She feels like she is going to start her period.   Patient has had postmenopausal bleeding in February after going doing for 1 year no menstrual cycle. She is on HRT.  No missed or late doses.  Pelvic US 10/29/20 showed EMS 5.7 mm, normal right ovary and left ovary not seen. EMB 11/12/20 showed scanty inactive endometrium and scant benign endocervical tissue.  FSH 181.6 on 06/17/18.   No change in partner.   GYNECOLOGIC HISTORY: Patient's last menstrual period was 10/05/2019 (exact date). Contraception:  PMP Menopausal hormone therapy:  Prometrium, Vivelle Dot Last mammogram: 11-19-20 3D/Neg/BiRads1 Last pap smear: 11-12-20 Neg:Neg HR HPV, 09-26-15 Neg:Neg HR HPV, 2014 Neg                OB History    Gravida  1   Para  0   Term  0   Preterm  0   AB  1   Living  0     SAB  0   IAB  1   Ectopic  0   Multiple  0   Live Births  0              Patient Active Problem List   Diagnosis Date Noted  . Weight loss counseling, encounter for 12/17/2017  . Obesity 06/04/2017  . NAFLD (nonalcoholic fatty liver disease) 87/86/7672    Past Medical History:  Diagnosis Date  . Abnormal Pap smear of cervix   . Depression   . Dysmenorrhea   . Peptic ulcer   . STD (sexually transmitted disease)    HSV    History reviewed. No pertinent surgical history.  Current Outpatient Medications  Medication Sig Dispense Refill  . Ascorbic Acid (VITAMIN C) 1000 MG tablet Take 1,000 mg by mouth daily.    Marland Kitchen BIOTIN PO Take by mouth.    . Calcium Carb-Cholecalciferol (CALCIUM 1000 + D PO) Take by mouth.    . Cholecalciferol (VITAMIN D3 PO) Take by mouth.    . COLLAGEN PO Take 1 tablet by mouth  daily.    . Cyanocobalamin (B-12 PO) Take by mouth.    . estradiol (VIVELLE-DOT) 0.05 MG/24HR patch Place 1 patch (0.05 mg total) onto the skin 2 (two) times a week. 24 patch 4  . progesterone (PROMETRIUM) 100 MG capsule Take 1 capsule (100 mg total) by mouth daily. 90 capsule 4  . valACYclovir (VALTREX) 500 MG tablet Take 1 tablet (500 mg total) by mouth 2 (two) times daily. For 3 days as needed 30 tablet 2  . venlafaxine XR (EFFEXOR XR) 37.5 MG 24 hr capsule Take 1 capsule (37.5 mg total) by mouth daily with breakfast. 90 capsule 1   No current facility-administered medications for this visit.     ALLERGIES: Monistat [miconazole]  Family History  Problem Relation Age of Onset  . Hypertension Mother   . Arthritis Mother        OA  . Atrial fibrillation Mother        medication  . Heart Problems Father        pace maker 2018  . Prostate cancer Father   . Atrial fibrillation Father   . Breast cancer  Neg Hx   . Colon cancer Neg Hx   . Stomach cancer Neg Hx   . Pancreatic cancer Neg Hx     Social History   Socioeconomic History  . Marital status: Married    Spouse name: Not on file  . Number of children: 0  . Years of education: Not on file  . Highest education level: Not on file  Occupational History    Employer: [employer]  Tobacco Use  . Smoking status: Former Smoker    Years: 0.00    Quit date: 08/11/1991    Years since quitting: 29.4  . Smokeless tobacco: Never Used  Vaping Use  . Vaping Use: Never used  Substance and Sexual Activity  . Alcohol use: Yes    Alcohol/week: 2.0 standard drinks    Types: 2 Standard drinks or equivalent per week  . Drug use: No  . Sexual activity: Not Currently    Partners: Male    Birth control/protection: Post-menopausal  Other Topics Concern  . Not on file  Social History Narrative  . Not on file   Social Determinants of Health   Financial Resource Strain: Not on file  Food Insecurity: Not on file  Transportation Needs:  Not on file  Physical Activity: Not on file  Stress: Not on file  Social Connections: Not on file  Intimate Partner Violence: Not on file    Review of Systems  All other systems reviewed and are negative.   PHYSICAL EXAMINATION:    BP 120/82 (Cuff Size: Large)   Pulse 83   Ht 5' 7.5" (1.715 m)   Wt 191 lb (86.6 kg)   LMP 10/05/2019 (Exact Date) Comment: 09-30-2020 bleeding  SpO2 98%   BMI 29.47 kg/m     General appearance: alert, cooperative and appears stated age  Pelvic: External genitalia:  no lesions              Urethra:  normal appearing urethra with no masses, tenderness or lesions              Bartholins and Skenes: normal                 Vagina: normal appearing vagina with normal color and discharge, no lesions              Cervix: no lesions.  Old blood in vagina.                  Bimanual Exam:  Uterus:  normal size, contour, position, consistency, mobility, non-tender              Adnexa: no mass, fullness, tenderness    Chaperone was present for exam.  ASSESSMENT  Recurrent postmenopausal bleeding on HRT. Negative work up.   PLAN  Check FSH.  Increase Prometrium to 200 mg nightly.  Motrin 800 mg po q 8 hours prn.  If bleeding persists or recurs, will do hysteroscopy and dilation and curettage.  Patient is in agreement with the plan. FU in 4 weeks, sooner as needed.   22 min  total time was spent for this patient encounter, including preparation, face-to-face counseling with the patient, coordination of care, and documentation of the encounter.

## 2021-01-09 LAB — FOLLICLE STIMULATING HORMONE: FSH: 61.9 m[IU]/mL

## 2021-01-15 DIAGNOSIS — M62830 Muscle spasm of back: Secondary | ICD-10-CM | POA: Diagnosis not present

## 2021-01-15 DIAGNOSIS — M9905 Segmental and somatic dysfunction of pelvic region: Secondary | ICD-10-CM | POA: Diagnosis not present

## 2021-01-15 DIAGNOSIS — M9902 Segmental and somatic dysfunction of thoracic region: Secondary | ICD-10-CM | POA: Diagnosis not present

## 2021-01-15 DIAGNOSIS — M9903 Segmental and somatic dysfunction of lumbar region: Secondary | ICD-10-CM | POA: Diagnosis not present

## 2021-01-15 DIAGNOSIS — M791 Myalgia, unspecified site: Secondary | ICD-10-CM | POA: Diagnosis not present

## 2021-02-07 ENCOUNTER — Encounter: Payer: Self-pay | Admitting: Obstetrics and Gynecology

## 2021-02-07 ENCOUNTER — Ambulatory Visit: Payer: BC Managed Care – PPO | Admitting: Obstetrics and Gynecology

## 2021-02-07 ENCOUNTER — Other Ambulatory Visit: Payer: Self-pay

## 2021-02-07 VITALS — BP 120/80

## 2021-02-07 DIAGNOSIS — Z79890 Hormone replacement therapy (postmenopausal): Secondary | ICD-10-CM

## 2021-02-07 DIAGNOSIS — Z8742 Personal history of other diseases of the female genital tract: Secondary | ICD-10-CM | POA: Diagnosis not present

## 2021-02-07 NOTE — Progress Notes (Signed)
GYNECOLOGY  VISIT   HPI: 50 y.o.   Married  Caucasian  female   [id number] with Patient's last menstrual period was 10/05/2019 (exact date).   here for  follow up PMB   Patient has had postmenopausal bleeding on HRT in February after going doing for 1 year no menstrual cycle. She is on HRT.  No missed or late doses. Pelvic US 10/29/20 showed EMS 5.7 mm, normal right ovary and left ovary not seen. EMB 11/12/20 showed scanty inactive endometrium and scant benign endocervical tissue  She bled again end of May, beginning of June.  FSH 61.9 on 01/08/21. I increased her Prometrium to 200 mg nightly.  No more bleeding or spotting.  Feels relaxed at night.  Increased hunger.  No hot flashes on her current regimen.  She wants to continue her HRT.   Really busy schedule and not as much time to exercise.   GYNECOLOGIC HISTORY: Patient's last menstrual period was 10/05/2019 (exact date). Contraception:  none Menopausal hormone therapy:  yes Last mammogram:  11-19-20, Bi-RADS1 Last pap smear:   11-12-2020 - Negative, Neg HR HPV.        OB History     Gravida  1   Para  0   Term  0   Preterm  0   AB  1   Living  0      SAB  0   IAB  1   Ectopic  0   Multiple  0   Live Births  0              Patient Active Problem List   Diagnosis Date Noted   Weight loss counseling, encounter for 12/17/2017   Obesity 06/04/2017   NAFLD (nonalcoholic fatty liver disease) 90/30/0923    Past Medical History:  Diagnosis Date   Abnormal Pap smear of cervix    Depression    Dysmenorrhea    Peptic ulcer    STD (sexually transmitted disease)    HSV    No past surgical history on file.  Current Outpatient Medications  Medication Sig Dispense Refill   Ascorbic Acid (VITAMIN C) 1000 MG tablet Take 1,000 mg by mouth daily.     BIOTIN PO Take by mouth.     Calcium Carb-Cholecalciferol (CALCIUM 1000 + D PO) Take by mouth.     Cholecalciferol (VITAMIN D3 PO) Take by mouth.     COLLAGEN  PO Take 1 tablet by mouth daily.     Cyanocobalamin (B-12 PO) Take by mouth.     estradiol (VIVELLE-DOT) 0.05 MG/24HR patch Place 1 patch (0.05 mg total) onto the skin 2 (two) times a week. 24 patch 4   ibuprofen (ADVIL) 800 MG tablet Take 1 tablet (800 mg total) by mouth every 8 (eight) hours as needed. 30 tablet 1   progesterone (PROMETRIUM) 200 MG capsule Take 1 capsule (200 mg total) by mouth daily. 90 capsule 1   valACYclovir (VALTREX) 500 MG tablet Take 1 tablet (500 mg total) by mouth 2 (two) times daily. For 3 days as needed 30 tablet 2   venlafaxine XR (EFFEXOR XR) 37.5 MG 24 hr capsule Take 1 capsule (37.5 mg total) by mouth daily with breakfast. 90 capsule 1   No current facility-administered medications for this visit.     ALLERGIES: Monistat [miconazole]  Family History  Problem Relation Age of Onset   Hypertension Mother    Arthritis Mother        OA   Atrial fibrillation  Mother        medication   Heart Problems Father        pace maker 2018   Prostate cancer Father    Atrial fibrillation Father    Breast cancer Neg Hx    Colon cancer Neg Hx    Stomach cancer Neg Hx    Pancreatic cancer Neg Hx     Social History   Socioeconomic History   Marital status: Married    Spouse name: Not on file   Number of children: 0   Years of education: Not on file   Highest education level: Not on file  Occupational History    Employer: [employer]  Tobacco Use   Smoking status: Former    Years: 0.00    Pack years: 0.00    Types: Cigarettes    Quit date: 08/11/1991    Years since quitting: 29.5   Smokeless tobacco: Never  Vaping Use   Vaping Use: Never used  Substance and Sexual Activity   Alcohol use: Yes    Alcohol/week: 2.0 standard drinks    Types: 2 Standard drinks or equivalent per week   Drug use: No   Sexual activity: Not Currently    Partners: Male    Birth control/protection: Post-menopausal  Other Topics Concern   Not on file  Social History  Narrative   Not on file   Social Determinants of Health   Financial Resource Strain: Not on file  Food Insecurity: Not on file  Transportation Needs: Not on file  Physical Activity: Not on file  Stress: Not on file  Social Connections: Not on file  Intimate Partner Violence: Not on file    Review of Systems  See HPI.  PHYSICAL EXAMINATION:    BP 120/80 (BP Location: Left Arm, Patient Position: Sitting, Cuff Size: Normal)   LMP 10/05/2019 (Exact Date) Comment: 09-30-2020 bleeding    General appearance: alert, cooperative and appears stated age  ASSESSMENT  History of postmenopausal bleeding on HRT, resolved.  Negative work up.   PLAN  Negative work up reviewed and reassurance given to the patient.  Ok to continue Vivelle Dot 0.05 mg twice weekly and Prometrium 200 mg q hs.  We talked about potential side effects of increasing the Prometrium - sleepiness, bloating, change in appetite.  She will contact me for refills if needed prior to her annual exam.  20 min  total time was spent for this patient encounter, including preparation, face-to-face counseling with the patient, coordination of care, and documentation of the encounter.

## 2021-02-19 DIAGNOSIS — M9902 Segmental and somatic dysfunction of thoracic region: Secondary | ICD-10-CM | POA: Diagnosis not present

## 2021-02-19 DIAGNOSIS — M9905 Segmental and somatic dysfunction of pelvic region: Secondary | ICD-10-CM | POA: Diagnosis not present

## 2021-02-19 DIAGNOSIS — M791 Myalgia, unspecified site: Secondary | ICD-10-CM | POA: Diagnosis not present

## 2021-02-19 DIAGNOSIS — M9903 Segmental and somatic dysfunction of lumbar region: Secondary | ICD-10-CM | POA: Diagnosis not present

## 2021-02-19 DIAGNOSIS — M62830 Muscle spasm of back: Secondary | ICD-10-CM | POA: Diagnosis not present

## 2021-04-18 DIAGNOSIS — M9903 Segmental and somatic dysfunction of lumbar region: Secondary | ICD-10-CM | POA: Diagnosis not present

## 2021-04-18 DIAGNOSIS — M62830 Muscle spasm of back: Secondary | ICD-10-CM | POA: Diagnosis not present

## 2021-04-18 DIAGNOSIS — M791 Myalgia, unspecified site: Secondary | ICD-10-CM | POA: Diagnosis not present

## 2021-04-18 DIAGNOSIS — M9902 Segmental and somatic dysfunction of thoracic region: Secondary | ICD-10-CM | POA: Diagnosis not present

## 2021-04-18 DIAGNOSIS — M9905 Segmental and somatic dysfunction of pelvic region: Secondary | ICD-10-CM | POA: Diagnosis not present

## 2021-04-22 ENCOUNTER — Encounter: Payer: Self-pay | Admitting: Obstetrics and Gynecology

## 2021-04-28 IMAGING — MG MM DIGITAL SCREENING BILAT W/ TOMO AND CAD
8 series · 8 of 24 positions shown · non-contrast
Comparison: Previous exam(s).

CLINICAL DATA: Screening.

EXAM:
DIGITAL SCREENING BILATERAL MAMMOGRAM WITH TOMOSYNTHESIS AND CAD
TECHNIQUE: Bilateral screening digital craniocaudal and mediolateral oblique
mammograms were obtained. Bilateral screening digital breast
tomosynthesis was performed. The images were evaluated with
computer-aided detection.

[R CC synth-2D]
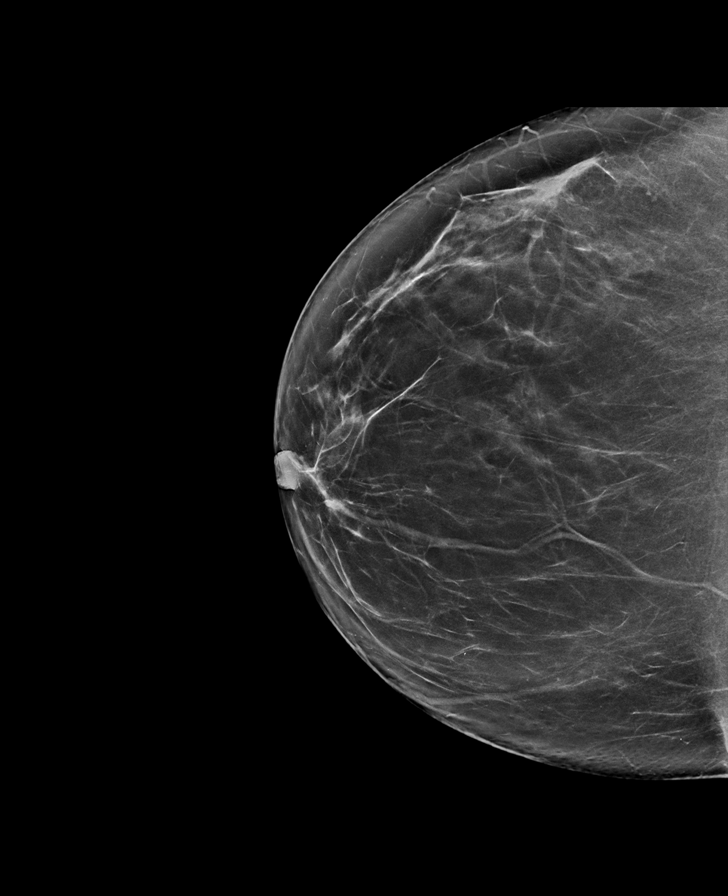

[L CC synth-2D]
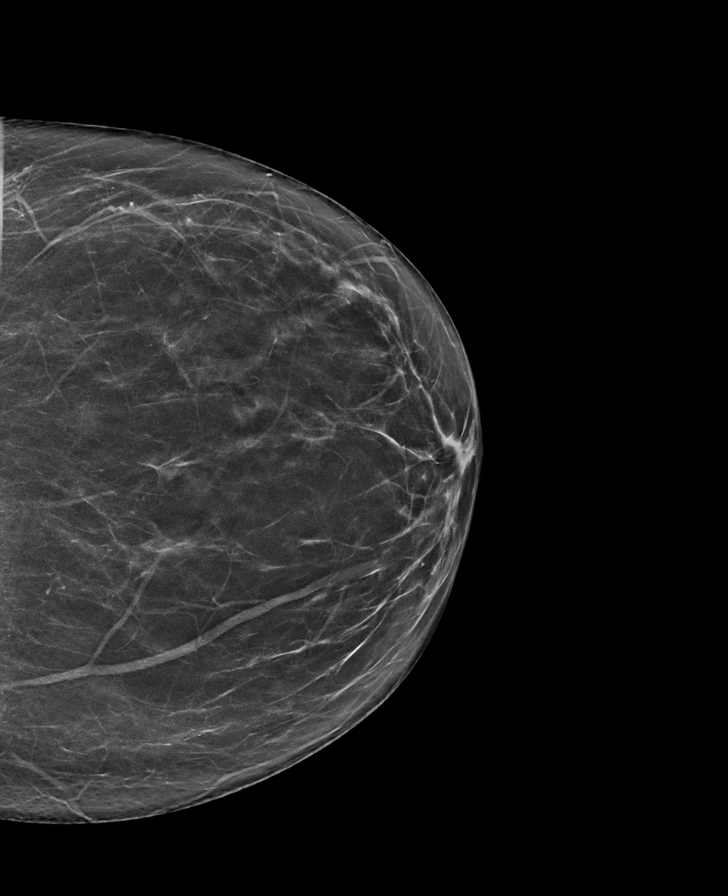

[L MLO synth-2D]
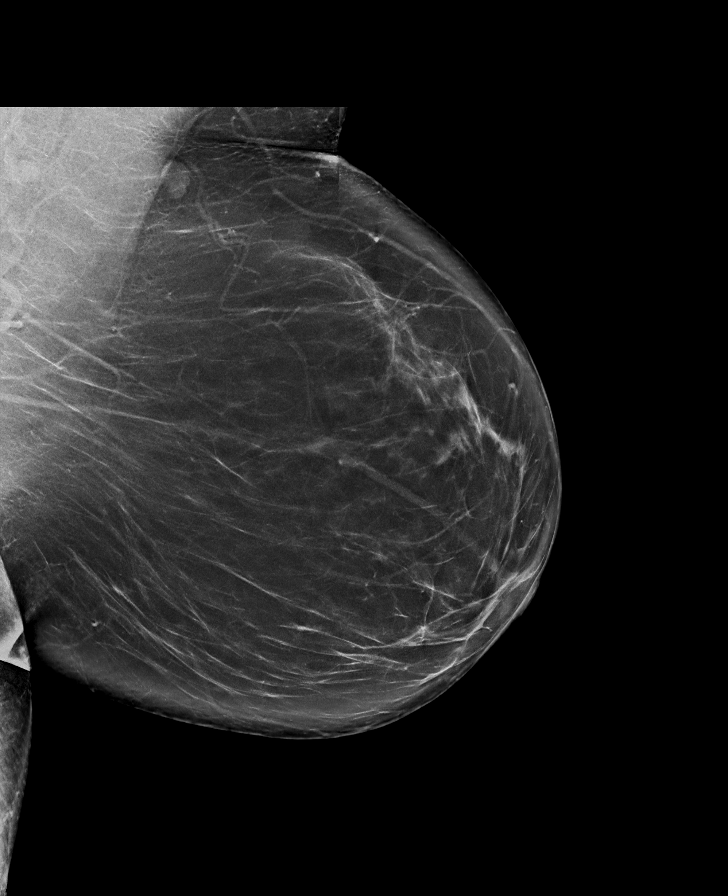

[R MLO synth-2D]
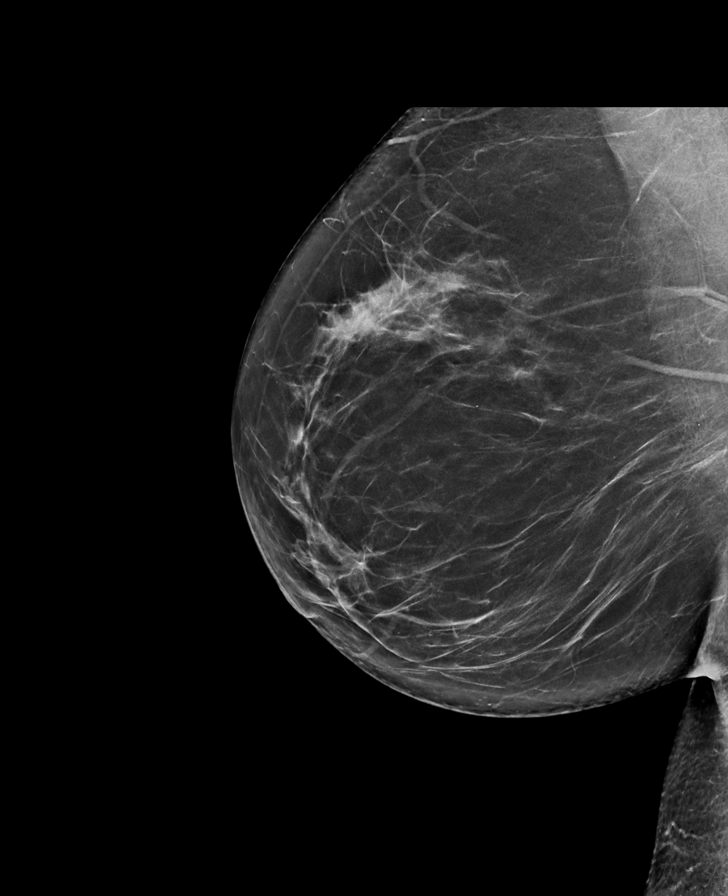

[L CC tomo · tomo slice 34/67.0]
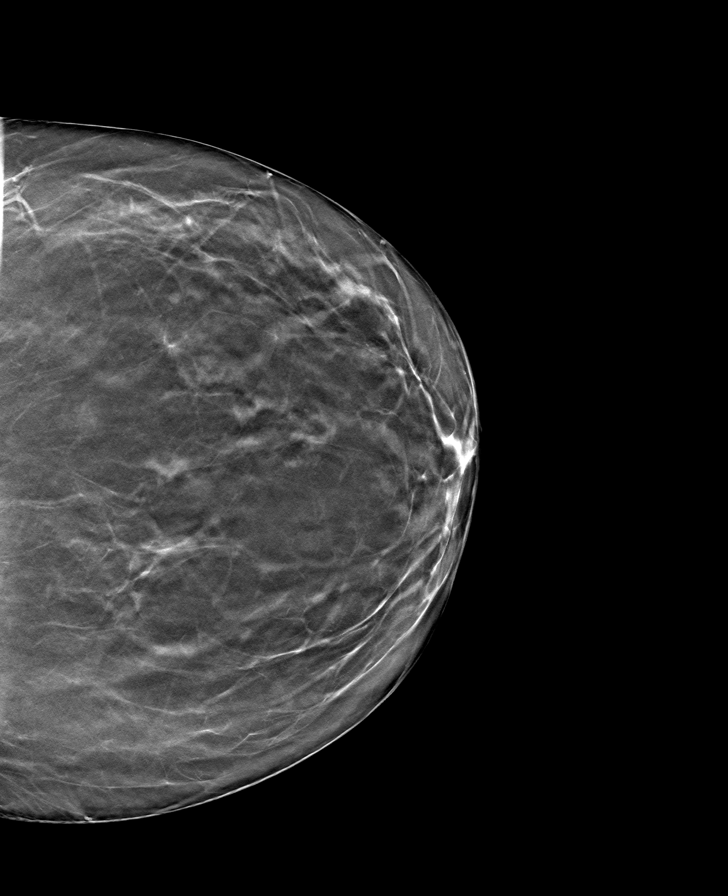

[L MLO tomo · tomo slice 47/93.0]
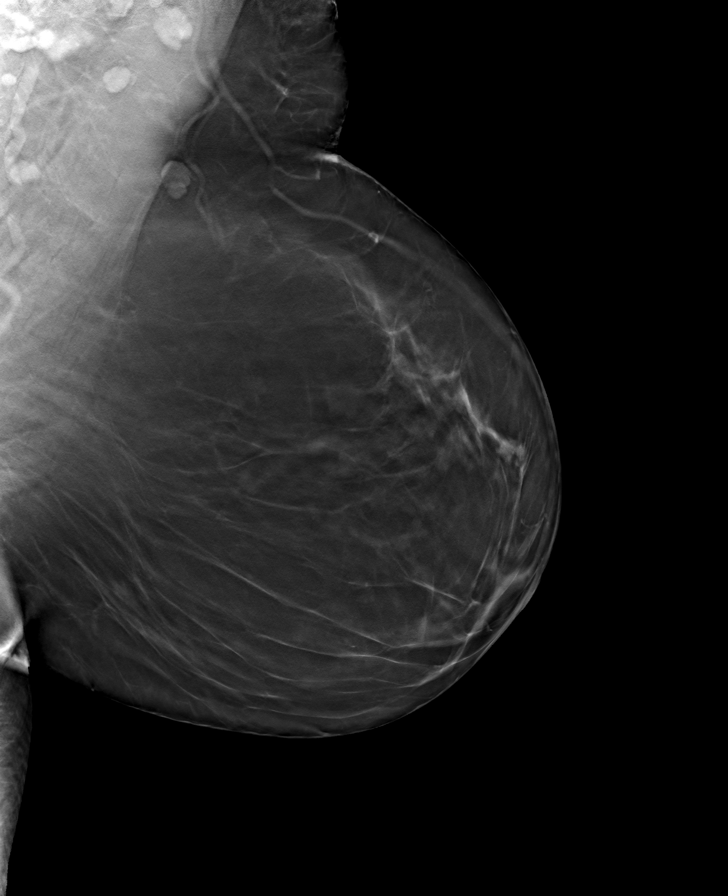

[R CC tomo · tomo slice 39/77.0]
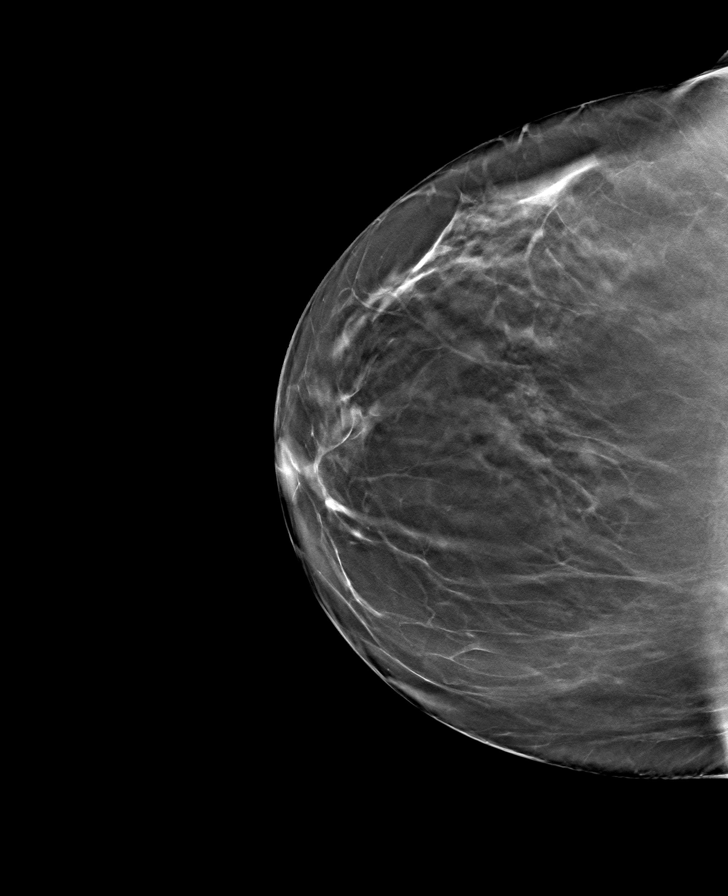

[R MLO tomo · tomo slice 43/84.0]
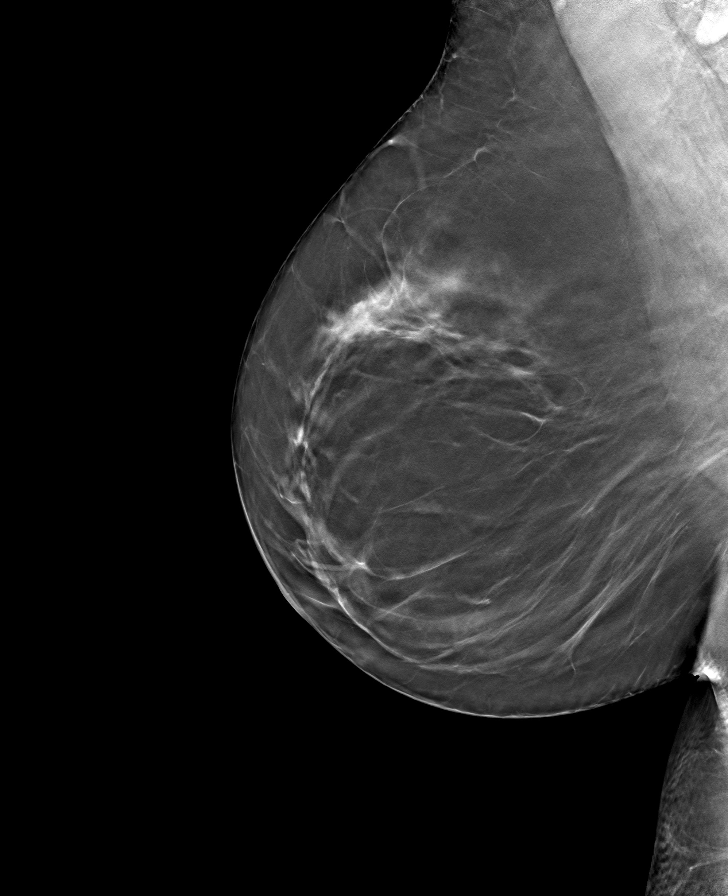

[8 of 24 positions shown; findings below may reference images not displayed]

ACR Breast Density Category b: There are scattered areas of
fibroglandular density.
FINDINGS: There are no findings suspicious for malignancy. The images were
evaluated with computer-aided detection.
IMPRESSION: No mammographic evidence of malignancy. A result letter of this
screening mammogram will be mailed directly to the patient.

RECOMMENDATION:
Screening mammogram in one year. (Code:[id number])

BI-RADS CATEGORY  1: Negative.

## 2021-04-30 ENCOUNTER — Telehealth: Payer: Self-pay

## 2021-04-30 ENCOUNTER — Telehealth: Payer: Self-pay | Admitting: Family

## 2021-04-30 NOTE — Telephone Encounter (Signed)
FYI Spoke with nurse in regards to patient and she stated that patient refused to go ED as she felt that patient needed to go get her heart checked out.  She has an appt on 05/02/21 but she needs to be evaluated before then and not wait til then.  I have tried several times to contact patient and phone goes straight to voicemail.  I did leave a message to call us back as soon as possible.  Left message on husband cell as well to ask patient to call us back.    I will try later again to call back.

## 2021-04-30 NOTE — Telephone Encounter (Signed)
Patient had called back but no one clinical took call. Called back again and left another message.

## 2021-04-30 NOTE — Telephone Encounter (Signed)
Pt called stating she has been having SOB this week when walking short distances.  She has been feeling fatigued, feels sleepy all the time and her acid reflux is constant.  I did make her an appointment for Friday with PCP, but went ahead and transferred her to Triage for the concern of SOB for further evaluation.

## 2021-04-30 NOTE — Telephone Encounter (Signed)
FYI  Patient called back.  She stated that she is feeling a little bit better now and that she only gets short of breath when up and walking..  She is at work and has taken something for her acid reflux and her husband is getting her some gatorade.  She stated that she has had a history of ulcers in the past and that her acid reflux is out of control.  Advised again about going to ER and she stated as she told the nurse that she will go if she gets worse.  Advised that symptoms or heart attack can differ in women than in men, so we have to be careful.  Also advised again that if symptoms get worse to go to ER or at least urgent care to be evaluated immediately.

## 2021-04-30 NOTE — Telephone Encounter (Signed)
Contact Type Call Who Is Calling Patient / Member / Family / Caregiver Call Type Triage / Clinical Relationship To Patient Self Return Phone Number [phone] (Primary) Chief Complaint BREATHING - shortness of breath or sounds breathless Reason for Call Symptomatic / Request for Health Information Initial Comment Caller states she is experiencing shortness of breath with acid reflux. Translation No Nurse Assessment Nurse: Mayford Knife, RN, Whitney Date/Time (Eastern Time): 04/30/2021 10:23:20 AM Confirm and document reason for call. If symptomatic, describe symptoms. ---Caller states she is experiencing shortness of breath with ambulation and acid reflux. States she is nauseated at night and very tired at the end of the day, symptoms started on Monday. Does the patient have any new or worsening symptoms? ---Yes Will a triage be completed? ---Yes Related visit to physician within the last 2 weeks? ---No Does the PT have any chronic conditions? (i.e. diabetes, asthma, this includes High risk factors for pregnancy, etc.) ---No Is the patient pregnant or possibly pregnant? (Ask all females between the ages of 69-55) ---No Is this a behavioral health or substance abuse call? ---No Guidelines Guideline Title Affirmed Question Affirmed Notes Nurse Date/Time (Eastern Time) Breathing Difficulty [1] MODERATE difficulty breathing (e.g., speaks in phrases, SOB even at rest, pulse 100-120) AND [2] NEW-onset or WORSE than normal Mayford Knife, Charity fundraiser, Whitney 04/30/2021 10:25:20 AM PLEASE NOTE: All timestamps contained within this report are represented as [place] Standard Time. CONFIDENTIALTY NOTICE: This fax transmission is intended only for the addressee. It contains information that is legally privileged, confidential or otherwise protected from use or disclosure. If you are not the intended recipient, you are strictly prohibited from reviewing, disclosing, copying using or disseminating  any of this information or taking any action in reliance on or regarding this information. If you have received this fax in error, please notify us immediately by telephone so that we can arrange for its return to [place]. Phone: [phone], Toll-Free: [phone], Fax: [phone] Page: 2 of 2 Call Id: [id number] Disp. Time Lamount Cohen Time) Disposition Final User 04/30/2021 10:22:11 AM Send to Urgent Queue Richrd Humbles 04/30/2021 10:31:05 AM Go to ED Now Yes Mayford Knife, RN, Whitney Caller Disagree/Comply Disagree Caller Understands Yes PreDisposition InappropriateToAsk Care Advice Given Per Guideline GO TO ED NOW: * You need to be seen in the Emergency Department. Comments User: Hardie Lora, RN Date/Time Lamount Cohen Time): 04/30/2021 10:34:12 AM RN spoke with Shaquita in the office with the backline, instructed of triage outcome, symptoms and refusal to go at this time. States they will give her a call. Referrals GO TO FACILITY REFUSED

## 2021-04-30 NOTE — Telephone Encounter (Signed)
Patient called and stated Apolonio Schneiders tried contacting her and was unable to reach her due to phone issues. Patient husband notified wife and she returned the call. Please call patient back at [phone]. Please advise.

## 2021-04-30 NOTE — Telephone Encounter (Signed)
See other phone note

## 2021-05-02 ENCOUNTER — Ambulatory Visit: Payer: BC Managed Care – PPO | Admitting: Family

## 2021-05-02 ENCOUNTER — Other Ambulatory Visit: Payer: Self-pay

## 2021-05-02 ENCOUNTER — Encounter: Payer: Self-pay | Admitting: Family

## 2021-05-02 ENCOUNTER — Ambulatory Visit (INDEPENDENT_AMBULATORY_CARE_PROVIDER_SITE_OTHER)
Admission: RE | Admit: 2021-05-02 | Discharge: 2021-05-02 | Disposition: A | Discharge status: Home / Self Care | Payer: BC Managed Care – PPO | Source: Ambulatory Visit | Attending: Family | Admitting: Family

## 2021-05-02 VITALS — BP 110/70 | HR 73 | Temp 98.1°F | Ht 68.0 in | Wt 207.4 lb

## 2021-05-02 DIAGNOSIS — R0602 Shortness of breath: Secondary | ICD-10-CM

## 2021-05-02 DIAGNOSIS — R5383 Other fatigue: Secondary | ICD-10-CM

## 2021-05-02 DIAGNOSIS — R7309 Other abnormal glucose: Secondary | ICD-10-CM | POA: Diagnosis not present

## 2021-05-02 DIAGNOSIS — K219 Gastro-esophageal reflux disease without esophagitis: Secondary | ICD-10-CM | POA: Diagnosis not present

## 2021-05-02 MED ORDER — PANTOPRAZOLE SODIUM 40 MG PO TBEC: 40.0000 mg | DELAYED_RELEASE_TABLET | Freq: Two times a day (BID) | ORAL | 3 refills | Status: DC

## 2021-05-02 NOTE — Progress Notes (Signed)
Kristie Baldwin is a 50 y.o. female with the following history as recorded in Epic[facility]:  Patient Active Problem List   Diagnosis Date Noted   Weight loss counseling, encounter for 12/17/2017   Obesity 06/04/2017   NAFLD (nonalcoholic fatty liver disease) 12/15/2016    Current Outpatient Medications  Medication Sig Dispense Refill   Ascorbic Acid (VITAMIN C) 1000 MG tablet Take 1,000 mg by mouth daily.     BIOTIN PO Take by mouth.     Calcium Carb-Cholecalciferol (CALCIUM 1000 + D PO) Take by mouth.     Cholecalciferol (VITAMIN D3 PO) Take by mouth.     COLLAGEN PO Take 1 tablet by mouth daily.     Cyanocobalamin (B-12 PO) Take by mouth.     estradiol (VIVELLE-DOT) 0.05 MG/24HR patch Place 1 patch (0.05 mg total) onto the skin 2 (two) times a week. 24 patch 4   ibuprofen (ADVIL) 800 MG tablet Take 1 tablet (800 mg total) by mouth every 8 (eight) hours as needed. 30 tablet 1   pantoprazole (PROTONIX) 40 MG tablet Take 1 tablet (40 mg total) by mouth 2 (two) times daily before a meal. 60 tablet 3   progesterone (PROMETRIUM) 200 MG capsule Take 1 capsule (200 mg total) by mouth daily. 90 capsule 1   valACYclovir (VALTREX) 500 MG tablet Take 1 tablet (500 mg total) by mouth 2 (two) times daily. For 3 days as needed 30 tablet 2   venlafaxine XR (EFFEXOR XR) 37.5 MG 24 hr capsule Take 1 capsule (37.5 mg total) by mouth daily with breakfast. 90 capsule 1   No current facility-administered medications for this visit.    Allergies: Monistat [miconazole]  Past Medical History:  Diagnosis Date   Abnormal Pap smear of cervix    Depression    Dysmenorrhea    Peptic ulcer    STD (sexually transmitted disease)    HSV    No past surgical history on file.  Family History  Problem Relation Age of Onset   Hypertension Mother    Arthritis Mother        OA   Atrial fibrillation Mother        medication   Heart Problems Father        pace maker 2018   Prostate cancer Father    Atrial  fibrillation Father    Breast cancer Neg Hx    Colon cancer Neg Hx    Stomach cancer Neg Hx    Pancreatic cancer Neg Hx     Social History   Tobacco Use   Smoking status: Former    Years: 0.00    Types: Cigarettes    Quit date: 08/11/1991    Years since quitting: 29.7   Smokeless tobacco: Never  Substance Use Topics   Alcohol use: Yes    Alcohol/week: 2.0 standard drinks    Types: 2 Standard drinks or equivalent per week    Subjective:  Complaining of feeling SOB with walking x 1 week; increase burping and fatigue; history of GERD- notes that has history of GERD and re-started Omeprazole earlier this week; helping slightly but still burping all day long;     Objective:  Vitals:   05/02/21 1341  BP: 110/70  Pulse: 73  Temp: 98.1 F (36.7 C)  TempSrc: Oral  SpO2: 99%  Weight: 207 lb 6.4 oz (94.1 kg)  Height: _0  (1.727 m)    General: Well developed, well nourished, in no acute distress  Skin : Warm  and dry.  Head: Normocephalic and atraumatic  Eyes: Sclera and conjunctiva clear; pupils round and reactive to light; extraocular movements intact  Ears: External normal; canals clear; tympanic membranes normal  Oropharynx: Pink, supple. No suspicious lesions  Neck: Supple without thyromegaly, adenopathy  Lungs: Respirations unlabored; clear to auscultation bilaterally without wheeze, rales, rhonchi  CVS exam: normal rate and regular rhythm.  Abdomen: Soft; nontender; nondistended; normoactive bowel sounds; no masses or hepatosplenomegaly  Musculoskeletal: No deformities; no active joint inflammation  Extremities: No edema, cyanosis, clubbing  Vessels: Symmetric bilaterally  Neurologic: Alert and oriented; speech intact; face symmetrical; moves all extremities well; CNII-XII intact without focal deficit   Assessment:  1. SOB (shortness of breath)   2. Other fatigue   3. Gastroesophageal reflux disease, unspecified whether esophagitis present   4. Elevated glucose      Plan:  ? Related to uncontrolled GERD; EKG shows sinus rhythm; update CXR and labs today; trial of Protonix 40 mg bid; to consider referral to GI for possible endoscopy;  ? Hormone related- she is planning to discuss with GYN; 4.   Check hgba1c; to consider trial of Saxenda or Wegovy;   Follow up to be determined;  This visit occurred during the SARS-CoV-2 public health emergency.  Safety protocols were in place, including screening questions prior to the visit, additional usage of staff PPE, and extensive cleaning of exam room while observing appropriate contact time as indicated for disinfecting solutions.    No follow-ups on file.  Orders Placed This Encounter  Procedures   DG Chest 2 View    Standing Status:   Future    Number of Occurrences:   1    Standing Expiration Date:   05/02/2022    Order Specific Question:   Reason for Exam (SYMPTOM  OR DIAGNOSIS REQUIRED)    Answer:   shortness of breath    Order Specific Question:   Is patient pregnant?    Answer:   No    Order Specific Question:   Preferred imaging location?    Answer:   [address]   Hemoglobin A1c   CBC with Differential/Platelet   Comp Met (CMET)   TSH   EKG 12-Lead    Requested Prescriptions   Signed Prescriptions Disp Refills   pantoprazole (PROTONIX) 40 MG tablet 60 tablet 3    Sig: Take 1 tablet (40 mg total) by mouth 2 (two) times daily before a meal.

## 2021-05-03 LAB — CBC WITH DIFFERENTIAL/PLATELET
Absolute Monocytes: 703 cells/uL (ref 200–950)
Basophils Absolute: 62 cells/uL (ref 0–200)
Basophils Relative: 0.7 %
Eosinophils Absolute: 178 cells/uL (ref 15–500)
Eosinophils Relative: 2 %
HCT: 41.6 % (ref 35.0–45.0)
Hemoglobin: 13.4 g/dL (ref 11.7–15.5)
Lymphs Abs: 1994 cells/uL (ref 850–3900)
MCH: 27.4 pg (ref 27.0–33.0)
MCHC: 32.2 g/dL (ref 32.0–36.0)
MCV: 85.1 fL (ref 80.0–100.0)
MPV: 10.4 fL (ref 7.5–12.5)
Monocytes Relative: 7.9 %
Neutro Abs: 5963 cells/uL (ref 1500–7800)
Neutrophils Relative %: 67 %
Platelets: 328 10*3/uL (ref 140–400)
RBC: 4.89 10*6/uL (ref 3.80–5.10)
RDW: 12.3 % (ref 11.0–15.0)
Total Lymphocyte: 22.4 %
WBC: 8.9 10*3/uL (ref 3.8–10.8)

## 2021-05-03 LAB — COMPREHENSIVE METABOLIC PANEL
AG Ratio: 1.6 (calc) (ref 1.0–2.5)
ALT: 18 U/L (ref 6–29)
AST: 23 U/L (ref 10–35)
Albumin: 4.5 g/dL (ref 3.6–5.1)
Alkaline phosphatase (APISO): 65 U/L (ref 37–153)
BUN/Creatinine Ratio: 9 (calc) (ref 6–22)
BUN: 10 mg/dL (ref 7–25)
CO2: 27 mmol/L (ref 20–32)
Calcium: 9.6 mg/dL (ref 8.6–10.4)
Chloride: 104 mmol/L (ref 98–110)
Creat: 1.08 mg/dL — ABNORMAL HIGH (ref 0.50–1.03)
Globulin: 2.9 g/dL (calc) (ref 1.9–3.7)
Glucose, Bld: 90 mg/dL (ref 65–99)
Potassium: 4.6 mmol/L (ref 3.5–5.3)
Sodium: 139 mmol/L (ref 135–146)
Total Bilirubin: 0.4 mg/dL (ref 0.2–1.2)
Total Protein: 7.4 g/dL (ref 6.1–8.1)

## 2021-05-03 LAB — TSH: TSH: 1.5 mIU/L

## 2021-05-03 LAB — HEMOGLOBIN A1C
Hgb A1c MFr Bld: 5.1 % of total Hgb (ref <5.7)
Mean Plasma Glucose: 100 mg/dL
eAG (mmol/L): 5.5 mmol/L

## 2021-05-06 ENCOUNTER — Other Ambulatory Visit: Payer: Self-pay | Admitting: Family

## 2021-05-06 MED ORDER — AZITHROMYCIN 250 MG PO TABS: ORAL_TABLET | ORAL | 0 refills | Status: DC

## 2021-05-06 MED ORDER — ALBUTEROL SULFATE HFA 108 (90 BASE) MCG/ACT IN AERS: 2.0000 | INHALATION_SPRAY | Freq: Four times a day (QID) | RESPIRATORY_TRACT | 0 refills | Status: DC | PRN

## 2021-05-19 DIAGNOSIS — M62830 Muscle spasm of back: Secondary | ICD-10-CM | POA: Diagnosis not present

## 2021-05-19 DIAGNOSIS — M62838 Other muscle spasm: Secondary | ICD-10-CM | POA: Diagnosis not present

## 2021-05-19 DIAGNOSIS — M9905 Segmental and somatic dysfunction of pelvic region: Secondary | ICD-10-CM | POA: Diagnosis not present

## 2021-05-19 DIAGNOSIS — M9903 Segmental and somatic dysfunction of lumbar region: Secondary | ICD-10-CM | POA: Diagnosis not present

## 2021-05-19 DIAGNOSIS — M9902 Segmental and somatic dysfunction of thoracic region: Secondary | ICD-10-CM | POA: Diagnosis not present

## 2021-05-26 DIAGNOSIS — M62830 Muscle spasm of back: Secondary | ICD-10-CM | POA: Diagnosis not present

## 2021-05-26 DIAGNOSIS — M62838 Other muscle spasm: Secondary | ICD-10-CM | POA: Diagnosis not present

## 2021-05-26 DIAGNOSIS — M9902 Segmental and somatic dysfunction of thoracic region: Secondary | ICD-10-CM | POA: Diagnosis not present

## 2021-05-26 DIAGNOSIS — M9905 Segmental and somatic dysfunction of pelvic region: Secondary | ICD-10-CM | POA: Diagnosis not present

## 2021-05-26 DIAGNOSIS — M9903 Segmental and somatic dysfunction of lumbar region: Secondary | ICD-10-CM | POA: Diagnosis not present

## 2021-05-27 ENCOUNTER — Encounter: Payer: Self-pay | Admitting: Family

## 2021-05-27 ENCOUNTER — Other Ambulatory Visit: Payer: Self-pay

## 2021-05-27 ENCOUNTER — Ambulatory Visit (INDEPENDENT_AMBULATORY_CARE_PROVIDER_SITE_OTHER)
Admission: RE | Admit: 2021-05-27 | Discharge: 2021-05-27 | Disposition: A | Discharge status: Home / Self Care | Payer: BC Managed Care – PPO | Source: Ambulatory Visit | Attending: Family | Admitting: Family

## 2021-05-27 ENCOUNTER — Ambulatory Visit: Payer: BC Managed Care – PPO | Admitting: Family

## 2021-05-27 ENCOUNTER — Other Ambulatory Visit: Payer: Self-pay | Admitting: Family

## 2021-05-27 VITALS — BP 124/80 | HR 94 | Temp 97.8°F | Ht 68.0 in | Wt 210.4 lb

## 2021-05-27 DIAGNOSIS — R0602 Shortness of breath: Secondary | ICD-10-CM | POA: Diagnosis not present

## 2021-05-27 MED ORDER — FLUTICASONE PROPIONATE HFA 110 MCG/ACT IN AERO: 2.0000 | INHALATION_SPRAY | Freq: Two times a day (BID) | RESPIRATORY_TRACT | 0 refills | Status: DC

## 2021-05-27 MED ORDER — FLUTICASONE FUROATE-VILANTEROL 200-25 MCG/INH IN AEPB: 1.0000 | INHALATION_SPRAY | Freq: Every day | RESPIRATORY_TRACT | 0 refills | Status: DC

## 2021-05-27 NOTE — Progress Notes (Signed)
Kristie Baldwin is a 50 y.o. female with the following history as recorded in Epic[facility]:  Patient Active Problem List   Diagnosis Date Noted   Weight loss counseling, encounter for 12/17/2017   Obesity 06/04/2017   NAFLD (nonalcoholic fatty liver disease) 40/98/1191    Current Outpatient Medications  Medication Sig Dispense Refill   albuterol (VENTOLIN HFA) 108 (90 Base) MCG/ACT inhaler INHALE 2 PUFFS INTO THE LUNGS EVERY 6 HOURS AS NEEDED FOR WHEEZING OR SHORTNESS OF BREATH 18 g 1   Ascorbic Acid (VITAMIN C) 1000 MG tablet Take 1,000 mg by mouth daily.     BIOTIN PO Take by mouth.     Calcium Carb-Cholecalciferol (CALCIUM 1000 + D PO) Take by mouth.     Cholecalciferol (VITAMIN D3 PO) Take by mouth.     COLLAGEN PO Take 1 tablet by mouth daily.     Cyanocobalamin (B-12 PO) Take by mouth.     estradiol (VIVELLE-DOT) 0.05 MG/24HR patch Place 1 patch (0.05 mg total) onto the skin 2 (two) times a week. 24 patch 4   fluticasone furoate-vilanterol (BREO ELLIPTA) 200-25 MCG/INH AEPB Inhale 1 puff into the lungs daily. 1 each 0   ibuprofen (ADVIL) 800 MG tablet Take 1 tablet (800 mg total) by mouth every 8 (eight) hours as needed. 30 tablet 1   pantoprazole (PROTONIX) 40 MG tablet Take 1 tablet (40 mg total) by mouth 2 (two) times daily before a meal. 60 tablet 3   progesterone (PROMETRIUM) 200 MG capsule Take 1 capsule (200 mg total) by mouth daily. 90 capsule 1   valACYclovir (VALTREX) 500 MG tablet Take 1 tablet (500 mg total) by mouth 2 (two) times daily. For 3 days as needed 30 tablet 2   venlafaxine XR (EFFEXOR XR) 37.5 MG 24 hr capsule Take 1 capsule (37.5 mg total) by mouth daily with breakfast. 90 capsule 1   No current facility-administered medications for this visit.    Allergies: Monistat [miconazole]  Past Medical History:  Diagnosis Date   Abnormal Pap smear of cervix    Depression    Dysmenorrhea    Peptic ulcer    STD (sexually transmitted disease)    HSV    No past  surgical history on file.  Family History  Problem Relation Age of Onset   Hypertension Mother    Arthritis Mother        OA   Atrial fibrillation Mother        medication   Heart Problems Father        pace maker 2018   Prostate cancer Father    Atrial fibrillation Father    Breast cancer Neg Hx    Colon cancer Neg Hx    Stomach cancer Neg Hx    Pancreatic cancer Neg Hx     Social History   Tobacco Use   Smoking status: Former    Years: 0.00    Types: Cigarettes    Quit date: 08/11/1991    Years since quitting: 29.8   Smokeless tobacco: Never  Substance Use Topics   Alcohol use: Yes    Alcohol/week: 2.0 standard drinks    Types: 2 Standard drinks or equivalent per week    Subjective:  3 week follow up on GERD/ sensation of SOB; CXR done at last OV showed changes concerning for bronchitis or asthma; she was treated with Z-pak and albuterol; notes that her GERD is much improved since starting the Protonix but the shortness of breath is persisting;  No chest pain on exertion or dizziness or nausea; does feel SOB with exertion; no fever or cough; "just feel really tired."     Objective:  Vitals:   05/27/21 0841  BP: 124/80  Pulse: 94  Temp: 97.8 F (36.6 C)  TempSrc: Oral  SpO2: 99%  Weight: 210 lb 6.4 oz (95.4 kg)  Height: 5\' 8"  (1.727 m)    General: Well developed, well nourished, in no acute distress  Skin : Warm and dry.  Head: Normocephalic and atraumatic  Lungs: Respirations unlabored; clear to auscultation bilaterally without wheeze, rales, rhonchi  CVS exam: normal rate and regular rhythm.  Vessels: Symmetric bilaterally  Neurologic: Alert and oriented; speech intact; face symmetrical; moves all extremities well; CNII-XII intact without focal deficit   Assessment:  1. Shortness of breath     Plan:  ? Etiology; repeat CXR shows sinus rhythm- ? Pulmonary source; repeat CXR; update d-dimer today; trial of BREO- ? Secondary asthma type symptoms from  uncontrolled GERD- refer to pulmonologist and cardiologist;  Follow up to be determined;   This visit occurred during the SARS-CoV-2 public health emergency.  Safety protocols were in place, including screening questions prior to the visit, additional usage of staff PPE, and extensive cleaning of exam room while observing appropriate contact time as indicated for disinfecting solutions.    No follow-ups on file.  Orders Placed This Encounter  Procedures   DG Chest 2 View    Standing Status:   Future    Standing Expiration Date:   05/27/2022    Order Specific Question:   Reason for Exam (SYMPTOM  OR DIAGNOSIS REQUIRED)    Answer:   sob/ follow up abnormal CXR    Order Specific Question:   Is patient pregnant?    Answer:   No    Order Specific Question:   Preferred imaging location?    Answer:   05/29/2022   D-Dimer, Quantitative   Ambulatory referral to Cardiology    Referral Priority:   Routine    Referral Type:   Consultation    Referral Reason:   Specialty Services Required    Requested Specialty:   Cardiology    Number of Visits Requested:   1   Ambulatory referral to Pulmonology    Referral Priority:   Routine    Referral Type:   Consultation    Referral Reason:   Specialty Services Required    Requested Specialty:   Pulmonary Disease    Number of Visits Requested:   1   EKG 12-Lead    Requested Prescriptions   Signed Prescriptions Disp Refills   fluticasone furoate-vilanterol (BREO ELLIPTA) 200-25 MCG/INH AEPB 1 each 0    Sig: Inhale 1 puff into the lungs daily.

## 2021-05-28 LAB — D-DIMER, QUANTITATIVE: D-Dimer, Quant: 0.28 mcg/mL FEU (ref <0.50)

## 2021-05-30 ENCOUNTER — Other Ambulatory Visit: Payer: Self-pay | Admitting: *Deleted

## 2021-05-30 DIAGNOSIS — N951 Menopausal and female climacteric states: Secondary | ICD-10-CM

## 2021-05-30 MED ORDER — VENLAFAXINE HCL ER 37.5 MG PO CP24: 37.5000 mg | ORAL_CAPSULE | Freq: Every day | ORAL | 0 refills | Status: DC

## 2021-05-30 NOTE — Telephone Encounter (Signed)
Med refill request: venlafaxine ER 37.5 mg caps, 1 cap PO daily Last AEX: 07/09/20/ KD Next AEX: 08/20/21/ BS Last MMG (if hormonal med) N/A Refill authorized: Please Advise?  RX pended for 90 day supply/ 0RF  Routing to Dr. Edward Jolly

## 2021-06-06 DIAGNOSIS — M7541 Impingement syndrome of right shoulder: Secondary | ICD-10-CM | POA: Diagnosis not present

## 2021-06-06 DIAGNOSIS — M9901 Segmental and somatic dysfunction of cervical region: Secondary | ICD-10-CM | POA: Diagnosis not present

## 2021-06-06 DIAGNOSIS — M9907 Segmental and somatic dysfunction of upper extremity: Secondary | ICD-10-CM | POA: Diagnosis not present

## 2021-06-06 DIAGNOSIS — M9902 Segmental and somatic dysfunction of thoracic region: Secondary | ICD-10-CM | POA: Diagnosis not present

## 2021-06-10 DIAGNOSIS — M9902 Segmental and somatic dysfunction of thoracic region: Secondary | ICD-10-CM | POA: Diagnosis not present

## 2021-06-10 DIAGNOSIS — M9905 Segmental and somatic dysfunction of pelvic region: Secondary | ICD-10-CM | POA: Diagnosis not present

## 2021-06-10 DIAGNOSIS — M62838 Other muscle spasm: Secondary | ICD-10-CM | POA: Diagnosis not present

## 2021-06-10 DIAGNOSIS — M9903 Segmental and somatic dysfunction of lumbar region: Secondary | ICD-10-CM | POA: Diagnosis not present

## 2021-06-12 ENCOUNTER — Encounter: Payer: Self-pay | Admitting: Family

## 2021-06-12 ENCOUNTER — Other Ambulatory Visit: Payer: Self-pay | Admitting: Family

## 2021-06-12 DIAGNOSIS — R0602 Shortness of breath: Secondary | ICD-10-CM

## 2021-06-13 DIAGNOSIS — M9902 Segmental and somatic dysfunction of thoracic region: Secondary | ICD-10-CM | POA: Diagnosis not present

## 2021-06-13 DIAGNOSIS — M9903 Segmental and somatic dysfunction of lumbar region: Secondary | ICD-10-CM | POA: Diagnosis not present

## 2021-06-13 DIAGNOSIS — M9905 Segmental and somatic dysfunction of pelvic region: Secondary | ICD-10-CM | POA: Diagnosis not present

## 2021-06-13 DIAGNOSIS — M62838 Other muscle spasm: Secondary | ICD-10-CM | POA: Diagnosis not present

## 2021-06-19 DIAGNOSIS — M9903 Segmental and somatic dysfunction of lumbar region: Secondary | ICD-10-CM | POA: Diagnosis not present

## 2021-06-19 DIAGNOSIS — M9905 Segmental and somatic dysfunction of pelvic region: Secondary | ICD-10-CM | POA: Diagnosis not present

## 2021-06-19 DIAGNOSIS — M9902 Segmental and somatic dysfunction of thoracic region: Secondary | ICD-10-CM | POA: Diagnosis not present

## 2021-06-19 DIAGNOSIS — M62838 Other muscle spasm: Secondary | ICD-10-CM | POA: Diagnosis not present

## 2021-06-25 DIAGNOSIS — M9903 Segmental and somatic dysfunction of lumbar region: Secondary | ICD-10-CM | POA: Diagnosis not present

## 2021-06-25 DIAGNOSIS — M9905 Segmental and somatic dysfunction of pelvic region: Secondary | ICD-10-CM | POA: Diagnosis not present

## 2021-06-25 DIAGNOSIS — M9902 Segmental and somatic dysfunction of thoracic region: Secondary | ICD-10-CM | POA: Diagnosis not present

## 2021-06-25 DIAGNOSIS — M62838 Other muscle spasm: Secondary | ICD-10-CM | POA: Diagnosis not present

## 2021-06-30 ENCOUNTER — Ambulatory Visit: Payer: BC Managed Care – PPO | Admitting: Internal Medicine

## 2021-07-02 DIAGNOSIS — M62838 Other muscle spasm: Secondary | ICD-10-CM | POA: Diagnosis not present

## 2021-07-02 DIAGNOSIS — M9902 Segmental and somatic dysfunction of thoracic region: Secondary | ICD-10-CM | POA: Diagnosis not present

## 2021-07-02 DIAGNOSIS — M9903 Segmental and somatic dysfunction of lumbar region: Secondary | ICD-10-CM | POA: Diagnosis not present

## 2021-07-02 DIAGNOSIS — M9905 Segmental and somatic dysfunction of pelvic region: Secondary | ICD-10-CM | POA: Diagnosis not present

## 2021-07-10 ENCOUNTER — Other Ambulatory Visit: Payer: Self-pay

## 2021-07-10 ENCOUNTER — Ambulatory Visit: Payer: BC Managed Care – PPO | Admitting: Cardiology

## 2021-07-10 ENCOUNTER — Other Ambulatory Visit: Payer: Self-pay | Admitting: Family

## 2021-07-10 ENCOUNTER — Encounter: Payer: Self-pay | Admitting: Cardiology

## 2021-07-10 VITALS — BP 125/73 | HR 82 | Temp 98.1°F | Resp 16 | Ht 68.0 in | Wt 215.0 lb

## 2021-07-10 DIAGNOSIS — Z6832 Body mass index (BMI) 32.0-32.9, adult: Secondary | ICD-10-CM

## 2021-07-10 DIAGNOSIS — E6609 Other obesity due to excess calories: Secondary | ICD-10-CM

## 2021-07-10 DIAGNOSIS — Z13220 Encounter for screening for lipoid disorders: Secondary | ICD-10-CM

## 2021-07-10 DIAGNOSIS — R0602 Shortness of breath: Secondary | ICD-10-CM

## 2021-07-10 DIAGNOSIS — R0609 Other forms of dyspnea: Secondary | ICD-10-CM

## 2021-07-10 DIAGNOSIS — Z79890 Hormone replacement therapy (postmenopausal): Secondary | ICD-10-CM

## 2021-07-10 NOTE — Progress Notes (Signed)
Date:  07/10/2021   ID:  Sherolyn, Pearson [birth year], MRN [id number]  PCP:  Marrian Salvage, FNP  Cardiologist:  Rex Kras, DO, [facility] (established care 07/10/2021)  REASON FOR CONSULT: Shortness of breath  REQUESTING PHYSICIAN:  Marrian Salvage, [address],  [place] [address]  Chief Complaint  Patient presents with   Shortness of Breath   New Patient (Initial Visit)    HPI  Kristie Baldwin is a 50 y.o. Caucasian female who presents to the office with a chief complaint of " shortness of breath." Patient's past medical history and cardiovascular risk factors include: Hormone replacement, obesity due to excess calories.   She is referred to the office at the request of Marrian Salvage,* for evaluation of shortness of breath.  Patient presents today for evaluation of shortness of breath.  Since September 2022 she is noted to be more short of breath predominantly with effort related activities.  Initial working diagnosis was heartburn and patient was placed on pharmacological therapy.  Despite this she continues to have effort related dyspnea.  She denies any orthopnea, paroxysmal nocturnal dyspnea or lower extremity swelling.  No prior history of CAD/PCI/DVT/PE/heart failure/CVA/TIA.  No family history of premature coronary artery disease or sudden cardiac death.  Patient does state since COVID-19 pandemic in 2020 she has been more sedentary than usual, she is now menopausal, and therefore has gained approximately 40 pounds over the last 2 years.  FUNCTIONAL STATUS: No structured exercise program or daily routine.   ALLERGIES: Allergies  Allergen Reactions   Monistat [Miconazole]     Vaginal burning.    MEDICATION LIST PRIOR TO VISIT: Current Meds  Medication Sig   albuterol (VENTOLIN HFA) 108 (90 Base) MCG/ACT inhaler INHALE 2 PUFFS INTO THE LUNGS EVERY 6 HOURS AS NEEDED FOR WHEEZING OR SHORTNESS OF BREATH    Ascorbic Acid (VITAMIN C) 1000 MG tablet Take 1,000 mg by mouth daily.   BIOTIN PO Take by mouth.   Calcium Carb-Cholecalciferol (CALCIUM 1000 + D PO) Take by mouth.   Cholecalciferol (VITAMIN D3 PO) Take by mouth.   COLLAGEN PO Take 1 tablet by mouth daily.   Cyanocobalamin (B-12 PO) Take by mouth.   estradiol (VIVELLE-DOT) 0.05 MG/24HR patch Place 1 patch (0.05 mg total) onto the skin 2 (two) times a week.   ibuprofen (ADVIL) 800 MG tablet Take 1 tablet (800 mg total) by mouth every 8 (eight) hours as needed.   pantoprazole (PROTONIX) 40 MG tablet Take 1 tablet (40 mg total) by mouth 2 (two) times daily before a meal.   progesterone (PROMETRIUM) 200 MG capsule Take 1 capsule (200 mg total) by mouth daily.   valACYclovir (VALTREX) 500 MG tablet Take 1 tablet (500 mg total) by mouth 2 (two) times daily. For 3 days as needed   venlafaxine XR (EFFEXOR XR) 37.5 MG 24 hr capsule Take 1 capsule (37.5 mg total) by mouth daily with breakfast.     PAST MEDICAL HISTORY: Past Medical History:  Diagnosis Date   Abnormal Pap smear of cervix    Depression    Dysmenorrhea    Peptic ulcer    STD (sexually transmitted disease)    HSV    PAST SURGICAL HISTORY: History reviewed. No pertinent surgical history.  FAMILY HISTORY: The patient family history includes Arthritis in her mother; Atrial fibrillation in her father and mother; Heart Problems in her father; Hypertension in her mother; Prostate cancer in her father.  SOCIAL HISTORY:  The patient  reports that she quit smoking about 29 years ago. Her smoking use included cigarettes. She smoked an average of 0.25 packs per day. She has never used smokeless tobacco. She reports current alcohol use of about 2.0 standard drinks per week. She reports that she does not use drugs.  REVIEW OF SYSTEMS: Review of Systems  Constitutional: Negative for chills and fever.  HENT:  Negative for hoarse voice and nosebleeds.   Eyes:  Negative for discharge,  double vision and pain.  Cardiovascular:  Positive for dyspnea on exertion. Negative for chest pain, claudication, leg swelling, near-syncope, orthopnea, palpitations, paroxysmal nocturnal dyspnea and syncope.  Respiratory:  Positive for shortness of breath. Negative for hemoptysis.   Musculoskeletal:  Negative for muscle cramps and myalgias.  Gastrointestinal:  Negative for abdominal pain, constipation, diarrhea, hematemesis, hematochezia, melena, nausea and vomiting.  Neurological:  Negative for brief paralysis and loss of balance.   PHYSICAL EXAM: Vitals with BMI 07/10/2021 05/27/2021 05/02/2021  Height 5\' 8"  5\' 8"  5\' 8"   Weight 215 lbs 210 lbs 6 oz 207 lbs 6 oz  BMI [id number] 32 0000000  Systolic 0000000 [id number]  Diastolic 73 80 70  Pulse 82 94 73    CONSTITUTIONAL: Well-developed and well-nourished. No acute distress.  SKIN: Skin is warm and dry. No rash noted. No cyanosis. No pallor. No jaundice HEAD: Normocephalic and atraumatic.  EYES: No scleral icterus MOUTH/THROAT: Moist oral membranes.  NECK: No JVD present. No thyromegaly noted. No carotid bruits  LYMPHATIC: No visible cervical adenopathy.  CHEST Normal respiratory effort. No intercostal retractions  LUNGS: Clear to auscultation bilaterally.  No stridor. No wheezes. No rales.  CARDIOVASCULAR: Regular rate and rhythm, positive S1-S2, no murmurs rubs or gallops appreciated. ABDOMINAL: Obese, soft, nontender, nondistended, positive bowel sounds all 4 quadrants. No apparent ascites.  EXTREMITIES: No peripheral edema, warm to touch, +2 bilateral DP and PT pulses HEMATOLOGIC: No significant bruising NEUROLOGIC: Oriented to person, place, and time. Nonfocal. Normal muscle tone.  PSYCHIATRIC: Normal mood and affect. Normal behavior. Cooperative  CARDIAC DATABASE: EKG: 07/10/2021: NSR, 75 bpm, without underlying ischemia or injury pattern.    Echocardiogram: No results found for this or any previous visit from the past 1095 days.    Stress Testing: No results found for this or any previous visit from the past 1095 days.  Heart Catheterization: None  LABORATORY DATA: CBC Latest Ref Rng & Units 05/02/2021 01/26/2020 02/07/2019  WBC 3.8 - 10.8 Thousand/uL 8.9 8.5 5.9  Hemoglobin 11.7 - 15.5 g/dL 13.4 12.3 12.3  Hematocrit 35.0 - 45.0 % 41.6 37.6 38.1  Platelets 140 - 400 Thousand/uL 328 276.0 261.0    CMP Latest Ref Rng & Units 05/02/2021 01/26/2020 02/07/2019  Glucose 65 - 99 mg/dL 90 84 84  BUN 7 - 25 mg/dL 10 16 13   Creatinine 0.50 - 1.03 mg/dL 1.08(H) 0.88 0.88  Sodium 135 - 146 mmol/L 139 133(L) 136  Potassium 3.5 - 5.3 mmol/L 4.6 4.0 3.9  Chloride 98 - 110 mmol/L 104 102 103  CO2 20 - 32 mmol/L 27 27 28   Calcium 8.6 - 10.4 mg/dL 9.6 9.3 9.0  Total Protein 6.1 - 8.1 g/dL 7.4 7.3 7.0  Total Bilirubin 0.2 - 1.2 mg/dL 0.4 0.3 0.4  Alkaline Phos 39 - 117 U/L - 63 42  AST 10 - 35 U/L 23 31 23   ALT 6 - 29 U/L 18 28 18     Lipid Panel     Component Value Date/Time  CHOL 162 02/07/2019 0932   TRIG 60.0 02/07/2019 0932   HDL 57.10 02/07/2019 0932   CHOLHDL 3 02/07/2019 0932   VLDL 12.0 02/07/2019 0932   LDLCALC 93 02/07/2019 0932    No components found for: NTPROBNP No results for input(s): PROBNP in the last 8760 hours. Recent Labs    05/02/21 1417  TSH 1.50   Lab Results  Component Value Date   DDIMER 0.28 05/27/2021    BMP Recent Labs    05/02/21 1417  NA 139  K 4.6  CL 104  CO2 27  GLUCOSE 90  BUN 10  CREATININE 1.08*  CALCIUM 9.6    HEMOGLOBIN A1C Lab Results  Component Value Date   HGBA1C 5.1 05/02/2021   MPG 100 05/02/2021    IMPRESSION:    ICD-10-CM   1. Shortness of breath  R06.02 EKG 12-Lead    PCV ECHOCARDIOGRAM COMPLETE    PCV CARDIAC STRESS TEST    2. Hormone replacement therapy  Z79.890     3. Class 1 obesity due to excess calories without serious comorbidity with body mass index (BMI) of 32.0 to 32.9 in adult  E66.09    Z68.32         RECOMMENDATIONS: Kristie Baldwin is a 50 y.o. Caucasian female whose past medical history and cardiac risk factors include: Hormone replacement, obesity due to excess calories.   Patient presents today for evaluation of shortness of breath.  She is been treated with PPI thinking that it may be related to her GERD without any significant improvement in her dyspnea on exertion.  Recently had D-dimers checked which were negative.  TSH within normal limits.  Clinically appears to be euvolemic and not in congestive heart failure.  Plan echocardiogram to evaluate for structural heart disease, LVEF.  Plan exercise treadmill stress test to evaluate for functional status and exercise-induced ischemia.  Patient will have labs with her PCP recently I have asked her to have a fasting lipid profile done to reevaluate her lipids as she has gained approximately 40 pounds since 2020 along with checking a BNP.  I would like to see her back after the testing to reevaluate her symptoms and go over the results.  I have also asked her to keep a log of her blood pressures and to either review it with myself or her PCP.  FINAL MEDICATION LIST END OF ENCOUNTER: No orders of the defined types were placed in this encounter.   Medications Discontinued During This Encounter  Medication Reason   fluticasone (FLOVENT HFA) 110 MCG/ACT inhaler      Current Outpatient Medications:    albuterol (VENTOLIN HFA) 108 (90 Base) MCG/ACT inhaler, INHALE 2 PUFFS INTO THE LUNGS EVERY 6 HOURS AS NEEDED FOR WHEEZING OR SHORTNESS OF BREATH, Disp: 18 g, Rfl: 1   Ascorbic Acid (VITAMIN C) 1000 MG tablet, Take 1,000 mg by mouth daily., Disp: , Rfl:    BIOTIN PO, Take by mouth., Disp: , Rfl:    Calcium Carb-Cholecalciferol (CALCIUM 1000 + D PO), Take by mouth., Disp: , Rfl:    Cholecalciferol (VITAMIN D3 PO), Take by mouth., Disp: , Rfl:    COLLAGEN PO, Take 1 tablet by mouth daily., Disp: , Rfl:    Cyanocobalamin (B-12 PO), Take  by mouth., Disp: , Rfl:    estradiol (VIVELLE-DOT) 0.05 MG/24HR patch, Place 1 patch (0.05 mg total) onto the skin 2 (two) times a week., Disp: 24 patch, Rfl: 4   ibuprofen (ADVIL) 800 MG tablet,  Take 1 tablet (800 mg total) by mouth every 8 (eight) hours as needed., Disp: 30 tablet, Rfl: 1   pantoprazole (PROTONIX) 40 MG tablet, Take 1 tablet (40 mg total) by mouth 2 (two) times daily before a meal., Disp: 60 tablet, Rfl: 3   progesterone (PROMETRIUM) 200 MG capsule, Take 1 capsule (200 mg total) by mouth daily., Disp: 90 capsule, Rfl: 1   valACYclovir (VALTREX) 500 MG tablet, Take 1 tablet (500 mg total) by mouth 2 (two) times daily. For 3 days as needed, Disp: 30 tablet, Rfl: 2   venlafaxine XR (EFFEXOR XR) 37.5 MG 24 hr capsule, Take 1 capsule (37.5 mg total) by mouth daily with breakfast., Disp: 90 capsule, Rfl: 0  Orders Placed This Encounter  Procedures   PCV CARDIAC STRESS TEST   EKG 12-Lead   PCV ECHOCARDIOGRAM COMPLETE    There are no Patient Instructions on file for this visit.   --Continue cardiac medications as reconciled in final medication list. --Return in about 4 weeks (around 08/07/2021) for Follow up, Dyspnea, Review test results. Or sooner if needed. --Continue follow-up with your primary care physician regarding the management of your other chronic comorbid conditions.  Patient's questions and concerns were addressed to her satisfaction. She voices understanding of the instructions provided during this encounter.   This note was created using a voice recognition software as a result there may be grammatical errors inadvertently enclosed that do not reflect the nature of this encounter. Every attempt is made to correct such errors.  Tessa Lerner, [place], [facility]  Pager: [phone] Office: [phone]

## 2021-07-11 DIAGNOSIS — M9902 Segmental and somatic dysfunction of thoracic region: Secondary | ICD-10-CM | POA: Diagnosis not present

## 2021-07-11 DIAGNOSIS — M7541 Impingement syndrome of right shoulder: Secondary | ICD-10-CM | POA: Diagnosis not present

## 2021-07-11 DIAGNOSIS — M9907 Segmental and somatic dysfunction of upper extremity: Secondary | ICD-10-CM | POA: Diagnosis not present

## 2021-07-11 DIAGNOSIS — M9903 Segmental and somatic dysfunction of lumbar region: Secondary | ICD-10-CM | POA: Diagnosis not present

## 2021-07-11 DIAGNOSIS — M9901 Segmental and somatic dysfunction of cervical region: Secondary | ICD-10-CM | POA: Diagnosis not present

## 2021-07-15 ENCOUNTER — Ambulatory Visit: Payer: BC Managed Care – PPO

## 2021-07-15 ENCOUNTER — Other Ambulatory Visit: Payer: Self-pay

## 2021-07-15 DIAGNOSIS — R0602 Shortness of breath: Secondary | ICD-10-CM | POA: Diagnosis not present

## 2021-07-17 ENCOUNTER — Encounter: Payer: Self-pay | Admitting: Obstetrics and Gynecology

## 2021-07-17 ENCOUNTER — Other Ambulatory Visit: Payer: Self-pay

## 2021-07-17 DIAGNOSIS — R8290 Unspecified abnormal findings in urine: Secondary | ICD-10-CM

## 2021-07-17 DIAGNOSIS — R635 Abnormal weight gain: Secondary | ICD-10-CM

## 2021-07-17 NOTE — Telephone Encounter (Signed)
Referral placed to [facility].

## 2021-07-17 NOTE — Telephone Encounter (Signed)
The Effexor should be weight neutral or even help with weight loss.   If she is gaining weight, I would recommend a visit with her PCP for weight management or place a referral to Medical [facility] Management office with [facility]

## 2021-07-18 DIAGNOSIS — M9902 Segmental and somatic dysfunction of thoracic region: Secondary | ICD-10-CM | POA: Diagnosis not present

## 2021-07-18 DIAGNOSIS — M9907 Segmental and somatic dysfunction of upper extremity: Secondary | ICD-10-CM | POA: Diagnosis not present

## 2021-07-18 DIAGNOSIS — M9901 Segmental and somatic dysfunction of cervical region: Secondary | ICD-10-CM | POA: Diagnosis not present

## 2021-07-18 DIAGNOSIS — M9903 Segmental and somatic dysfunction of lumbar region: Secondary | ICD-10-CM | POA: Diagnosis not present

## 2021-07-18 DIAGNOSIS — M7541 Impingement syndrome of right shoulder: Secondary | ICD-10-CM | POA: Diagnosis not present

## 2021-07-24 ENCOUNTER — Ambulatory Visit: Payer: BC Managed Care – PPO

## 2021-07-24 ENCOUNTER — Other Ambulatory Visit: Payer: Self-pay

## 2021-07-24 DIAGNOSIS — R0602 Shortness of breath: Secondary | ICD-10-CM | POA: Diagnosis not present

## 2021-07-29 ENCOUNTER — Other Ambulatory Visit (INDEPENDENT_AMBULATORY_CARE_PROVIDER_SITE_OTHER): Payer: BC Managed Care – PPO

## 2021-07-29 DIAGNOSIS — Z13220 Encounter for screening for lipoid disorders: Secondary | ICD-10-CM

## 2021-07-29 DIAGNOSIS — R0609 Other forms of dyspnea: Secondary | ICD-10-CM | POA: Diagnosis not present

## 2021-07-29 LAB — LIPID PANEL
Cholesterol: 185 mg/dL (ref 0–200)
HDL: 50.5 mg/dL (ref >39.00)
LDL Cholesterol: 116 mg/dL — ABNORMAL HIGH (ref 0–99)
NonHDL: 134.07
Total CHOL/HDL Ratio: 4
Triglycerides: 91 mg/dL (ref 0.0–149.0)
VLDL: 18.2 mg/dL (ref 0.0–40.0)

## 2021-07-29 LAB — BRAIN NATRIURETIC PEPTIDE: Pro B Natriuretic peptide (BNP): 19 pg/mL (ref 0.0–100.0)

## 2021-07-30 ENCOUNTER — Ambulatory Visit: Payer: BC Managed Care – PPO | Admitting: Dietician

## 2021-08-01 DIAGNOSIS — M9902 Segmental and somatic dysfunction of thoracic region: Secondary | ICD-10-CM | POA: Diagnosis not present

## 2021-08-01 DIAGNOSIS — M7541 Impingement syndrome of right shoulder: Secondary | ICD-10-CM | POA: Diagnosis not present

## 2021-08-01 DIAGNOSIS — M9901 Segmental and somatic dysfunction of cervical region: Secondary | ICD-10-CM | POA: Diagnosis not present

## 2021-08-01 DIAGNOSIS — M9907 Segmental and somatic dysfunction of upper extremity: Secondary | ICD-10-CM | POA: Diagnosis not present

## 2021-08-12 ENCOUNTER — Encounter: Payer: Self-pay | Admitting: Obstetrics and Gynecology

## 2021-08-12 ENCOUNTER — Other Ambulatory Visit: Payer: Self-pay | Admitting: Obstetrics and Gynecology

## 2021-08-12 NOTE — Telephone Encounter (Signed)
Annual exam scheduled on 08/20/21.  Patient sent my chart message saying she has only 4 pills left.  Last mammogram was 11/2020

## 2021-08-14 ENCOUNTER — Other Ambulatory Visit: Payer: Self-pay

## 2021-08-14 ENCOUNTER — Encounter: Payer: Self-pay | Admitting: Cardiology

## 2021-08-14 ENCOUNTER — Ambulatory Visit: Payer: BC Managed Care – PPO | Admitting: Cardiology

## 2021-08-14 VITALS — BP 125/67 | HR 79 | Temp 97.8°F | Resp 16 | Ht 68.0 in | Wt 223.2 lb

## 2021-08-14 DIAGNOSIS — E6609 Other obesity due to excess calories: Secondary | ICD-10-CM | POA: Diagnosis not present

## 2021-08-14 DIAGNOSIS — R0602 Shortness of breath: Secondary | ICD-10-CM | POA: Diagnosis not present

## 2021-08-14 DIAGNOSIS — Z6832 Body mass index (BMI) 32.0-32.9, adult: Secondary | ICD-10-CM

## 2021-08-14 DIAGNOSIS — Z79890 Hormone replacement therapy (postmenopausal): Secondary | ICD-10-CM

## 2021-08-14 NOTE — Progress Notes (Signed)
Date:  08/14/2021   ID:  Kristie Baldwin, Kristie Baldwin 05/19/1971, MRN [id number]  PCP:  Marrian Salvage, FNP  Cardiologist:  Rex Kras, DO, [facility] (established care 07/10/2021)  Date: 08/14/21 Last Office Visit: 07/10/2021  Chief Complaint  Patient presents with   Shortness of Breath   Results    Test results   Follow-up    4 weeks    HPI  Kristie Baldwin is a 51 y.o. Caucasian female who presents to the office with a chief complaint of " reevaluation of shortness of breath and discuss test results." Patient's past medical history and cardiovascular risk factors include: Hormone replacement, obesity due to excess calories.   She is referred to the office at the request of Marrian Salvage,* for evaluation of shortness of breath.  Patient initially referred to me in December 2022 for evaluation of shortness of breath.  Since September 2022 she is noticed more shortness of breath with effort related activities.  Initially it was contributed to heartburn and she was started on pharmacological therapy without any much symptom improvement.  In October 2022 due to similar symptoms she had a D-dimer checked which was negative.  TSH is also within normal limits.  At the last visit the shared decision was to proceed with echo and GXT.  Results reviewed with her in detail and noted below for further reference.  Clinically since last office visit she continues to have shortness of breath with predominantly effort related activities.  She feels tired and fatigued.  Patient states that she has " zero energy."  She denies orthopnea, paroxysmal nocturnal dyspnea or lower extremity swelling.  Labs from 07/29/2021 independently reviewed.  BNP within normal limits.  Hemoglobin also is within normal limits at 13.4 g/dL as of September 2022.  Patient is on hormone replacement therapy since October 2020.  However no prolonged car rides/plane rides/immobilization/recent surgeries.  No prior  history of DVT/PE/heart failure.  Other factors to consider is deconditioning as she has gained approximately 40 pounds since 2020 which may be contributing to her symptoms.  She has a referral to pulmonary medicine for evaluation of dyspnea which is still pending.  No hospitalizations or urgent care visits for cardiovascular symptoms her last office encounter.  FUNCTIONAL STATUS: No structured exercise program or daily routine.   ALLERGIES: Allergies  Allergen Reactions   Monistat [Miconazole]     Vaginal burning.    MEDICATION LIST PRIOR TO VISIT: Current Meds  Medication Sig   Ascorbic Acid (VITAMIN C) 1000 MG tablet Take 1,000 mg by mouth daily.   BIOTIN PO Take by mouth.   Calcium Carb-Cholecalciferol (CALCIUM 1000 + D PO) Take by mouth.   Cholecalciferol (VITAMIN D3 PO) Take by mouth.   COLLAGEN PO Take 1 tablet by mouth daily.   Cyanocobalamin (B-12 PO) Take by mouth.   estradiol (VIVELLE-DOT) 0.05 MG/24HR patch Place 1 patch (0.05 mg total) onto the skin 2 (two) times a week.   ibuprofen (ADVIL) 800 MG tablet Take 1 tablet (800 mg total) by mouth every 8 (eight) hours as needed.   pantoprazole (PROTONIX) 40 MG tablet Take 1 tablet (40 mg total) by mouth 2 (two) times daily before a meal.   progesterone (PROMETRIUM) 200 MG capsule TAKE 1 CAPSULE(200 MG) BY MOUTH DAILY   valACYclovir (VALTREX) 500 MG tablet Take 1 tablet (500 mg total) by mouth 2 (two) times daily. For 3 days as needed   venlafaxine XR (EFFEXOR XR) 37.5 MG 24 hr capsule  Take 1 capsule (37.5 mg total) by mouth daily with breakfast.     PAST MEDICAL HISTORY: Past Medical History:  Diagnosis Date   Abnormal Pap smear of cervix    Depression    Dysmenorrhea    Peptic ulcer    STD (sexually transmitted disease)    HSV    PAST SURGICAL HISTORY: History reviewed. No pertinent surgical history.  FAMILY HISTORY: The patient family history includes Arthritis in her mother; Atrial fibrillation in her father  and mother; Heart Problems in her father; Hypertension in her mother; Prostate cancer in her father.  SOCIAL HISTORY:  The patient  reports that she quit smoking about 30 years ago. Her smoking use included cigarettes. She smoked an average of 0.25 packs per day. She has never used smokeless tobacco. She reports current alcohol use of about 2.0 standard drinks per week. She reports that she does not use drugs.  REVIEW OF SYSTEMS: Review of Systems  Constitutional: Positive for malaise/fatigue. Negative for chills and fever.  HENT:  Negative for hoarse voice and nosebleeds.   Eyes:  Negative for discharge, double vision and pain.  Cardiovascular:  Positive for dyspnea on exertion. Negative for chest pain, claudication, leg swelling, near-syncope, orthopnea, palpitations, paroxysmal nocturnal dyspnea and syncope.  Respiratory:  Positive for shortness of breath. Negative for hemoptysis.   Musculoskeletal:  Negative for muscle cramps and myalgias.       Muscle ache  Gastrointestinal:  Negative for abdominal pain, constipation, diarrhea, hematemesis, hematochezia, melena, nausea and vomiting.  Neurological:  Negative for brief paralysis and loss of balance.   PHYSICAL EXAM: Vitals with BMI 08/14/2021 07/10/2021 05/27/2021  Height 5\' 8"  5\' 8"  5\' 8"   Weight 223 lbs 3 oz 215 lbs 210 lbs 6 oz  BMI [id number]  Systolic 0000000 0000000 [id number]  Diastolic 67 73 80  Pulse 79 82 94    CONSTITUTIONAL: Well-developed and well-nourished. No acute distress.  SKIN: Skin is warm and dry. No rash noted. No cyanosis. No pallor. No jaundice HEAD: Normocephalic and atraumatic.  EYES: No scleral icterus MOUTH/THROAT: Moist oral membranes.  NECK: No JVD present. No thyromegaly noted. No carotid bruits  LYMPHATIC: No visible cervical adenopathy.  CHEST Normal respiratory effort. No intercostal retractions  LUNGS: Clear to auscultation bilaterally.  No stridor. No wheezes. No rales.  CARDIOVASCULAR: Regular rate and  rhythm, positive S1-S2, no murmurs rubs or gallops appreciated. ABDOMINAL: Obese, soft, nontender, nondistended, positive bowel sounds all 4 quadrants. No apparent ascites.  EXTREMITIES: No peripheral edema, warm to touch, +2 bilateral DP and PT pulses HEMATOLOGIC: No significant bruising NEUROLOGIC: Oriented to person, place, and time. Nonfocal. Normal muscle tone.  PSYCHIATRIC: Normal mood and affect. Normal behavior. Cooperative  CARDIAC DATABASE: EKG: 07/10/2021: NSR, 75 bpm, without underlying ischemia or injury pattern.    Echocardiogram: 07/15/2021: Left ventricle cavity is normal in size and wall thickness. Normal global wall motion. Normal LV systolic function with EF 57%. Normal diastolic filling pattern.  Mild (Grade I) mitral regurgitation. Mild tricuspid regurgitation.  Mild pulmonic regurgitation. No evidence of pulmonary hypertension.   Stress Testing: Exercise treadmill stress test 07/25/2021: Exercise treadmill stress test performed using Bruce protocol. Patient reached 7.4 METS, and 101% of age predicted maximum heart rate. Exercise capacity was low. No chest pain reported. Normal heart rate and hemodynamic response. Stress EKG revealed no ischemic changes. Low risk study.  Heart Catheterization: None  LABORATORY DATA: CBC Latest Ref Rng & Units 05/02/2021 01/26/2020 02/07/2019  WBC 3.8 -  10.8 Thousand/uL 8.9 8.5 5.9  Hemoglobin 11.7 - 15.5 g/dL 13.4 12.3 12.3  Hematocrit 35.0 - 45.0 % 41.6 37.6 38.1  Platelets 140 - 400 Thousand/uL 328 276.0 261.0    CMP Latest Ref Rng & Units 05/02/2021 01/26/2020 02/07/2019  Glucose 65 - 99 mg/dL 90 84 84  BUN 7 - 25 mg/dL 10 16 13   Creatinine 0.50 - 1.03 mg/dL 1.08(H) 0.88 0.88  Sodium 135 - 146 mmol/L 139 133(L) 136  Potassium 3.5 - 5.3 mmol/L 4.6 4.0 3.9  Chloride 98 - 110 mmol/L 104 102 103  CO2 20 - 32 mmol/L 27 27 28   Calcium 8.6 - 10.4 mg/dL 9.6 9.3 9.0  Total Protein 6.1 - 8.1 g/dL 7.4 7.3 7.0  Total Bilirubin 0.2 -  1.2 mg/dL 0.4 0.3 0.4  Alkaline Phos 39 - 117 U/L - 63 42  AST 10 - 35 U/L 23 31 23   ALT 6 - 29 U/L 18 28 18     Lipid Panel     Component Value Date/Time   CHOL 185 07/29/2021 0745   TRIG 91.0 07/29/2021 0745   HDL 50.50 07/29/2021 0745   CHOLHDL 4 07/29/2021 0745   VLDL 18.2 07/29/2021 0745   LDLCALC 116 (H) 07/29/2021 0745    No components found for: NTPROBNP Recent Labs    07/29/21 0745  PROBNP 19.0   Recent Labs    05/02/21 1417  TSH 1.50   Lab Results  Component Value Date   DDIMER 0.28 05/27/2021    BMP Recent Labs    05/02/21 1417  NA 139  K 4.6  CL 104  CO2 27  GLUCOSE 90  BUN 10  CREATININE 1.08*  CALCIUM 9.6    HEMOGLOBIN A1C Lab Results  Component Value Date   HGBA1C 5.1 05/02/2021   MPG 100 05/02/2021    IMPRESSION:    ICD-10-CM   1. Shortness of breath  R06.02 D-dimer, quantitative    2. Hormone replacement therapy  Z79.890     3. Class 1 obesity due to excess calories without serious comorbidity with body mass index (BMI) of 32.0 to 32.9 in adult  E66.09    Z68.32         RECOMMENDATIONS: Eleyah Hanel is a 51 y.o. Caucasian female whose past medical history and cardiac risk factors include: Hormone replacement, obesity due to excess calories.   Shortness of breath: Since September 2020 -progressive. Echocardiogram: Preserved LVEF, normal diastolic function, and mild valvular heart disease.  No significant RVSP to suggest pulmonary hypertension GXT: Low risk stress test. TSH: Within normal limits. D-dimers: Checked in October 2020 within normal limits. Hemoglobin: 13.4 g/dL (04/2021). BNP: 19 The symptoms have started since October 2022 according to the patient.  Very similar to the time when she started hormone replacement therapy.  Since then she is also gained 40 pounds which may be contributing to her underlying deconditioning and shortness of breath.  I have asked her to follow-up with her provider to see if the HRT  may be contributing to her symptoms.  Educated importance of losing weight and increasing physical activity as tolerated.  She has a referral to see pulmonary medicine which I have strongly encouraged.  I will recheck a D-dimer as she is on hormone replacement therapy.  If the D-dimers are elevated will recommend additional testing.  We discussed proceeding with coronary CTA; however, the shared decision was to hold off on coronary CTA for now until her pulmonary work-up is complete.  Educated  on seeking medical attention sooner by going to the closest ER via EMS if the symptoms increase in intensity, frequency, duration, or has typical chest pain as discussed in the office.  Patient verbalized understanding.  I will see her back in 3 months or sooner if change in clinical status.  Patient is agreeable with the plan of care and is thankful.  FINAL MEDICATION LIST END OF ENCOUNTER: No orders of the defined types were placed in this encounter.    Medications Discontinued During This Encounter  Medication Reason   albuterol (VENTOLIN HFA) 108 (90 Base) MCG/ACT inhaler      Current Outpatient Medications:    Ascorbic Acid (VITAMIN C) 1000 MG tablet, Take 1,000 mg by mouth daily., Disp: , Rfl:    BIOTIN PO, Take by mouth., Disp: , Rfl:    Calcium Carb-Cholecalciferol (CALCIUM 1000 + D PO), Take by mouth., Disp: , Rfl:    Cholecalciferol (VITAMIN D3 PO), Take by mouth., Disp: , Rfl:    COLLAGEN PO, Take 1 tablet by mouth daily., Disp: , Rfl:    Cyanocobalamin (B-12 PO), Take by mouth., Disp: , Rfl:    estradiol (VIVELLE-DOT) 0.05 MG/24HR patch, Place 1 patch (0.05 mg total) onto the skin 2 (two) times a week., Disp: 24 patch, Rfl: 4   ibuprofen (ADVIL) 800 MG tablet, Take 1 tablet (800 mg total) by mouth every 8 (eight) hours as needed., Disp: 30 tablet, Rfl: 1   pantoprazole (PROTONIX) 40 MG tablet, Take 1 tablet (40 mg total) by mouth 2 (two) times daily before a meal., Disp: 60 tablet, Rfl:  3   progesterone (PROMETRIUM) 200 MG capsule, TAKE 1 CAPSULE(200 MG) BY MOUTH DAILY, Disp: 30 capsule, Rfl: 0   valACYclovir (VALTREX) 500 MG tablet, Take 1 tablet (500 mg total) by mouth 2 (two) times daily. For 3 days as needed, Disp: 30 tablet, Rfl: 2   venlafaxine XR (EFFEXOR XR) 37.5 MG 24 hr capsule, Take 1 capsule (37.5 mg total) by mouth daily with breakfast., Disp: 90 capsule, Rfl: 0  Orders Placed This Encounter  Procedures   D-dimer, quantitative    There are no Patient Instructions on file for this visit.   --Continue cardiac medications as reconciled in final medication list. --Return in about 3 months (around 11/12/2021) for Review test results, Dyspnea. Or sooner if needed. --Continue follow-up with your primary care physician regarding the management of your other chronic comorbid conditions.  Patient's questions and concerns were addressed to her satisfaction. She voices understanding of the instructions provided during this encounter.   This note was created using a voice recognition software as a result there may be grammatical errors inadvertently enclosed that do not reflect the nature of this encounter. Every attempt is made to correct such errors.  Rex Kras, [place], [facility]  Pager: [phone] Office: [phone]

## 2021-08-15 ENCOUNTER — Ambulatory Visit: Payer: BC Managed Care – PPO | Admitting: Pulmonary Disease

## 2021-08-15 ENCOUNTER — Encounter: Payer: Self-pay | Admitting: Pulmonary Disease

## 2021-08-15 VITALS — BP 110/70 | HR 78 | Temp 98.0°F | Ht 68.0 in | Wt 222.0 lb

## 2021-08-15 DIAGNOSIS — R0602 Shortness of breath: Secondary | ICD-10-CM

## 2021-08-15 MED ORDER — ALBUTEROL SULFATE HFA 108 (90 BASE) MCG/ACT IN AERS: 2.0000 | INHALATION_SPRAY | Freq: Four times a day (QID) | RESPIRATORY_TRACT | 3 refills | Status: DC | PRN

## 2021-08-15 NOTE — Patient Instructions (Signed)
Prescription for albuterol to be used as needed  We will get a breathing study on you the next time you can then  I will see you in 4 to 6 weeks  Graded exercises as tolerated  Call with significant concerns  Continue to optimize behavioral changes to manage your reflux

## 2021-08-15 NOTE — Progress Notes (Signed)
Kristie Baldwin    [id number]    Feb 19, 1971  Primary Care Physician:Murray, Allyne Gee, FNP  Referring Physician: Olive Bass, FNP [address] [place],  [place] [address]  Chief complaint:   Patient being seen for shortness of breath  HPI:  Shortness of breath started about September Does not recollect having any acute illness at the time  No history of asthma, no history of prior lung disease  Social smoking in her 73s  History of reflux for which she is on medications, behavioral modifications  Occasional wheezes  Has had some fatigue, some muscle aches  About a year ago was able to run, significant walking without limitations  Recently evaluated by cardiology-had echocardiogram, stress testing performed with no significant findings   Outpatient Encounter Medications as of 08/15/2021  Medication Sig   Ascorbic Acid (VITAMIN C) 1000 MG tablet Take 1,000 mg by mouth daily.   BIOTIN PO Take by mouth.   Calcium Carb-Cholecalciferol (CALCIUM 1000 + D PO) Take by mouth.   Cholecalciferol (VITAMIN D3 PO) Take by mouth.   COLLAGEN PO Take 1 tablet by mouth daily.   Cyanocobalamin (B-12 PO) Take by mouth.   estradiol (VIVELLE-DOT) 0.05 MG/24HR patch Place 1 patch (0.05 mg total) onto the skin 2 (two) times a week.   ibuprofen (ADVIL) 800 MG tablet Take 1 tablet (800 mg total) by mouth every 8 (eight) hours as needed.   pantoprazole (PROTONIX) 40 MG tablet Take 1 tablet (40 mg total) by mouth 2 (two) times daily before a meal.   progesterone (PROMETRIUM) 200 MG capsule TAKE 1 CAPSULE(200 MG) BY MOUTH DAILY   valACYclovir (VALTREX) 500 MG tablet Take 1 tablet (500 mg total) by mouth 2 (two) times daily. For 3 days as needed   venlafaxine XR (EFFEXOR XR) 37.5 MG 24 hr capsule Take 1 capsule (37.5 mg total) by mouth daily with breakfast.   No facility-administered encounter medications on file as of 08/15/2021.    Allergies as of  08/15/2021 - Review Complete 08/15/2021  Allergen Reaction Noted   Monistat [miconazole] Other (See Comments) 08/10/2019    Past Medical History:  Diagnosis Date   Abnormal Pap smear of cervix    Depression    Dysmenorrhea    Peptic ulcer    STD (sexually transmitted disease)    HSV    No past surgical history on file.  Family History  Problem Relation Age of Onset   Hypertension Mother    Arthritis Mother        OA   Atrial fibrillation Mother        medication   Heart Problems Father        pace maker 2018   Prostate cancer Father    Atrial fibrillation Father    Breast cancer Neg Hx    Colon cancer Neg Hx    Stomach cancer Neg Hx    Pancreatic cancer Neg Hx     Social History   Socioeconomic History   Marital status: Married    Spouse name: Not on file   Number of children: 0   Years of education: Not on file   Highest education level: Not on file  Occupational History    Employer: [employer]  Tobacco Use   Smoking status: Former    Packs/day: 0.25    Years: 0.00    Pack years: 0.00    Types: Cigarettes    Quit  date: 08/11/1991    Years since quitting: 30.0   Smokeless tobacco: Never  Vaping Use   Vaping Use: Never used  Substance and Sexual Activity   Alcohol use: Yes    Alcohol/week: 2.0 standard drinks    Types: 2 Standard drinks or equivalent per week    Comment: occasionally   Drug use: No   Sexual activity: Not Currently    Partners: Male    Birth control/protection: Post-menopausal  Other Topics Concern   Not on file  Social History Narrative   Not on file   Social Determinants of Health   Financial Resource Strain: Not on file  Food Insecurity: Not on file  Transportation Needs: Not on file  Physical Activity: Not on file  Stress: Not on file  Social Connections: Not on file  Intimate Partner Violence: Not on file    Review of Systems  Constitutional:  Positive for fatigue.  Respiratory:  Positive for shortness of breath  and wheezing.    Vitals:   08/15/21 0959  BP: 110/70  Pulse: 78  Temp: 98 F (36.7 C)  SpO2: 99%     Physical Exam Constitutional:      Appearance: She is obese.  HENT:     Head: Normocephalic.     Nose: No congestion.     Mouth/Throat:     Mouth: Mucous membranes are moist.  Cardiovascular:     Rate and Rhythm: Normal rate and regular rhythm.     Heart sounds: No murmur heard.   No friction rub.  Pulmonary:     Effort: No respiratory distress.     Breath sounds: No stridor. No wheezing or rhonchi.  Musculoskeletal:     Cervical back: No rigidity or tenderness.  Neurological:     Mental Status: She is alert.  Psychiatric:        Mood and Affect: Mood normal.     Data Reviewed: Recent chest x-ray 05/27/2021-no acute infiltrate  Echocardiogram, normal left ventricular function, normal right side of the heart, no pulmonary hypertension  Stress test within normal limits Assessment:  Shortness of breath, wheezing  No underlying lung disease no intubation  Recent cardiac work-up negative  There is some element of bronchospasms-no history of allergies or asthma  Plan/Recommendations: Obtain pulmonary function test  Graded exercise as tolerated  Empiric trial with albuterol to be used as needed  Follow-up in 4 to 6 weeks  Some component of deconditioning may be present-may have had a viral syndrome leaving her with post viral fatigue  Encouraged to call if any significant concerns   Virl Diamond MD  Pulmonary and Critical Care 08/15/2021, 10:24 AM  CC: Olive Bass,*

## 2021-08-18 NOTE — Progress Notes (Signed)
51 y.o. [id number] Married Caucasian female here for annual exam.    Patient complaining of right vulvar lesion, starting in December, 2022.  Increasing in size.  Has had this in the past.   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  For the last 2 weeks she is not feeling well due to body aches.  She did take a course of valtrex. Noticing increasing frequency of outbreaks.   On HRT.  She wants to continue to control her hot flashes.   She is on Effexor.  She ran out at one point, and she did not feel right.  Experienced brain fog.  It does help her to control depression.   Some more recent shortness of breath.  Had a normal cardiac ECHO and stress testing.  Now she is doing a pulmonary evaluation.  Started an inhaler and will do a breathing test.  PCP:   Ria ClockLaura Murray, FNP  Patient's last menstrual period was 10/05/2019 (exact date).           Sexually active: No. No interest The current method of family planning is post menopausal status.    Exercising: No.  The patient does not participate in regular exercise at present. Smoker:  no  Health Maintenance: Pap:  11-12-20 Neg:Neg HR HPV, 09-16-15 Neg:Neg HR HPV, 2014 Neg History of abnormal Pap:  Yes, remote hx MMG:  11-19-20, BI-RADS1, cat B density.  Colonoscopy:  Never.   BMD:   n/a  Result  n/a TDaP:  2013 Gardasil:   no HIV: Neg years ago Hep C: Neg years ago Screening Labs:  PCP   reports that she quit smoking about 30 years ago. Her smoking use included cigarettes. She smoked an average of 0.25 packs per day. She has never used smokeless tobacco. She reports current alcohol use of about 2.0 standard drinks per week. She reports that she does not use drugs.  Past Medical History:  Diagnosis Date   Abnormal Pap smear of cervix    Depression    Dysmenorrhea    Peptic ulcer    STD (sexually transmitted disease)    HSV    History reviewed. No pertinent surgical history.  Current Outpatient Medications  Medication Sig  Dispense Refill   albuterol (VENTOLIN HFA) 108 (90 Base) MCG/ACT inhaler Inhale 2 puffs into the lungs every 6 (six) hours as needed for wheezing or shortness of breath. 8 g 3   Ascorbic Acid (VITAMIN C) 1000 MG tablet Take 1,000 mg by mouth daily.     BIOTIN PO Take by mouth.     Calcium Carb-Cholecalciferol (CALCIUM 1000 + D PO) Take by mouth.     Cholecalciferol (VITAMIN D3 PO) Take by mouth.     COLLAGEN PO Take 1 tablet by mouth daily.     Cyanocobalamin (B-12 PO) Take by mouth.     estradiol (VIVELLE-DOT) 0.05 MG/24HR patch Place 1 patch (0.05 mg total) onto the skin 2 (two) times a week. 24 patch 4   ibuprofen (ADVIL) 800 MG tablet Take 1 tablet (800 mg total) by mouth every 8 (eight) hours as needed. 30 tablet 1   pantoprazole (PROTONIX) 40 MG tablet Take 1 tablet (40 mg total) by mouth 2 (two) times daily before a meal. 60 tablet 3   progesterone (PROMETRIUM) 200 MG capsule TAKE 1 CAPSULE(200 MG) BY MOUTH DAILY 30 capsule 0   valACYclovir (VALTREX) 500 MG tablet Take 1 tablet (500 mg total) by mouth 2 (two) times daily. For 3 days as needed  30 tablet 2   venlafaxine XR (EFFEXOR XR) 37.5 MG 24 hr capsule Take 1 capsule (37.5 mg total) by mouth daily with breakfast. 90 capsule 0   No current facility-administered medications for this visit.    Family History  Problem Relation Age of Onset   Hypertension Mother    Arthritis Mother        OA   Atrial fibrillation Mother        medication   Heart Problems Father        pace maker 2018   Prostate cancer Father    Atrial fibrillation Father    Breast cancer Neg Hx    Colon cancer Neg Hx    Stomach cancer Neg Hx    Pancreatic cancer Neg Hx     Review of Systems  Genitourinary:  Positive for vaginal pain (right vulvar lesion).  All other systems reviewed and are negative.  Exam:   BP 110/76    Pulse 79    Ht 5' 7.5" (1.715 m)    Wt 221 lb (100.2 kg)    LMP 10/05/2019 (Exact Date) Comment: 09-30-2020 bleeding   SpO2 98%    BMI  34.10 kg/m     General appearance: alert, cooperative and appears stated age Head: normocephalic, without obvious abnormality, atraumatic Neck: no adenopathy, supple, symmetrical, trachea midline and thyroid normal to inspection and palpation Lungs: clear to auscultation bilaterally Breasts: normal appearance, no masses or tenderness, No nipple retraction or dimpling, No nipple discharge or bleeding, No axillary adenopathy Heart: regular rate and rhythm Abdomen: soft, non-tender; no masses, no organomegaly Extremities: extremities normal, atraumatic, no cyanosis or edema Skin: skin color, texture, turgor normal. No rashes or lesions Lymph nodes: cervical, supraclavicular, and axillary nodes normal. Neurologic: grossly normal  Pelvic: External genitalia:  1.3 cm right superior vulvar tender cystic skin lesion with mild erythema.                No abnormal inguinal nodes palpated.              Urethra:  normal appearing urethra with no masses, tenderness or lesions              Bartholins and Skenes: normal                 Vagina: normal appearing vagina with normal color and discharge, no lesions              Cervix: no lesions              Pap taken: no Bimanual Exam:  Uterus:  normal size, contour, position, consistency, mobility, non-tender              Adnexa: no mass, fullness, tenderness              Rectal exam: yes.  Confirms.              Anus:  normal sphincter tone, palpable area at 12:00, possible hemorrhoid.   Chaperone was present for exam:  Marchelle Folks, CMA  Assessment:   Well woman visit with gynecologic exam. HRT.  HSV.  Recurrent outbreaks.  Vulvar lesion.  Infected sebaceous cyst.  Depression.  On Effexor.  Hx rectal nodule.  Colon cancer screening.   Plan: Mammogram screening discussed. Self breast awareness reviewed. Pap and HR HPV as above. Guidelines for Calcium, Vitamin D, regular exercise program including cardiovascular and weight bearing exercise. Refill  of Vivelle dot and Prometrium.  Discused WHI and use  of HRT which can increase risk of PE, DVT, MI, stroke and breast cancer.  Start Valtrex daily and then take bid for an outbreak.  Refill of Effexor XR. Referral for colonoscopy.   Follow up annually and prn.   In addition to the annual exam to day, patient was evaluated and treated for an infected sebaceous cyst of the vulva.  Rx given for Bactrim DS po bid x 7 days and Diflucan 150 mg po x 1 prn yeast infection with repeat dose in 72 hours prn.  Patient educated that the cyst may not completely resolve without excision as is appear to be an infected sebaceous cyst.  Return if not improved.   After visit summary provided.

## 2021-08-20 ENCOUNTER — Ambulatory Visit (INDEPENDENT_AMBULATORY_CARE_PROVIDER_SITE_OTHER): Payer: BC Managed Care – PPO | Admitting: Obstetrics and Gynecology

## 2021-08-20 ENCOUNTER — Other Ambulatory Visit: Payer: Self-pay

## 2021-08-20 ENCOUNTER — Encounter: Payer: Self-pay | Admitting: Obstetrics and Gynecology

## 2021-08-20 VITALS — BP 110/76 | HR 79 | Ht 67.5 in | Wt 221.0 lb

## 2021-08-20 DIAGNOSIS — Z8619 Personal history of other infectious and parasitic diseases: Secondary | ICD-10-CM

## 2021-08-20 DIAGNOSIS — L723 Sebaceous cyst: Secondary | ICD-10-CM | POA: Diagnosis not present

## 2021-08-20 DIAGNOSIS — Z1211 Encounter for screening for malignant neoplasm of colon: Secondary | ICD-10-CM

## 2021-08-20 DIAGNOSIS — N951 Menopausal and female climacteric states: Secondary | ICD-10-CM | POA: Diagnosis not present

## 2021-08-20 DIAGNOSIS — L089 Local infection of the skin and subcutaneous tissue, unspecified: Secondary | ICD-10-CM

## 2021-08-20 DIAGNOSIS — Z01419 Encounter for gynecological examination (general) (routine) without abnormal findings: Secondary | ICD-10-CM

## 2021-08-20 MED ORDER — SULFAMETHOXAZOLE-TRIMETHOPRIM 800-160 MG PO TABS: 1.0000 | ORAL_TABLET | Freq: Two times a day (BID) | ORAL | 0 refills | Status: DC

## 2021-08-20 MED ORDER — VENLAFAXINE HCL ER 37.5 MG PO CP24: 37.5000 mg | ORAL_CAPSULE | Freq: Every day | ORAL | 3 refills | Status: DC

## 2021-08-20 MED ORDER — PROGESTERONE 200 MG PO CAPS: ORAL_CAPSULE | ORAL | 3 refills | Status: DC

## 2021-08-20 MED ORDER — VALACYCLOVIR HCL 500 MG PO TABS: ORAL_TABLET | ORAL | 3 refills | Status: DC

## 2021-08-20 MED ORDER — FLUCONAZOLE 150 MG PO TABS
150.0000 mg | ORAL_TABLET | Freq: Once | ORAL | 0 refills | Status: AC
Start: 2021-08-20 — End: 2021-08-20

## 2021-08-20 MED ORDER — ESTRADIOL 0.05 MG/24HR TD PTTW: 1.0000 | MEDICATED_PATCH | TRANSDERMAL | 3 refills | Status: DC

## 2021-08-20 NOTE — Patient Instructions (Signed)

## 2021-08-22 ENCOUNTER — Ambulatory Visit: Payer: BC Managed Care – PPO | Admitting: Cardiology

## 2021-08-22 ENCOUNTER — Encounter: Payer: Self-pay | Admitting: Family

## 2021-08-22 ENCOUNTER — Ambulatory Visit: Payer: BC Managed Care – PPO | Admitting: Family

## 2021-08-22 VITALS — BP 110/70 | HR 88 | Temp 97.8°F | Ht 68.0 in | Wt 224.0 lb

## 2021-08-22 DIAGNOSIS — M791 Myalgia, unspecified site: Secondary | ICD-10-CM | POA: Diagnosis not present

## 2021-08-22 DIAGNOSIS — R300 Dysuria: Secondary | ICD-10-CM

## 2021-08-22 DIAGNOSIS — E538 Deficiency of other specified B group vitamins: Secondary | ICD-10-CM

## 2021-08-22 DIAGNOSIS — R5383 Other fatigue: Secondary | ICD-10-CM

## 2021-08-22 LAB — COMPREHENSIVE METABOLIC PANEL
ALT: 20 U/L (ref 0–35)
AST: 21 U/L (ref 0–37)
Albumin: 4.3 g/dL (ref 3.5–5.2)
Alkaline Phosphatase: 59 U/L (ref 39–117)
BUN: 14 mg/dL (ref 6–23)
CO2: 29 mEq/L (ref 19–32)
Calcium: 9.2 mg/dL (ref 8.4–10.5)
Chloride: 103 mEq/L (ref 96–112)
Creatinine, Ser: 1.06 mg/dL (ref 0.40–1.20)
GFR: 60.55 mL/min (ref >60.00)
Glucose, Bld: 76 mg/dL (ref 70–99)
Potassium: 4.2 mEq/L (ref 3.5–5.1)
Sodium: 139 mEq/L (ref 135–145)
Total Bilirubin: 0.4 mg/dL (ref 0.2–1.2)
Total Protein: 7 g/dL (ref 6.0–8.3)

## 2021-08-22 LAB — CBC WITH DIFFERENTIAL/PLATELET
Basophils Absolute: 0.1 10*3/uL (ref 0.0–0.1)
Basophils Relative: 0.9 % (ref 0.0–3.0)
Eosinophils Absolute: 0.2 10*3/uL (ref 0.0–0.7)
Eosinophils Relative: 2.5 % (ref 0.0–5.0)
HCT: 38.3 % (ref 36.0–46.0)
Hemoglobin: 12.3 g/dL (ref 12.0–15.0)
Lymphocytes Relative: 17.9 % (ref 12.0–46.0)
Lymphs Abs: 1.3 10*3/uL (ref 0.7–4.0)
MCHC: 32.1 g/dL (ref 30.0–36.0)
MCV: 84.4 fl (ref 78.0–100.0)
Monocytes Absolute: 0.4 10*3/uL (ref 0.1–1.0)
Monocytes Relative: 5.9 % (ref 3.0–12.0)
Neutro Abs: 5.3 10*3/uL (ref 1.4–7.7)
Neutrophils Relative %: 72.8 % (ref 43.0–77.0)
Platelets: 300 10*3/uL (ref 150.0–400.0)
RBC: 4.54 Mil/uL (ref 3.87–5.11)
RDW: 13.6 % (ref 11.5–15.5)
WBC: 7.3 10*3/uL (ref 4.0–10.5)

## 2021-08-22 LAB — TSH: TSH: 0.99 u[IU]/mL (ref 0.35–5.50)

## 2021-08-22 LAB — VITAMIN B12: Vitamin B-12: 475 pg/mL (ref 211–911)

## 2021-08-22 LAB — SEDIMENTATION RATE: Sed Rate: 11 mm/hr (ref 0–30)

## 2021-08-22 NOTE — Progress Notes (Signed)
Kristie Baldwin is a 51 y.o. female with the following history as recorded in [employer]:  Patient Active Problem List   Diagnosis Date Noted   Weight loss counseling, encounter for 12/17/2017   Obesity 06/04/2017   NAFLD (nonalcoholic fatty liver disease) 12/15/2016    Current Outpatient Medications  Medication Sig Dispense Refill   albuterol (VENTOLIN HFA) 108 (90 Base) MCG/ACT inhaler Inhale 2 puffs into the lungs every 6 (six) hours as needed for wheezing or shortness of breath. 8 g 3   Ascorbic Acid (VITAMIN C) 1000 MG tablet Take 1,000 mg by mouth daily.     BIOTIN PO Take by mouth.     Calcium Carb-Cholecalciferol (CALCIUM 1000 + D PO) Take by mouth.     Cholecalciferol (VITAMIN D3 PO) Take by mouth.     COLLAGEN PO Take 1 tablet by mouth daily.     Cyanocobalamin (B-12 PO) Take by mouth.     estradiol (VIVELLE-DOT) 0.05 MG/24HR patch Place 1 patch (0.05 mg total) onto the skin 2 (two) times a week. 24 patch 3   ibuprofen (ADVIL) 800 MG tablet Take 1 tablet (800 mg total) by mouth every 8 (eight) hours as needed. 30 tablet 1   pantoprazole (PROTONIX) 40 MG tablet Take 1 tablet (40 mg total) by mouth 2 (two) times daily before a meal. 60 tablet 3   progesterone (PROMETRIUM) 200 MG capsule TAKE 1 CAPSULE(200 MG) BY MOUTH AT BEDTIME. 90 capsule 3   valACYclovir (VALTREX) 500 MG tablet Take one tablet (500 mg) by mouth daily.   Take one tablet (500 mg) by mouth twice a day for 3 days as needed for an outbreak. 110 tablet 3   venlafaxine XR (EFFEXOR XR) 37.5 MG 24 hr capsule Take 1 capsule (37.5 mg total) by mouth daily with breakfast. 90 capsule 3   sulfamethoxazole-trimethoprim (BACTRIM DS) 800-160 MG tablet Take 1 tablet by mouth 2 (two) times daily. One PO BID x 7 days (Patient not taking: Reported on 08/22/2021) 14 tablet 0   No current facility-administered medications for this visit.    Allergies: Monistat [miconazole]  Past Medical History:  Diagnosis Date   Abnormal Pap  smear of cervix    Depression    Dysmenorrhea    Peptic ulcer    STD (sexually transmitted disease)    HSV    No past surgical history on file.  Family History  Problem Relation Age of Onset   Hypertension Mother    Arthritis Mother        OA   Atrial fibrillation Mother        medication   Heart Problems Father        pace maker 2018   Prostate cancer Father    Atrial fibrillation Father    Breast cancer Neg Hx    Colon cancer Neg Hx    Stomach cancer Neg Hx    Pancreatic cancer Neg Hx     Social History   Tobacco Use   Smoking status: Former    Packs/day: 0.25    Years: 0.00    Pack years: 0.00    Types: Cigarettes    Quit date: 08/11/1991    Years since quitting: 30.0   Smokeless tobacco: Never  Substance Use Topics   Alcohol use: Yes    Alcohol/week: 2.0 standard drinks    Types: 2 Standard drinks or equivalent per week    Comment: occasionally    Subjective:  Complaining of "feeling bad" on and  off for 2 weeks; notes that has intermittent symptoms of body aches/ feeling tired; can feel fine for a day and then the next day "just very tired." No fever; notes that appetite "comes and goes." Has taken COVID test- negative;  Saw her GYN earlier this week- has vaginal cyst; was prescribed antibiotic but has not been able to start;  Does note that symptoms did correlate with HSV outbreak; feels like she just hasn't been able to get over the HSV outbreak; planning to start Valtrex daily;      Objective:  Vitals:   08/22/21 0847  BP: 110/70  Pulse: 88  Temp: 97.8 F (36.6 C)  TempSrc: Oral  SpO2: 97%  Weight: 224 lb (101.6 kg)  Height: _0  (1.727 m)    General: Well developed, well nourished, in no acute distress  Skin : Warm and dry.  Head: Normocephalic and atraumatic  Eyes: Sclera and conjunctiva clear; pupils round and reactive to light; extraocular movements intact  Ears: External normal; canals clear; tympanic membranes normal  Oropharynx: Pink,  supple. No suspicious lesions  Neck: Supple without thyromegaly, adenopathy  Lungs: Respirations unlabored; clear to auscultation bilaterally without wheeze, rales, rhonchi  CVS exam: normal rate and regular rhythm.  Neurologic: Alert and oriented; speech intact; face symmetrical; moves all extremities well; CNII-XII intact without focal deficit   Assessment:  1. Other fatigue   2. Myalgia   3. Low vitamin B12 level   4. Dysuria     Plan:  Will update labs and urine culture today; ? Prolonged herpes outbreak- planning to start daily Valtrex; ? Menopausal symptoms as well- continue estrogen and prometrium; to consider increasing Effexor XR to at least 75 mg; follow up to be determined;   Time spent 30 minutes reviewing notes/ records/ discussing treatment options;  This visit occurred during the SARS-CoV-2 public health emergency.  Safety protocols were in place, including screening questions prior to the visit, additional usage of staff PPE, and extensive cleaning of exam room while observing appropriate contact time as indicated for disinfecting solutions.    No follow-ups on file.  Orders Placed This Encounter  Procedures   Urine Culture   Antinuclear Antib (ANA)   Sedimentation rate   Rheumatoid Factor   TSH   B12   CBC with Differential/Platelet   Comp Met (CMET)    Requested Prescriptions    No prescriptions requested or ordered in this encounter

## 2021-08-25 ENCOUNTER — Other Ambulatory Visit: Payer: Self-pay | Admitting: Family

## 2021-08-25 DIAGNOSIS — N951 Menopausal and female climacteric states: Secondary | ICD-10-CM

## 2021-08-25 LAB — URINE CULTURE
MICRO NUMBER:: 12868479
Result:: NO GROWTH
SPECIMEN QUALITY:: ADEQUATE

## 2021-08-25 LAB — RHEUMATOID FACTOR: Rheumatoid fact SerPl-aCnc: <14 IU/mL (ref <14)

## 2021-08-25 LAB — ANTI-NUCLEAR AB-TITER (ANA TITER): ANA Titer 1: 1:40 {titer} — ABNORMAL HIGH

## 2021-08-25 LAB — ANA: Anti Nuclear Antibody (ANA): POSITIVE — AB

## 2021-08-25 MED ORDER — VENLAFAXINE HCL ER 75 MG PO CP24: 75.0000 mg | ORAL_CAPSULE | Freq: Every day | ORAL | 0 refills | Status: DC

## 2021-08-26 ENCOUNTER — Encounter: Payer: Self-pay | Admitting: Family

## 2021-09-01 DIAGNOSIS — M9902 Segmental and somatic dysfunction of thoracic region: Secondary | ICD-10-CM | POA: Diagnosis not present

## 2021-09-01 DIAGNOSIS — M9903 Segmental and somatic dysfunction of lumbar region: Secondary | ICD-10-CM | POA: Diagnosis not present

## 2021-09-01 DIAGNOSIS — M7541 Impingement syndrome of right shoulder: Secondary | ICD-10-CM | POA: Diagnosis not present

## 2021-09-01 DIAGNOSIS — M9901 Segmental and somatic dysfunction of cervical region: Secondary | ICD-10-CM | POA: Diagnosis not present

## 2021-09-03 ENCOUNTER — Encounter: Payer: Self-pay | Admitting: Obstetrics and Gynecology

## 2021-09-04 DIAGNOSIS — M9901 Segmental and somatic dysfunction of cervical region: Secondary | ICD-10-CM | POA: Diagnosis not present

## 2021-09-04 DIAGNOSIS — M9903 Segmental and somatic dysfunction of lumbar region: Secondary | ICD-10-CM | POA: Diagnosis not present

## 2021-09-04 DIAGNOSIS — M9902 Segmental and somatic dysfunction of thoracic region: Secondary | ICD-10-CM | POA: Diagnosis not present

## 2021-09-04 DIAGNOSIS — M7541 Impingement syndrome of right shoulder: Secondary | ICD-10-CM | POA: Diagnosis not present

## 2021-09-09 DIAGNOSIS — D3131 Benign neoplasm of right choroid: Secondary | ICD-10-CM | POA: Diagnosis not present

## 2021-09-12 ENCOUNTER — Other Ambulatory Visit: Payer: Self-pay | Admitting: Family

## 2021-09-17 DIAGNOSIS — L298 Other pruritus: Secondary | ICD-10-CM | POA: Diagnosis not present

## 2021-09-17 DIAGNOSIS — L821 Other seborrheic keratosis: Secondary | ICD-10-CM | POA: Diagnosis not present

## 2021-09-17 DIAGNOSIS — L02231 Carbuncle of abdominal wall: Secondary | ICD-10-CM | POA: Diagnosis not present

## 2021-09-17 DIAGNOSIS — D2271 Melanocytic nevi of right lower limb, including hip: Secondary | ICD-10-CM | POA: Diagnosis not present

## 2021-09-17 DIAGNOSIS — D485 Neoplasm of uncertain behavior of skin: Secondary | ICD-10-CM | POA: Diagnosis not present

## 2021-09-17 DIAGNOSIS — B351 Tinea unguium: Secondary | ICD-10-CM | POA: Diagnosis not present

## 2021-09-17 DIAGNOSIS — L814 Other melanin hyperpigmentation: Secondary | ICD-10-CM | POA: Diagnosis not present

## 2021-09-17 DIAGNOSIS — L910 Hypertrophic scar: Secondary | ICD-10-CM | POA: Diagnosis not present

## 2021-09-24 ENCOUNTER — Ambulatory Visit (INDEPENDENT_AMBULATORY_CARE_PROVIDER_SITE_OTHER): Payer: BC Managed Care – PPO | Admitting: Pulmonary Disease

## 2021-09-24 ENCOUNTER — Other Ambulatory Visit: Payer: Self-pay

## 2021-09-24 ENCOUNTER — Ambulatory Visit: Payer: BC Managed Care – PPO | Admitting: Pulmonary Disease

## 2021-09-24 DIAGNOSIS — R0602 Shortness of breath: Secondary | ICD-10-CM

## 2021-09-24 LAB — PULMONARY FUNCTION TEST
DL/VA % pred: 95 %
DL/VA: 3.96 ml/min/mmHg/L
DLCO cor % pred: 80 %
DLCO cor: 19.56 ml/min/mmHg
DLCO unc % pred: 77 %
DLCO unc: 18.87 ml/min/mmHg
FEF 25-75 Post: 3.54 L/sec
FEF 25-75 Pre: 3.5 L/sec
FEF2575-%Change-Post: 1 %
FEF2575-%Pred-Post: 120 %
FEF2575-%Pred-Pre: 118 %
FEV1-%Change-Post: 3 %
FEV1-%Pred-Post: 90 %
FEV1-%Pred-Pre: 87 %
FEV1-Post: 2.9 L
FEV1-Pre: 2.8 L
FEV1FVC-%Change-Post: 0 %
FEV1FVC-%Pred-Pre: 105 %
FEV6-%Change-Post: 3 %
FEV6-%Pred-Post: 86 %
FEV6-%Pred-Pre: 83 %
FEV6-Post: 3.4 L
FEV6-Pre: 3.29 L
FEV6FVC-%Change-Post: 0 %
FEV6FVC-%Pred-Post: 102 %
FEV6FVC-%Pred-Pre: 102 %
FVC-%Change-Post: 2 %
FVC-%Pred-Post: 83 %
FVC-%Pred-Pre: 81 %
FVC-Post: 3.41 L
FVC-Pre: 3.32 L
Post FEV1/FVC ratio: 85 %
Post FEV6/FVC ratio: 100 %
Pre FEV1/FVC ratio: 85 %
Pre FEV6/FVC Ratio: 99 %
RV % pred: 83 %
RV: 1.72 L
TLC % pred: 89 %
TLC: 5.14 L

## 2021-09-24 NOTE — Progress Notes (Signed)
PFT done today. 

## 2021-09-25 ENCOUNTER — Encounter: Payer: Self-pay | Admitting: Adult Health

## 2021-09-25 ENCOUNTER — Ambulatory Visit: Payer: BC Managed Care – PPO | Admitting: Adult Health

## 2021-09-25 VITALS — BP 110/80 | HR 91

## 2021-09-25 DIAGNOSIS — E669 Obesity, unspecified: Secondary | ICD-10-CM

## 2021-09-25 DIAGNOSIS — J4530 Mild persistent asthma, uncomplicated: Secondary | ICD-10-CM | POA: Diagnosis not present

## 2021-09-25 DIAGNOSIS — J45909 Unspecified asthma, uncomplicated: Secondary | ICD-10-CM | POA: Insufficient documentation

## 2021-09-25 MED ORDER — BUDESONIDE-FORMOTEROL FUMARATE 80-4.5 MCG/ACT IN AERO: 2.0000 | INHALATION_SPRAY | Freq: Two times a day (BID) | RESPIRATORY_TRACT | 5 refills | Status: DC

## 2021-09-25 NOTE — Assessment & Plan Note (Signed)
Healthy weight loss  Referral to Healthy weight and wellness   

## 2021-09-25 NOTE — Progress Notes (Signed)
@Patient  ID: Kristie Baldwin, female    DOB: June 24, 1971, 51 y.o.   MRN: [id number]  Chief Complaint  Patient presents with   Follow-up    Referring provider: Marrian Salvage,*  HPI: 51 year old female former smoker-Minimal seen for pulmonary consult August 15, 2021 for shortness of breath for 4 months  TEST/EVENTS :  Chest x-ray May 27, 2021 showed clear lungs  09/25/2021 Follow up : Dyspnea  Patient returns for a 1 month follow-up.  Patient was seen last month for pulmonary consult for shortness of breath x 4 months.  Patient complains that she has been having increased shortness of breath with activity, decreased activity tolerance occasional wheezing.  She was seen by cardiology with echo, stress testing with no significant findings. Patient was set up for pulmonary function testing that showed normal lung function with FEV1 at 90%, ratio 85, FVC 83%, no significant bronchodilator response, DLCO 80%   Weight is 221 pounds Patient was given albuterol inhaler last visit.  Since last visit patient is feeling some better feels albuterol is helping with decreased wheezing. Using Albuterol twice daily .  Feels this all started with menopause and weight gain. Gained 40lbs 1 year ago.  Sleeps well. No snoring . No significant daytime sleepiness.      Allergies  Allergen Reactions   Monistat [Miconazole] Other (See Comments)    Vaginal burning.    Immunization History  Administered Date(s) Administered   PFIZER(Purple Top)SARS-COV-2 Vaccination 10/26/2019, 11/23/2019   Tdap 08/27/2011    Past Medical History:  Diagnosis Date   Abnormal Pap smear of cervix    Depression    Dysmenorrhea    Peptic ulcer    STD (sexually transmitted disease)    HSV    Tobacco History: Social History   Tobacco Use  Smoking Status Former   Packs/day: 0.25   Years: 0.00   Pack years: 0.00   Types: Cigarettes   Quit date: 08/11/1991   Years since quitting: 30.1  Smokeless  Tobacco Never   Counseling given: Not Answered   Outpatient Medications Prior to Visit  Medication Sig Dispense Refill   albuterol (VENTOLIN HFA) 108 (90 Base) MCG/ACT inhaler Inhale 2 puffs into the lungs every 6 (six) hours as needed for wheezing or shortness of breath. 8 g 3   Ascorbic Acid (VITAMIN C) 1000 MG tablet Take 1,000 mg by mouth daily.     BIOTIN PO Take by mouth.     Calcium Carb-Cholecalciferol (CALCIUM 1000 + D PO) Take by mouth.     Cholecalciferol (VITAMIN D3 PO) Take by mouth.     COLLAGEN PO Take 1 tablet by mouth daily.     Cyanocobalamin (B-12 PO) Take by mouth.     estradiol (VIVELLE-DOT) 0.05 MG/24HR patch Place 1 patch (0.05 mg total) onto the skin 2 (two) times a week. 24 patch 3   ibuprofen (ADVIL) 800 MG tablet Take 1 tablet (800 mg total) by mouth every 8 (eight) hours as needed. 30 tablet 1   pantoprazole (PROTONIX) 40 MG tablet TAKE 1 TABLET(40 MG) BY MOUTH TWICE DAILY BEFORE A MEAL 180 tablet 1   progesterone (PROMETRIUM) 200 MG capsule TAKE 1 CAPSULE(200 MG) BY MOUTH AT BEDTIME. 90 capsule 3   valACYclovir (VALTREX) 500 MG tablet Take one tablet (500 mg) by mouth daily.   Take one tablet (500 mg) by mouth twice a day for 3 days as needed for an outbreak. 110 tablet 3   venlafaxine XR (EFFEXOR-XR) 37.5 MG  24 hr capsule Take 37.5 mg by mouth daily with breakfast.     sulfamethoxazole-trimethoprim (BACTRIM DS) 800-160 MG tablet Take 1 tablet by mouth 2 (two) times daily. One PO BID x 7 days (Patient not taking: Reported on 08/22/2021) 14 tablet 0   venlafaxine XR (EFFEXOR XR) 75 MG 24 hr capsule Take 1 capsule (75 mg total) by mouth daily with breakfast. 90 capsule 0   No facility-administered medications prior to visit.     Review of Systems:   Constitutional:   No  weight loss, night sweats,  Fevers, chills,  +fatigue, or  lassitude.  HEENT:   No headaches,  Difficulty swallowing,  Tooth/dental problems, or  Sore throat,                No sneezing,  itching, ear ache, nasal congestion, post nasal drip,   CV:  No chest pain,  Orthopnea, PND, swelling in lower extremities, anasarca, dizziness, palpitations, syncope.   GI  No heartburn, indigestion, abdominal pain, nausea, vomiting, diarrhea, change in bowel habits, loss of appetite, bloody stools.   Resp:   No chest wall deformity  Skin: no rash or lesions.  GU: no dysuria, change in color of urine, no urgency or frequency.  No flank pain, no hematuria   MS:  No joint pain or swelling.  No decreased range of motion.  No back pain.    Physical Exam  BP 110/80 (BP Location: Left Arm, Cuff Size: Large)    Pulse 91    LMP 10/05/2019 (Exact Date) Comment: 09-30-2020 bleeding   SpO2 97%   GEN: A/Ox3; pleasant , NAD, well nourished    HEENT:  [place]/AT,  NOSE-clear, THROAT-clear, no lesions, no postnasal drip or exudate noted.   NECK:  Supple w/ fair ROM; no JVD; normal carotid impulses w/o bruits; no thyromegaly or nodules palpated; no lymphadenopathy.    RESP  Clear  P & A; w/o, wheezes/ rales/ or rhonchi. no accessory muscle use, no dullness to percussion  CARD:  RRR, no m/r/g, no peripheral edema, pulses intact, no cyanosis or clubbing.  GI:   Soft & nt; nml bowel sounds; no organomegaly or masses detected.   Musco: Warm bil, no deformities or joint swelling noted.   Neuro: alert, no focal deficits noted.    Skin: Warm, no lesions or rashes    Lab Results:  CBC    Component Value Date/Time   WBC 7.3 08/22/2021 0916   RBC 4.54 08/22/2021 0916   HGB 12.3 08/22/2021 0916   HGB 12.7 09/16/2015 0849   HCT 38.3 08/22/2021 0916   PLT 300.0 08/22/2021 0916   MCV 84.4 08/22/2021 0916   MCV 87.3 11/13/2013 2123   MCH 27.4 05/02/2021 1417   MCHC 32.1 08/22/2021 0916   RDW 13.6 08/22/2021 0916   LYMPHSABS 1.3 08/22/2021 0916   MONOABS 0.4 08/22/2021 0916   EOSABS 0.2 08/22/2021 0916   BASOSABS 0.1 08/22/2021 0916    BMET    Component Value Date/Time   NA 139 08/22/2021  0916   NA 138 11/12/2016 0000   K 4.2 08/22/2021 0916   CL 103 08/22/2021 0916   CO2 29 08/22/2021 0916   GLUCOSE 76 08/22/2021 0916   BUN 14 08/22/2021 0916   BUN 14 11/12/2016 0000   CREATININE 1.06 08/22/2021 0916   CREATININE 1.08 (H) 05/02/2021 1417   CALCIUM 9.2 08/22/2021 0916    BNP No results found for: BNP  ProBNP    Component Value Date/Time  PROBNP 19.0 07/29/2021 0745    Imaging: No results found.    PFT Results Latest Ref Rng & Units 09/24/2021  FVC-Pre L 3.32  FVC-Predicted Pre % 81  FVC-Post L 3.41  FVC-Predicted Post % 83  Pre FEV1/FVC % % 85  Post FEV1/FCV % % 85  FEV1-Pre L 2.80  FEV1-Predicted Pre % 87  FEV1-Post L 2.90  DLCO uncorrected ml/min/mmHg 18.87  DLCO UNC% % 77  DLCO corrected ml/min/mmHg 19.56  DLCO COR %Predicted % 80  DLVA Predicted % 95  TLC L 5.14  TLC % Predicted % 89  RV % Predicted % 83    No results found for: NITRICOXIDE      Assessment & Plan:   Asthma Shortness of breath and wheezing with improvement with albuterol.  May have a component of some mild intermittent/persistent asthma- will try Symbicort   Plan  Patient Instructions  Begin Symbicort 80/4.71mcg 2 puffs Twice daily , rinse after use.  Albuterol inhaler As needed  -this is your rescue inhaler.  Activity/exercise as tolerated .   Refer to healthy weight and wellness .   Follow up with Dr. Hermina Staggers or Myrella Fahs NP and As needed        Obesity Healthy weight loss  Referral to Healthy weight and wellness      Rexene Edison, NP 09/25/2021

## 2021-09-25 NOTE — Patient Instructions (Signed)
Begin Symbicort 80/4.59mcg 2 puffs Twice daily , rinse after use.  Albuterol inhaler As needed  -this is your rescue inhaler.  Activity/exercise as tolerated .   Refer to healthy weight and wellness .   Follow up with Dr. Melida Quitter or Onica Davidovich NP and As needed

## 2021-09-25 NOTE — Assessment & Plan Note (Addendum)
Shortness of breath and wheezing with improvement with albuterol.  May have a component of some mild intermittent/persistent asthma- will try Symbicort   Plan  Patient Instructions  Begin Symbicort 80/4.69mcg 2 puffs Twice daily , rinse after use.  Albuterol inhaler As needed  -this is your rescue inhaler.  Activity/exercise as tolerated .   Refer to healthy weight and wellness .   Follow up with Dr. Melida Quitter or Sariya Trickey NP and As needed

## 2021-10-01 DIAGNOSIS — M9902 Segmental and somatic dysfunction of thoracic region: Secondary | ICD-10-CM | POA: Diagnosis not present

## 2021-10-01 DIAGNOSIS — M9903 Segmental and somatic dysfunction of lumbar region: Secondary | ICD-10-CM | POA: Diagnosis not present

## 2021-10-01 DIAGNOSIS — M9901 Segmental and somatic dysfunction of cervical region: Secondary | ICD-10-CM | POA: Diagnosis not present

## 2021-10-01 DIAGNOSIS — M7541 Impingement syndrome of right shoulder: Secondary | ICD-10-CM | POA: Diagnosis not present

## 2021-10-09 DIAGNOSIS — M9902 Segmental and somatic dysfunction of thoracic region: Secondary | ICD-10-CM | POA: Diagnosis not present

## 2021-10-09 DIAGNOSIS — M9903 Segmental and somatic dysfunction of lumbar region: Secondary | ICD-10-CM | POA: Diagnosis not present

## 2021-10-09 DIAGNOSIS — M9901 Segmental and somatic dysfunction of cervical region: Secondary | ICD-10-CM | POA: Diagnosis not present

## 2021-10-09 DIAGNOSIS — M7541 Impingement syndrome of right shoulder: Secondary | ICD-10-CM | POA: Diagnosis not present

## 2021-10-17 ENCOUNTER — Other Ambulatory Visit: Payer: Self-pay | Admitting: Obstetrics and Gynecology

## 2021-10-17 DIAGNOSIS — M9901 Segmental and somatic dysfunction of cervical region: Secondary | ICD-10-CM | POA: Diagnosis not present

## 2021-10-17 DIAGNOSIS — M9903 Segmental and somatic dysfunction of lumbar region: Secondary | ICD-10-CM | POA: Diagnosis not present

## 2021-10-17 DIAGNOSIS — M9902 Segmental and somatic dysfunction of thoracic region: Secondary | ICD-10-CM | POA: Diagnosis not present

## 2021-10-17 DIAGNOSIS — Z1231 Encounter for screening mammogram for malignant neoplasm of breast: Secondary | ICD-10-CM

## 2021-10-17 DIAGNOSIS — M7541 Impingement syndrome of right shoulder: Secondary | ICD-10-CM | POA: Diagnosis not present

## 2021-10-20 ENCOUNTER — Encounter: Payer: Self-pay | Admitting: Family Medicine

## 2021-10-20 ENCOUNTER — Ambulatory Visit: Payer: BC Managed Care – PPO | Admitting: Family Medicine

## 2021-10-20 VITALS — BP 120/80 | HR 108 | Temp 98.0°F | Ht 68.0 in | Wt 217.0 lb

## 2021-10-20 DIAGNOSIS — J040 Acute laryngitis: Secondary | ICD-10-CM

## 2021-10-20 MED ORDER — FLUCONAZOLE 150 MG PO TABS: ORAL_TABLET | ORAL | 0 refills | Status: DC

## 2021-10-20 MED ORDER — AMOXICILLIN 500 MG PO CAPS: 1000.0000 mg | ORAL_CAPSULE | Freq: Every day | ORAL | 0 refills | Status: AC

## 2021-10-20 MED ORDER — PREDNISONE 20 MG PO TABS: 40.0000 mg | ORAL_TABLET | Freq: Every day | ORAL | 0 refills | Status: AC

## 2021-10-20 NOTE — Progress Notes (Signed)
Chief Complaint  ?Patient presents with  ? Laryngitis  ?  Sore throat ?Cough ?Headache ?Fatigue ?Sick since Saturday ?Covid test Negative  ? ? ?Kristie Baldwin here for URI complaints. ? ?Duration: 2 days  ?Associated symptoms: sinus headache, ear fullness, sore throat, wheezing, loss of voice, fatigue, and coughing ?Denies: sinus congestion, sinus pain, rhinorrhea, itchy watery eyes, ear pain, ear drainage, shortness of breath, myalgia, and N/V/D.  ?Treatment to date: SABA, ibuprofen ?Sick contacts: No ?Tested - for covid.  ? ?Past Medical History:  ?Diagnosis Date  ? Abnormal Pap smear of cervix   ? Depression   ? Dysmenorrhea   ? Peptic ulcer   ? STD (sexually transmitted disease)   ? HSV  ? ? ?Objective ?BP 120/80   Pulse (!) 108   Temp 98 ?F (36.7 ?C) (Oral)   Ht 5\' 8"  (1.727 m)   Wt 217 lb (98.4 kg)   LMP 10/05/2019 (Exact Date) Comment: 09-30-2020 bleeding  SpO2 99%   BMI 32.99 kg/m?  ?General: Awake, alert, appears stated age ?HEENT: AT, [place], ears patent b/l and TM neg on L, bulging w/o erythema or fluid on R, nares patent w/o discharge, pharynx erythematous and without exudates, MMM ?Neck: No masses or asymmetry ?Heart: RRR ?Lungs: CTAB, no accessory muscle use ?Psych: Age appropriate judgment and insight, normal mood and affect ? ?Laryngitis - Plan: amoxicillin (AMOXIL) 500 MG capsule, predniSONE (DELTASONE) 20 MG tablet, fluconazole (DIFLUCAN) 150 MG tablet ? ?5 d pred burst 40 mg/d for throat and ear. If no better in 2 d, will have her take 10 d course of amox. Continue to push fluids, practice good hand hygiene, cover mouth when coughing. ?F/u prn. If starting to experience fevers, shaking, or shortness of breath, seek immediate care. ?Pt voiced understanding and agreement to the plan. ? ?10-02-2020, DO ?10/20/21 ?1:28 PM ? ?

## 2021-10-20 NOTE — Patient Instructions (Addendum)
Continue to push fluids, practice good hand hygiene, and cover your mouth if you cough. ? ?If you start having fevers, shaking or shortness of breath, seek immediate care. ? ?Consider throat lozenges, salt water gargles and an air humidifier for symptomatic care.  ? ?Wait a couple days before taking the amoxicillin. Throw out your toothbrush/replace it after 24 hrs of being on antibiotic.  ? ?Let us know if you need anything. ?

## 2021-10-23 DIAGNOSIS — M7541 Impingement syndrome of right shoulder: Secondary | ICD-10-CM | POA: Diagnosis not present

## 2021-10-23 DIAGNOSIS — M9901 Segmental and somatic dysfunction of cervical region: Secondary | ICD-10-CM | POA: Diagnosis not present

## 2021-10-23 DIAGNOSIS — M9903 Segmental and somatic dysfunction of lumbar region: Secondary | ICD-10-CM | POA: Diagnosis not present

## 2021-10-23 DIAGNOSIS — M9902 Segmental and somatic dysfunction of thoracic region: Secondary | ICD-10-CM | POA: Diagnosis not present

## 2021-10-31 DIAGNOSIS — M9901 Segmental and somatic dysfunction of cervical region: Secondary | ICD-10-CM | POA: Diagnosis not present

## 2021-10-31 DIAGNOSIS — M7541 Impingement syndrome of right shoulder: Secondary | ICD-10-CM | POA: Diagnosis not present

## 2021-10-31 DIAGNOSIS — M9902 Segmental and somatic dysfunction of thoracic region: Secondary | ICD-10-CM | POA: Diagnosis not present

## 2021-10-31 DIAGNOSIS — M9903 Segmental and somatic dysfunction of lumbar region: Secondary | ICD-10-CM | POA: Diagnosis not present

## 2021-11-04 ENCOUNTER — Ambulatory Visit: Payer: BC Managed Care – PPO | Admitting: Family

## 2021-11-04 ENCOUNTER — Telehealth: Payer: Self-pay

## 2021-11-04 ENCOUNTER — Encounter: Payer: Self-pay | Admitting: Family

## 2021-11-04 VITALS — BP 110/60 | HR 87 | Temp 97.4°F | Resp 18 | Ht 68.0 in | Wt 220.2 lb

## 2021-11-04 DIAGNOSIS — J309 Allergic rhinitis, unspecified: Secondary | ICD-10-CM | POA: Diagnosis not present

## 2021-11-04 DIAGNOSIS — E669 Obesity, unspecified: Secondary | ICD-10-CM

## 2021-11-04 MED ORDER — WEGOVY 0.25 MG/0.5ML ~~LOC~~ SOAJ: 0.2500 mg | SUBCUTANEOUS | 0 refills | Status: DC

## 2021-11-04 MED ORDER — FLUTICASONE PROPIONATE 50 MCG/ACT NA SUSP: 2.0000 | Freq: Every day | NASAL | 6 refills | Status: DC

## 2021-11-04 NOTE — Progress Notes (Signed)
?Kristie Baldwin is a 51 y.o. female with the following history as recorded in EpicCare:  ?Patient Active Problem List  ? Diagnosis Date Noted  ? Asthma 09/25/2021  ? Weight loss counseling, encounter for 12/17/2017  ? Obesity 06/04/2017  ? NAFLD (nonalcoholic fatty liver disease) 75/05/2584  ?  ?Current Outpatient Medications  ?Medication Sig Dispense Refill  ? albuterol (VENTOLIN HFA) 108 (90 Base) MCG/ACT inhaler Inhale 2 puffs into the lungs every 6 (six) hours as needed for wheezing or shortness of breath. 8 g 3  ? Ascorbic Acid (VITAMIN C) 1000 MG tablet Take 1,000 mg by mouth daily.    ? BIOTIN PO Take by mouth.    ? budesonide-formoterol (SYMBICORT) 80-4.5 MCG/ACT inhaler Inhale 2 puffs into the lungs 2 (two) times daily. 1 each 5  ? Calcium Carb-Cholecalciferol (CALCIUM 1000 + D PO) Take by mouth.    ? Cholecalciferol (VITAMIN D3 PO) Take by mouth.    ? COLLAGEN PO Take 1 tablet by mouth daily.    ? Cyanocobalamin (B-12 PO) Take by mouth.    ? estradiol (VIVELLE-DOT) 0.05 MG/24HR patch Place 1 patch (0.05 mg total) onto the skin 2 (two) times a week. 24 patch 3  ? fluticasone (FLONASE) 50 MCG/ACT nasal spray Place 2 sprays into both nostrils daily. 16 g 6  ? ibuprofen (ADVIL) 800 MG tablet Take 1 tablet (800 mg total) by mouth every 8 (eight) hours as needed. 30 tablet 1  ? pantoprazole (PROTONIX) 40 MG tablet TAKE 1 TABLET(40 MG) BY MOUTH TWICE DAILY BEFORE A MEAL 180 tablet 1  ? progesterone (PROMETRIUM) 200 MG capsule TAKE 1 CAPSULE(200 MG) BY MOUTH AT BEDTIME. 90 capsule 3  ? Semaglutide-Weight Management (WEGOVY) 0.25 MG/0.5ML SOAJ Inject 0.25 mg into the skin once a week. 2 mL 0  ? valACYclovir (VALTREX) 500 MG tablet Take one tablet (500 mg) by mouth daily.   Take one tablet (500 mg) by mouth twice a day for 3 days as needed for an outbreak. 110 tablet 3  ? venlafaxine XR (EFFEXOR-XR) 37.5 MG 24 hr capsule Take 37.5 mg by mouth daily with breakfast.    ? fluconazole (DIFLUCAN) 150 MG tablet Take  1 tab, repeat in 72 hours if no improvement. (Patient not taking: Reported on 11/04/2021) 2 tablet 0  ? ?No current facility-administered medications for this visit.  ?  ?Allergies: Monistat [miconazole]  ?Past Medical History:  ?Diagnosis Date  ? Abnormal Pap smear of cervix   ? Depression   ? Dysmenorrhea   ? Peptic ulcer   ? STD (sexually transmitted disease)   ? HSV  ?  ?No past surgical history on file.  ?Family History  ?Problem Relation Age of Onset  ? Hypertension Mother   ? Arthritis Mother   ?     OA  ? Atrial fibrillation Mother   ?     medication  ? Heart Problems Father   ?     pace maker 2018  ? Prostate cancer Father   ? Atrial fibrillation Father   ? Breast cancer Neg Hx   ? Colon cancer Neg Hx   ? Stomach cancer Neg Hx   ? Pancreatic cancer Neg Hx   ?  ?Social History  ? ?Tobacco Use  ? Smoking status: Former  ?  Packs/day: 0.25  ?  Years: 0.00  ?  Pack years: 0.00  ?  Types: Cigarettes  ?  Quit date: 08/11/1991  ?  Years since quitting: 30.2  ?  Smokeless tobacco: Never  ?Substance Use Topics  ? Alcohol use: Yes  ?  Alcohol/week: 2.0 standard drinks  ?  Types: 2 Standard drinks or equivalent per week  ?  Comment: occasionally  ?  ?Subjective:  ? ?Would like to discuss weight loss options; has been referred to [facility] but is on extended waiting list; limited response to Phentermine in the past;  ? ?Also concerned that still feeling congested/ sore throat since treatment for laryngitis in mid-March;  ? ? ? ?Objective:  ?Vitals:  ? 11/04/21 0934  ?BP: 110/60  ?Pulse: 87  ?Resp: 18  ?Temp: (!) 97.4 ?F (36.3 ?C)  ?TempSrc: Oral  ?SpO2: 97%  ?Weight: 220 lb 3.2 oz (99.9 kg)  ?Height: 5\' 8"  (1.727 m)  ?  ?General: Well developed, well nourished, in no acute distress  ?Skin : Warm and dry.  ?Head: Normocephalic and atraumatic  ?Eyes: Sclera and conjunctiva clear; pupils round and reactive to light; extraocular movements intact  ?Ears: External normal; canals clear; tympanic membranes  normal  ?Oropharynx: Pink, supple. No suspicious lesions  ?Neck: Supple without thyromegaly, adenopathy  ?Lungs: Respirations unlabored; clear to auscultation bilaterally without wheeze, rales, rhonchi  ?CVS exam: normal rate and regular rhythm.  ?Neurologic: Alert and oriented; speech intact; face symmetrical; moves all extremities well; CNII-XII intact without focal deficit  ? ?Assessment:  ?1. Allergic rhinitis, unspecified seasonality, unspecified trigger   ?2. Obesity (BMI 30.0-34.9)   ?  ?Plan:  ?Reassurance that no further antibiotics needed; trial of Flonase NS; increase fluids, rest and follow up worse, no better; ?Will start patient on Wegovy; risks and benefits discussed; she understands that she will need to call back in 4 weeks with response to discuss dose adjustment; will need in office visit in 3 months;  ? ?This visit occurred during the SARS-CoV-2 public health emergency.  Safety protocols were in place, including screening questions prior to the visit, additional usage of staff PPE, and extensive cleaning of exam room while observing appropriate contact time as indicated for disinfecting solutions.  ? ? ?No follow-ups on file.  ?No orders of the defined types were placed in this encounter. ?  ?Requested Prescriptions  ? ?Signed Prescriptions Disp Refills  ? Semaglutide-Weight Management (WEGOVY) 0.25 MG/0.5ML SOAJ 2 mL 0  ?  Sig: Inject 0.25 mg into the skin once a week.  ? fluticasone (FLONASE) 50 MCG/ACT nasal spray 16 g 6  ?  Sig: Place 2 sprays into both nostrils daily.  ?  ? ?

## 2021-11-04 NOTE — Telephone Encounter (Signed)
Called pt and notified her of the information and she stated that she is already on a waiting list for weight and wellness and then hung up the phone.  ?

## 2021-11-04 NOTE — Telephone Encounter (Signed)
PA denied, not a covered benefit. Pt's plan does not cover weight loss medications.  ?

## 2021-11-04 NOTE — Telephone Encounter (Signed)
PA initiated via Covermymeds; KEY: [id number]. Awaiting determination.  ?

## 2021-11-04 NOTE — Telephone Encounter (Signed)
PA denied. Awaiting denial information.  °

## 2021-11-06 ENCOUNTER — Ambulatory Visit: Payer: BC Managed Care – PPO | Admitting: Family

## 2021-11-06 DIAGNOSIS — M9903 Segmental and somatic dysfunction of lumbar region: Secondary | ICD-10-CM | POA: Diagnosis not present

## 2021-11-06 DIAGNOSIS — M7541 Impingement syndrome of right shoulder: Secondary | ICD-10-CM | POA: Diagnosis not present

## 2021-11-06 DIAGNOSIS — M9901 Segmental and somatic dysfunction of cervical region: Secondary | ICD-10-CM | POA: Diagnosis not present

## 2021-11-06 DIAGNOSIS — M9902 Segmental and somatic dysfunction of thoracic region: Secondary | ICD-10-CM | POA: Diagnosis not present

## 2021-11-13 ENCOUNTER — Ambulatory Visit: Payer: BC Managed Care – PPO | Admitting: Cardiology

## 2021-11-18 ENCOUNTER — Encounter: Payer: Self-pay | Admitting: Family

## 2021-11-18 ENCOUNTER — Encounter: Payer: Self-pay | Admitting: Obstetrics and Gynecology

## 2021-11-18 ENCOUNTER — Other Ambulatory Visit: Payer: Self-pay | Admitting: Family

## 2021-11-18 DIAGNOSIS — R635 Abnormal weight gain: Secondary | ICD-10-CM

## 2021-11-18 DIAGNOSIS — M791 Myalgia, unspecified site: Secondary | ICD-10-CM

## 2021-11-18 DIAGNOSIS — R899 Unspecified abnormal finding in specimens from other organs, systems and tissues: Secondary | ICD-10-CM

## 2021-11-18 NOTE — Telephone Encounter (Signed)
Please schedule an office visit.

## 2021-11-19 NOTE — Progress Notes (Signed)
GYNECOLOGY  VISIT ?  ?HPI: ?51 y.o.   Married  Caucasian  female   ?[id number] with Patient's last menstrual period was 10/05/2019 (exact date).   ?here for consult to discuss fatigue, night sweats and weight gain. ? ?States the fatigue is "insane." ?Positive ANA.  ? ?Not sleeping all night.  ?Wakes up sweating but not having night sweats as she has in the past.  ?Wakes up in the middle of the night. ?Not having hot flashes during the day.  ? ?Feels like something is off.  ?Has bouts of feeling sad and cries easily.  ?Feels depressed only about how she looks.  ?Denies suicidal ideation.  ? ?States increased 3 pants sizes in the last 6 months.  ?Her PCP prescribed Wegovy, but patient did not take due to cost.  ?Feels bloated all the time.  ? ?Is walking.  ? ?Patient is on Effexor 37.5 mg.  ?Her PCP tried to increase it but this was not done.  ?When she increased the dosage in the past, she did not feel well. ? ?She will do blood work with her PCP today. ? ?GYNECOLOGIC HISTORY: ?Patient's last menstrual period was 10/05/2019 (exact date). ?Contraception:  PMP ?Menopausal hormone therapy: Vivelle Dot 0.05mg , Progesterone 200mg  ?Last mammogram:  11-19-20 Neg/BiRads1--Appt. 11-20-21 ?Last pap smear:    11-12-20 Neg:Neg HR HPV, 09-16-15 Neg:Neg HR HPV, 2014 Neg ?       ?OB History   ? ? Gravida  ?1  ? Para  ?0  ? Term  ?0  ? Preterm  ?0  ? AB  ?1  ? Living  ?0  ?  ? ? SAB  ?0  ? IAB  ?1  ? Ectopic  ?0  ? Multiple  ?0  ? Live Births  ?0  ?   ?  ?  ?    ? ?Patient Active Problem List  ? Diagnosis Date Noted  ? Asthma 09/25/2021  ? Weight loss counseling, encounter for 12/17/2017  ? Obesity 06/04/2017  ? NAFLD (nonalcoholic fatty liver disease) 12/15/2016  ? ? ?Past Medical History:  ?Diagnosis Date  ? Abnormal Pap smear of cervix   ? Depression   ? Dysmenorrhea   ? Peptic ulcer   ? STD (sexually transmitted disease)   ? HSV  ? ? ?History reviewed. No pertinent surgical history. ? ?Current Outpatient Medications  ?Medication Sig  Dispense Refill  ? albuterol (VENTOLIN HFA) 108 (90 Base) MCG/ACT inhaler Inhale 2 puffs into the lungs every 6 (six) hours as needed for wheezing or shortness of breath. 8 g 3  ? Calcium Carb-Cholecalciferol (CALCIUM 1000 + D PO) Take by mouth.    ? Cholecalciferol (VITAMIN D3 PO) Take by mouth.    ? clindamycin (CLEOCIN T) 1 % external solution SMARTSIG:Topical 1-2 Times Daily PRN    ? COLLAGEN PO Take 1 tablet by mouth daily.    ? Cyanocobalamin (B-12 PO) Take by mouth.    ? estradiol (VIVELLE-DOT) 0.05 MG/24HR patch Place 1 patch (0.05 mg total) onto the skin 2 (two) times a week. 24 patch 3  ? ibuprofen (ADVIL) 800 MG tablet Take 1 tablet (800 mg total) by mouth every 8 (eight) hours as needed. 30 tablet 1  ? JUBLIA 10 % SOLN Apply topically at bedtime.    ? pantoprazole (PROTONIX) 40 MG tablet TAKE 1 TABLET(40 MG) BY MOUTH TWICE DAILY BEFORE A MEAL 180 tablet 1  ? progesterone (PROMETRIUM) 200 MG capsule TAKE 1 CAPSULE(200 MG) BY MOUTH AT  BEDTIME. 90 capsule 3  ? valACYclovir (VALTREX) 500 MG tablet Take one tablet (500 mg) by mouth daily.   Take one tablet (500 mg) by mouth twice a day for 3 days as needed for an outbreak. 110 tablet 3  ? venlafaxine XR (EFFEXOR-XR) 37.5 MG 24 hr capsule Take 37.5 mg by mouth daily with breakfast.    ? ?No current facility-administered medications for this visit.  ?  ? ?ALLERGIES: Monistat [miconazole] ? ?Family History  ?Problem Relation Age of Onset  ? Hypertension Mother   ? Arthritis Mother   ?     OA  ? Atrial fibrillation Mother   ?     medication  ? Heart Problems Father   ?     pace maker 2018  ? Prostate cancer Father   ? Atrial fibrillation Father   ? Breast cancer Neg Hx   ? Colon cancer Neg Hx   ? Stomach cancer Neg Hx   ? Pancreatic cancer Neg Hx   ? ? ?Social History  ? ?Socioeconomic History  ? Marital status: Married  ?  Spouse name: Not on file  ? Number of children: 0  ? Years of education: Not on file  ? Highest education level: Not on file  ?Occupational  History  ?  Employer: [employer]  ?Tobacco Use  ? Smoking status: Former  ?  Packs/day: 0.25  ?  Years: 0.00  ?  Pack years: 0.00  ?  Types: Cigarettes  ?  Quit date: 08/11/1991  ?  Years since quitting: 30.2  ? Smokeless tobacco: Never  ?Vaping Use  ? Vaping Use: Never used  ?Substance and Sexual Activity  ? Alcohol use: Yes  ?  Alcohol/week: 2.0 standard drinks  ?  Types: 2 Standard drinks or equivalent per week  ?  Comment: occasionally  ? Drug use: No  ? Sexual activity: Not Currently  ?  Partners: Male  ?  Birth control/protection: Post-menopausal  ?Other Topics Concern  ? Not on file  ?Social History Narrative  ? Not on file  ? ?Social Determinants of Health  ? ?Financial Resource Strain: Not on file  ?Food Insecurity: Not on file  ?Transportation Needs: Not on file  ?Physical Activity: Not on file  ?Stress: Not on file  ?Social Connections: Not on file  ?Intimate Partner Violence: Not on file  ? ? ?Review of Systems  ?Constitutional:  Positive for fatigue and unexpected weight change (weight gain per patient).  ?Endocrine:  ?     Night flashes  ? ?PHYSICAL EXAMINATION:   ? ?BP 114/78   Pulse 96   Ht 5' 7.5" (1.715 m)   Wt 220 lb (99.8 kg)   LMP 10/05/2019 (Exact Date) Comment: 09-30-2020 bleeding  SpO2 99%   BMI 33.95 kg/m?     ?General appearance: alert, cooperative and appears stated age ?  ?ASSESSMENT ? ?Menopausal symptoms.  ?Weight gain.  ?BMI 33.95. ? ?PLAN ? ?Will increased Vivelle Dot to 0.75 mg twice daily.  ?Continue Prometrium 200 mg q hs. ?Continue Effexor 37.5 mg daily.  ?Referral to healthy [facility] Management at [facility], Dr. Briscoe Deutscher.  ?She will do her labs today through PCP.  This does include thyroid function.  ?FU for annual exam and prn.  ?  ?An After Visit Summary was printed and given to the patient. ? ?22 min  total time was spent for this patient encounter, including preparation, face-to-face counseling with the patient, coordination of care, and documentation of the  encounter. ? ? ?

## 2021-11-20 ENCOUNTER — Telehealth: Payer: Self-pay | Admitting: Obstetrics and Gynecology

## 2021-11-20 ENCOUNTER — Other Ambulatory Visit: Payer: BC Managed Care – PPO

## 2021-11-20 ENCOUNTER — Ambulatory Visit
Admission: RE | Admit: 2021-11-20 | Discharge: 2021-11-20 | Disposition: A | Discharge status: Home / Self Care | Payer: BC Managed Care – PPO | Source: Ambulatory Visit | Attending: Obstetrics and Gynecology | Admitting: Obstetrics and Gynecology

## 2021-11-20 ENCOUNTER — Ambulatory Visit: Payer: BC Managed Care – PPO | Admitting: Obstetrics and Gynecology

## 2021-11-20 ENCOUNTER — Encounter: Payer: Self-pay | Admitting: Obstetrics and Gynecology

## 2021-11-20 VITALS — BP 114/78 | HR 96 | Ht 67.5 in | Wt 220.0 lb

## 2021-11-20 DIAGNOSIS — R638 Other symptoms and signs concerning food and fluid intake: Secondary | ICD-10-CM

## 2021-11-20 DIAGNOSIS — M791 Myalgia, unspecified site: Secondary | ICD-10-CM

## 2021-11-20 DIAGNOSIS — Z1231 Encounter for screening mammogram for malignant neoplasm of breast: Secondary | ICD-10-CM | POA: Diagnosis not present

## 2021-11-20 DIAGNOSIS — R899 Unspecified abnormal finding in specimens from other organs, systems and tissues: Secondary | ICD-10-CM | POA: Diagnosis not present

## 2021-11-20 DIAGNOSIS — R799 Abnormal finding of blood chemistry, unspecified: Secondary | ICD-10-CM | POA: Diagnosis not present

## 2021-11-20 DIAGNOSIS — R635 Abnormal weight gain: Secondary | ICD-10-CM

## 2021-11-20 DIAGNOSIS — N951 Menopausal and female climacteric states: Secondary | ICD-10-CM

## 2021-11-20 MED ORDER — ESTRADIOL 0.075 MG/24HR TD PTTW: 1.0000 | MEDICATED_PATCH | TRANSDERMAL | 8 refills | Status: DC

## 2021-11-20 NOTE — Telephone Encounter (Signed)
spoke with Dr Alcario Drought [facility] referral coordinator, [facility], at her new office at [facility]. ? ?Patient has BCBS and they are still trying to work out the details and get details finalized to be able to accept patients with BCBS. ? ?She took our patient's name and phone # and will call them to explain the situation. She thinks it will likely be a few weeks before she can schedule patients with [employer].  ? ?Referral will be faxed to her at [phone]. ? ?I will hold encounter and follow up in 3 weeks to see if patient scheduled. ?

## 2021-11-20 NOTE — Telephone Encounter (Signed)
Please make a referral to Dr. Helane Rima at [facility]. ? ?My patient is experiencing weight gain.  ?BMI 33.95.  ?

## 2021-11-20 NOTE — Telephone Encounter (Signed)
Thank you for the update!

## 2021-11-20 NOTE — Telephone Encounter (Addendum)
Referral order placed in Epic. Dr. Helane Rima is at [facility] and [facility]. ? ?I just learned today is her last day with [facility]. ? ?I received her referral coordinator's phone number. ?

## 2021-11-23 LAB — THYROID PANEL WITH TSH
Free Thyroxine Index: 2.5 (ref 1.4–3.8)
T3 Uptake: 32 % (ref 22–35)
T4, Total: 7.7 ug/dL (ref 5.1–11.9)
TSH: 1.58 mIU/L

## 2021-11-23 LAB — ANTI-NUCLEAR AB-TITER (ANA TITER)
ANA TITER: 1:40 {titer} — ABNORMAL HIGH
ANA Titer 1: 1:80 {titer} — ABNORMAL HIGH

## 2021-11-23 LAB — ANA: Anti Nuclear Antibody (ANA): POSITIVE — AB

## 2021-11-23 LAB — PROLACTIN: Prolactin: 9 ng/mL

## 2021-11-24 ENCOUNTER — Other Ambulatory Visit: Payer: Self-pay | Admitting: Family

## 2021-11-24 DIAGNOSIS — R768 Other specified abnormal immunological findings in serum: Secondary | ICD-10-CM

## 2021-11-24 DIAGNOSIS — M791 Myalgia, unspecified site: Secondary | ICD-10-CM

## 2021-11-26 ENCOUNTER — Encounter: Payer: Self-pay | Admitting: Cardiology

## 2021-11-26 NOTE — Telephone Encounter (Signed)
From patient.

## 2021-12-02 ENCOUNTER — Ambulatory Visit: Payer: BC Managed Care – PPO | Admitting: Cardiology

## 2021-12-02 ENCOUNTER — Other Ambulatory Visit: Payer: Self-pay | Admitting: Family

## 2021-12-02 DIAGNOSIS — N951 Menopausal and female climacteric states: Secondary | ICD-10-CM

## 2021-12-09 DIAGNOSIS — M9903 Segmental and somatic dysfunction of lumbar region: Secondary | ICD-10-CM | POA: Diagnosis not present

## 2021-12-09 DIAGNOSIS — M9902 Segmental and somatic dysfunction of thoracic region: Secondary | ICD-10-CM | POA: Diagnosis not present

## 2021-12-09 DIAGNOSIS — M9905 Segmental and somatic dysfunction of pelvic region: Secondary | ICD-10-CM | POA: Diagnosis not present

## 2021-12-09 DIAGNOSIS — M9901 Segmental and somatic dysfunction of cervical region: Secondary | ICD-10-CM | POA: Diagnosis not present

## 2021-12-15 DIAGNOSIS — M9903 Segmental and somatic dysfunction of lumbar region: Secondary | ICD-10-CM | POA: Diagnosis not present

## 2021-12-15 DIAGNOSIS — M9902 Segmental and somatic dysfunction of thoracic region: Secondary | ICD-10-CM | POA: Diagnosis not present

## 2021-12-15 DIAGNOSIS — M9907 Segmental and somatic dysfunction of upper extremity: Secondary | ICD-10-CM | POA: Diagnosis not present

## 2021-12-15 DIAGNOSIS — M9901 Segmental and somatic dysfunction of cervical region: Secondary | ICD-10-CM | POA: Diagnosis not present

## 2021-12-15 DIAGNOSIS — M7541 Impingement syndrome of right shoulder: Secondary | ICD-10-CM | POA: Diagnosis not present

## 2021-12-17 DIAGNOSIS — M9903 Segmental and somatic dysfunction of lumbar region: Secondary | ICD-10-CM | POA: Diagnosis not present

## 2021-12-17 DIAGNOSIS — M9907 Segmental and somatic dysfunction of upper extremity: Secondary | ICD-10-CM | POA: Diagnosis not present

## 2021-12-17 DIAGNOSIS — M7541 Impingement syndrome of right shoulder: Secondary | ICD-10-CM | POA: Diagnosis not present

## 2021-12-17 DIAGNOSIS — M9902 Segmental and somatic dysfunction of thoracic region: Secondary | ICD-10-CM | POA: Diagnosis not present

## 2021-12-17 DIAGNOSIS — M9901 Segmental and somatic dysfunction of cervical region: Secondary | ICD-10-CM | POA: Diagnosis not present

## 2021-12-24 DIAGNOSIS — M9902 Segmental and somatic dysfunction of thoracic region: Secondary | ICD-10-CM | POA: Diagnosis not present

## 2021-12-24 DIAGNOSIS — M9907 Segmental and somatic dysfunction of upper extremity: Secondary | ICD-10-CM | POA: Diagnosis not present

## 2021-12-24 DIAGNOSIS — M7541 Impingement syndrome of right shoulder: Secondary | ICD-10-CM | POA: Diagnosis not present

## 2021-12-24 DIAGNOSIS — M9901 Segmental and somatic dysfunction of cervical region: Secondary | ICD-10-CM | POA: Diagnosis not present

## 2021-12-24 DIAGNOSIS — M9903 Segmental and somatic dysfunction of lumbar region: Secondary | ICD-10-CM | POA: Diagnosis not present

## 2021-12-26 DIAGNOSIS — M9901 Segmental and somatic dysfunction of cervical region: Secondary | ICD-10-CM | POA: Diagnosis not present

## 2021-12-26 DIAGNOSIS — M7541 Impingement syndrome of right shoulder: Secondary | ICD-10-CM | POA: Diagnosis not present

## 2021-12-26 DIAGNOSIS — M9907 Segmental and somatic dysfunction of upper extremity: Secondary | ICD-10-CM | POA: Diagnosis not present

## 2021-12-26 DIAGNOSIS — M9903 Segmental and somatic dysfunction of lumbar region: Secondary | ICD-10-CM | POA: Diagnosis not present

## 2021-12-26 DIAGNOSIS — M9902 Segmental and somatic dysfunction of thoracic region: Secondary | ICD-10-CM | POA: Diagnosis not present

## 2022-01-01 DIAGNOSIS — M7541 Impingement syndrome of right shoulder: Secondary | ICD-10-CM | POA: Diagnosis not present

## 2022-01-01 DIAGNOSIS — M9907 Segmental and somatic dysfunction of upper extremity: Secondary | ICD-10-CM | POA: Diagnosis not present

## 2022-01-01 DIAGNOSIS — M9902 Segmental and somatic dysfunction of thoracic region: Secondary | ICD-10-CM | POA: Diagnosis not present

## 2022-01-01 DIAGNOSIS — M9901 Segmental and somatic dysfunction of cervical region: Secondary | ICD-10-CM | POA: Diagnosis not present

## 2022-01-01 DIAGNOSIS — M9903 Segmental and somatic dysfunction of lumbar region: Secondary | ICD-10-CM | POA: Diagnosis not present

## 2022-01-01 NOTE — Telephone Encounter (Addendum)
I spoke with patient and informed her of the credentialing delays with [employer]BS that is delaying her referral to Dr. Helane Rima at [facility]  Per their referral coordinator [employer] told them it may take a couple more months. When they are credentialed she will start contacting the 168 [employer]BS patients she is holding to schedule in the order that referrals were received starting in March.  Patient was informed of all this via a message that was left for her from their referral coordinator on 11/20/21 and I spoke with her today and informed her of this.  She said that she was referred to [facility] by her pulmonary provider and that schedule is running 9 months out.  She said that Dr. Edward Jolly spoke highly of Dr. Earlene Plater and unless Dr. Edward Jolly knows of someone equally  highly recommended she is fine waiting for their practice to schedule her.  I did tell her I will continue to follow her referral and keep her informed.

## 2022-01-01 NOTE — Telephone Encounter (Signed)
Either [facility] or Dr. Briscoe Deutscher with [facility] are good options.   I recommend closing this phone encounter and placing the patient in a recall system.

## 2022-01-01 NOTE — Telephone Encounter (Signed)
I spoke with Dr. Wallace's office. They are still waiting on their contract to be finalized with [employer]BS. They are offering patient's to be seen and they will hold claim until all is finalized with the understanding if something happened and it did not finalize patient would be responsible for balance of visit.  I left message with Hope the referral coordinator to see if she has spoke with patient. 

## 2022-03-12 ENCOUNTER — Encounter: Payer: Self-pay | Admitting: Obstetrics and Gynecology

## 2022-03-15 NOTE — Progress Notes (Signed)
Office Visit Note  Patient: Kristie Baldwin             Date of Birth: [birth year]           MRN: [id number]             PCP: Olive Bass, FNP Referring: Olive Bass,* Visit Date: 03/17/2022 Occupation: Customer representative  Subjective:  New Patient (Initial Visit) (Abnormal labs)   History of Present Illness: Kristie Baldwin is a 51 y.o. female here for evaluation of positive ANA checked in association with severe fatigue, muscle pains, night sweats, and unintentional weight gains. She has a history of asthma and GERD. She has experience menopausal symptoms with night sweating but not at her usual. Lab testing was checked in January due to numerous symptoms with very low ANA 1:40 detected otherwise normal cell counts, metabolic panel, and thyroid function panel. Repeat ANA in April was positive at 1:80 other labs unremarkable. ***   Labs reviewed 11/2021 ANA 1:80 homogenous RF neg  08/2021 ANA 1:40 homogenous TSH wnl  Activities of Daily Living:  Patient reports morning stiffness for 20 minutes.   Patient Reports nocturnal pain.  Difficulty dressing/grooming: Denies Difficulty climbing stairs: Reports Difficulty getting out of chair: Denies Difficulty using hands for taps, buttons, cutlery, and/or writing: Denies  Review of Systems  Constitutional:  Positive for fatigue.  HENT:  Negative for mouth sores and mouth dryness.   Eyes:  Positive for dryness.  Respiratory:  Positive for shortness of breath.   Cardiovascular:  Negative for chest pain and palpitations.  Gastrointestinal:  Positive for constipation. Negative for blood in stool and diarrhea.  Endocrine: Negative for increased urination.  Genitourinary:  Negative for involuntary urination.  Musculoskeletal:  Positive for joint pain, joint pain, muscle weakness and morning stiffness. Negative for joint swelling, myalgias, muscle tenderness and myalgias.  Skin:  Negative for color  change, rash, hair loss and sensitivity to sunlight.  Allergic/Immunologic: Positive for susceptible to infections.  Neurological:  Positive for headaches. Negative for dizziness.  Hematological:  Negative for swollen glands.  Psychiatric/Behavioral:  Positive for depressed mood. Negative for sleep disturbance. The patient is nervous/anxious.     PMFS History:  Patient Active Problem List   Diagnosis Date Noted  . Asthma 09/25/2021  . Weight loss counseling, encounter for 12/17/2017  . Obesity 06/04/2017  . NAFLD (nonalcoholic fatty liver disease) 74/25/9563    Past Medical History:  Diagnosis Date  . Abnormal Pap smear of cervix   . Depression   . Dysmenorrhea   . Peptic ulcer   . STD (sexually transmitted disease)    HSV    Family History  Problem Relation Age of Onset  . Hypertension Mother   . Arthritis Mother        OA  . Atrial fibrillation Mother        medication  . Heart Problems Father        pace maker 2018  . Prostate cancer Father   . Atrial fibrillation Father   . Breast cancer Neg Hx   . Colon cancer Neg Hx   . Stomach cancer Neg Hx   . Pancreatic cancer Neg Hx    History reviewed. No pertinent surgical history. Social History   Social History Narrative  . Not on file   Immunization History  Administered Date(s) Administered  . PFIZER(Purple Top)SARS-COV-2 Vaccination 10/26/2019, 11/23/2019, 08/16/2020  . Tdap 08/27/2011     Objective: Vital Signs: BP 133/86 (BP Location:  Right Arm, Patient Position: Sitting, Cuff Size: Normal)   Pulse 80   Resp 15   Ht 5\' 8"  (1.727 m)   Wt 224 lb (101.6 kg)   LMP 10/05/2019 (Exact Date) Comment: 09-30-2020 bleeding  BMI 34.06 kg/m    Physical Exam   Musculoskeletal Exam: ***  CDAI Exam: CDAI Score: -- Patient Global: --; Provider Global: -- Swollen: --; Tender: -- Joint Exam 03/17/2022   No joint exam has been documented for this visit   There is currently no information documented on the  homunculus. Go to the Rheumatology activity and complete the homunculus joint exam.  Investigation: No additional findings.  Imaging: No results found.  Recent Labs: Lab Results  Component Value Date   WBC 7.3 08/22/2021   HGB 12.3 08/22/2021   PLT 300.0 08/22/2021   NA 139 08/22/2021   K 4.2 08/22/2021   CL 103 08/22/2021   CO2 29 08/22/2021   GLUCOSE 76 08/22/2021   BUN 14 08/22/2021   CREATININE 1.06 08/22/2021   BILITOT 0.4 08/22/2021   ALKPHOS 59 08/22/2021   AST 21 08/22/2021   ALT 20 08/22/2021   PROT 7.0 08/22/2021   ALBUMIN 4.3 08/22/2021   CALCIUM 9.2 08/22/2021    Speciality Comments: No specialty comments available.  Procedures:  No procedures performed Allergies: Monistat [miconazole]   Assessment / Plan:     Visit Diagnoses: No diagnosis found.  Orders: No orders of the defined types were placed in this encounter.  No orders of the defined types were placed in this encounter.   Face-to-face time spent with patient was *** minutes. Greater than 50% of time was spent in counseling and coordination of care.  Follow-Up Instructions: No follow-ups on file.   08/24/2021, MD  Note - This record has been created using [employer] Plan.  Chart creation errors have been sought, but may not always  have been located. Such creation errors do not reflect on  the standard of medical care.

## 2022-03-17 ENCOUNTER — Encounter: Payer: Self-pay | Admitting: Internal Medicine

## 2022-03-17 ENCOUNTER — Ambulatory Visit: Payer: BC Managed Care – PPO | Attending: Internal Medicine | Admitting: Internal Medicine

## 2022-03-17 VITALS — BP 133/86 | HR 80 | Resp 15 | Ht 68.0 in | Wt 224.0 lb

## 2022-03-17 DIAGNOSIS — R5383 Other fatigue: Secondary | ICD-10-CM | POA: Diagnosis not present

## 2022-03-17 DIAGNOSIS — R768 Other specified abnormal immunological findings in serum: Secondary | ICD-10-CM

## 2022-03-17 DIAGNOSIS — E669 Obesity, unspecified: Secondary | ICD-10-CM | POA: Diagnosis not present

## 2022-03-18 LAB — ANTI-SMITH ANTIBODY: ENA SM Ab Ser-aCnc: <1 AI

## 2022-03-18 LAB — SJOGRENS SYNDROME-A EXTRACTABLE NUCLEAR ANTIBODY: SSA (Ro) (ENA) Antibody, IgG: <1 AI

## 2022-03-18 LAB — ANTI-DNA ANTIBODY, DOUBLE-STRANDED: ds DNA Ab: 1 IU/mL

## 2022-03-18 LAB — RNP ANTIBODY: Ribonucleic Protein(ENA) Antibody, IgG: <1 AI

## 2022-03-18 LAB — C3 AND C4
C3 Complement: 154 mg/dL (ref 83–193)
C4 Complement: 26 mg/dL (ref 15–57)

## 2022-03-18 LAB — SJOGRENS SYNDROME-B EXTRACTABLE NUCLEAR ANTIBODY: SSB (La) (ENA) Antibody, IgG: <1 AI

## 2022-03-18 LAB — SEDIMENTATION RATE: Sed Rate: 11 mm/h (ref 0–30)

## 2022-03-19 DIAGNOSIS — R5383 Other fatigue: Secondary | ICD-10-CM | POA: Diagnosis not present

## 2022-03-19 DIAGNOSIS — Z780 Asymptomatic menopausal state: Secondary | ICD-10-CM | POA: Diagnosis not present

## 2022-03-19 DIAGNOSIS — E8889 Other specified metabolic disorders: Secondary | ICD-10-CM | POA: Diagnosis not present

## 2022-03-19 DIAGNOSIS — Z9189 Other specified personal risk factors, not elsewhere classified: Secondary | ICD-10-CM | POA: Diagnosis not present

## 2022-03-19 DIAGNOSIS — Z6834 Body mass index (BMI) 34.0-34.9, adult: Secondary | ICD-10-CM | POA: Diagnosis not present

## 2022-03-19 DIAGNOSIS — M1990 Unspecified osteoarthritis, unspecified site: Secondary | ICD-10-CM | POA: Diagnosis not present

## 2022-03-19 DIAGNOSIS — F329 Major depressive disorder, single episode, unspecified: Secondary | ICD-10-CM | POA: Diagnosis not present

## 2022-03-19 DIAGNOSIS — K219 Gastro-esophageal reflux disease without esophagitis: Secondary | ICD-10-CM | POA: Diagnosis not present

## 2022-03-20 NOTE — Progress Notes (Signed)
All of the more specific lab test to evaluate her positive ANA have come back completely negative.  This is reassuring that her findings don't appear to indicate an autoimmune disease. We can cancel the scheduled follow-up appointment later this month.

## 2022-04-06 ENCOUNTER — Ambulatory Visit: Payer: BC Managed Care – PPO | Admitting: Internal Medicine

## 2022-04-09 DIAGNOSIS — F329 Major depressive disorder, single episode, unspecified: Secondary | ICD-10-CM | POA: Diagnosis not present

## 2022-04-09 DIAGNOSIS — K219 Gastro-esophageal reflux disease without esophagitis: Secondary | ICD-10-CM | POA: Diagnosis not present

## 2022-04-09 DIAGNOSIS — Z780 Asymptomatic menopausal state: Secondary | ICD-10-CM | POA: Diagnosis not present

## 2022-04-09 DIAGNOSIS — Z9189 Other specified personal risk factors, not elsewhere classified: Secondary | ICD-10-CM | POA: Diagnosis not present

## 2022-04-09 DIAGNOSIS — M1990 Unspecified osteoarthritis, unspecified site: Secondary | ICD-10-CM | POA: Diagnosis not present

## 2022-04-14 ENCOUNTER — Telehealth: Payer: Self-pay

## 2022-04-14 NOTE — Telephone Encounter (Signed)
Encounter reviewed and closed.  

## 2022-04-14 NOTE — Progress Notes (Unsigned)
GYNECOLOGY  VISIT   HPI: 51 y.o.   Married  Caucasian  female   G1P0010 with Patient's last menstrual period was 10/05/2019 (exact date).   here for menopausal symptoms. Patient states 04-09-22 began suddenly with severe low pelvic cramping--like she use to have with menses. No bleeding. She also became very emotional and crying over eveything. Redness and rash around fingernails. Denies any urinary symptoms.  Some constipation.  Some nausea.  No vomiting.  No fever.  No painful urination.  No blood in the stool or urine.   No partner change.    Also experiencing breast tenderness.   Feeling bloated.   No change in her HRT.  No skipped doses.  Hot flashes are improved overall.   She had a pelvic US 10/29/20 for postmenopausal bleeding on HRT: Uterus no myometrial masses. EMS 5.70 mm. No masses. Ovaries:  Right ovary normal and left ovary not seen.  No adnexal masses. No free fluid.  Her endometrial biopsy showed scanty inactive endometrial glands and mucous.  No atypia or malignancy were seen.   Patient is on Effexor 37.5 mg.  Does not feel stressed anymore.  Not dealing with anxiety.  Her PCP has taken over this prescription.   GYNECOLOGIC HISTORY: Patient's last menstrual period was 10/05/2019 (exact date). Contraception:  PMP Menopausal hormone therapy:  Vivelle Dot 0.075mg , Progesterone 200mg  Last mammogram:  11-20-21 Neg/Birads1 Last pap smear:   11-12-20 Neg:Neg HR HPV, 09-16-15 Neg:Neg HR HPV, 2014 Neg               OB History     Gravida  1   Para  0   Term  0   Preterm  0   AB  1   Living  0      SAB  0   IAB  1   Ectopic  0   Multiple  0   Live Births  0              Patient Active Problem List   Diagnosis Date Noted   Positive ANA (antinuclear antibody) 03/17/2022   Other fatigue 03/17/2022   Asthma 09/25/2021   Weight loss counseling, encounter for 12/17/2017   Obesity 06/04/2017    Past Medical History:  Diagnosis Date   Abnormal  Pap smear of cervix    Depression    Dysmenorrhea    Peptic ulcer    STD (sexually transmitted disease)    HSV    History reviewed. No pertinent surgical history.  Current Outpatient Medications  Medication Sig Dispense Refill   albuterol (VENTOLIN HFA) 108 (90 Base) MCG/ACT inhaler Inhale 2 puffs into the lungs every 6 (six) hours as needed for wheezing or shortness of breath. 8 g 3   Calcium Carb-Cholecalciferol (CALCIUM 1000 + D PO) Take by mouth.     Cholecalciferol (VITAMIN D3 PO) Take by mouth.     clindamycin (CLEOCIN T) 1 % external solution SMARTSIG:Topical 1-2 Times Daily PRN     COLLAGEN PO Take 1 tablet by mouth daily.     Cyanocobalamin (B-12 PO) Take by mouth.     estradiol (VIVELLE-DOT) 0.075 MG/24HR Place 1 patch onto the skin 2 (two) times a week. 8 patch 8   ibuprofen (ADVIL) 800 MG tablet Take 1 tablet (800 mg total) by mouth every 8 (eight) hours as needed. 30 tablet 1   pantoprazole (PROTONIX) 40 MG tablet TAKE 1 TABLET(40 MG) BY MOUTH TWICE DAILY BEFORE A MEAL 180 tablet 1  progesterone (PROMETRIUM) 200 MG capsule TAKE 1 CAPSULE(200 MG) BY MOUTH AT BEDTIME. 90 capsule 3   valACYclovir (VALTREX) 500 MG tablet Take one tablet (500 mg) by mouth daily.   Take one tablet (500 mg) by mouth twice a day for 3 days as needed for an outbreak. 110 tablet 3   venlafaxine XR (EFFEXOR-XR) 37.5 MG 24 hr capsule Take 37.5 mg by mouth daily with breakfast.     No current facility-administered medications for this visit.     ALLERGIES: Monistat [miconazole]  Family History  Problem Relation Age of Onset   Hypertension Mother    Arthritis Mother        OA   Atrial fibrillation Mother        medication   Heart Problems Father        pace maker 2018   Prostate cancer Father    Atrial fibrillation Father    Breast cancer Neg Hx    Colon cancer Neg Hx    Stomach cancer Neg Hx    Pancreatic cancer Neg Hx     Social History   Socioeconomic History   Marital status:  Married    Spouse name: Not on file   Number of children: 0   Years of education: Not on file   Highest education level: Not on file  Occupational History    Employer: [employer]  Tobacco Use   Smoking status: Former    Packs/day: 0.25    Years: 0.00    Total pack years: 0.00    Types: Cigarettes    Quit date: 08/11/1991    Years since quitting: 30.6   Smokeless tobacco: Never   Tobacco comments:    Social smoker  Vaping Use   Vaping Use: Never used  Substance and Sexual Activity   Alcohol use: Yes    Alcohol/week: 2.0 standard drinks of alcohol    Types: 2 Standard drinks or equivalent per week   Drug use: No   Sexual activity: Not Currently    Partners: Male    Birth control/protection: Post-menopausal  Other Topics Concern   Not on file  Social History Narrative   Not on file   Social Determinants of Health   Financial Resource Strain: Not on file  Food Insecurity: Not on file  Transportation Needs: Not on file  Physical Activity: Not on file  Stress: Not on file  Social Connections: Not on file  Intimate Partner Violence: Not on file    Review of Systems  Genitourinary:  Positive for pelvic pain.  Skin:        Irritation around fingernails  Psychiatric/Behavioral:  The patient is nervous/anxious (with crying episodes).   All other systems reviewed and are negative.   PHYSICAL EXAMINATION:    BP 120/72   Pulse (!) 101   Ht 5' 7.5" (1.715 m)   Wt 221 lb (100.2 kg)   LMP 10/05/2019 (Exact Date) Comment: 09-30-2020 bleeding  SpO2 99%   BMI 34.10 kg/m     General appearance: alert, cooperative and appears stated age  Abdomen: soft, non-tender, no masses,  no organomegaly  Skin: violaceous rash of fingertips.     Pelvic: External genitalia:  no lesions              Urethra:  normal appearing urethra with no masses, tenderness or lesions              Bartholins and Skenes: normal  Vagina: normal appearing vagina with normal color and  white discharge, no lesions              Cervix: no lesions.  No CMT.                Bimanual Exam:  Uterus:  difficult to assess due to involuntary guarding.               Adnexa: no mass, fullness, tenderness             Chaperone was present for exam:  Marchelle Folks, CMA  ASSESSMENT  Pelvic pain.  Undetermined etiology.  No acute abdomen.  HRT.  Rash of hands.   PLAN  Will proceed with pelvic ultrasound tomorrow am.  Motrin 800 mg po q 8 hour prn.  Heating pad prn.  She will call her PCP to be seen today or go to urgent care about the rash of her hands.    An After Visit Summary was printed and given to the patient.  25 min  total time was spent for this patient encounter, including preparation, face-to-face counseling with the patient, coordination of care, and documentation of the encounter.

## 2022-04-14 NOTE — Telephone Encounter (Signed)
Patient called in voice mail stating she has appt next week on Tuesday because she has been cramping terribly x 6 days. 800 mg Ibuprofen not relieving her cramps.  She has had no spotting or bleeding. SHe is complaining of extreme menopausal sx that she says are like extreme PMS sx. She was asking what she can do until next Tuesday.  I called patient back and offered her appt at 8:30am in the morning 9/6 with Dr. Edward Jolly. She was very happy to get in sooner for this visit and was r/s'd to tomorrow morning.  I told her I would make Dr. Edward Jolly aware of what is occurring with her.

## 2022-04-15 ENCOUNTER — Encounter: Payer: Self-pay | Admitting: Obstetrics and Gynecology

## 2022-04-15 ENCOUNTER — Telehealth: Payer: Self-pay | Admitting: Family

## 2022-04-15 ENCOUNTER — Encounter: Payer: Self-pay | Admitting: Family Medicine

## 2022-04-15 ENCOUNTER — Ambulatory Visit: Payer: BC Managed Care – PPO | Admitting: Family Medicine

## 2022-04-15 ENCOUNTER — Ambulatory Visit: Payer: BC Managed Care – PPO | Admitting: Obstetrics and Gynecology

## 2022-04-15 VITALS — BP 114/78 | HR 100 | Temp 98.2°F | Ht 67.5 in | Wt 225.4 lb

## 2022-04-15 VITALS — BP 120/72 | HR 101 | Ht 67.5 in | Wt 221.0 lb

## 2022-04-15 DIAGNOSIS — R21 Rash and other nonspecific skin eruption: Secondary | ICD-10-CM

## 2022-04-15 DIAGNOSIS — R102 Pelvic and perineal pain: Secondary | ICD-10-CM | POA: Diagnosis not present

## 2022-04-15 LAB — CBC WITH DIFFERENTIAL/PLATELET
Basophils Absolute: 0.1 10*3/uL (ref 0.0–0.1)
Basophils Relative: 1 % (ref 0.0–3.0)
Eosinophils Absolute: 0.2 10*3/uL (ref 0.0–0.7)
Eosinophils Relative: 2.1 % (ref 0.0–5.0)
HCT: 38.1 % (ref 36.0–46.0)
Hemoglobin: 12.4 g/dL (ref 12.0–15.0)
Lymphocytes Relative: 19.6 % (ref 12.0–46.0)
Lymphs Abs: 1.6 10*3/uL (ref 0.7–4.0)
MCHC: 32.6 g/dL (ref 30.0–36.0)
MCV: 85.5 fl (ref 78.0–100.0)
Monocytes Absolute: 0.4 10*3/uL (ref 0.1–1.0)
Monocytes Relative: 4.8 % (ref 3.0–12.0)
Neutro Abs: 5.9 10*3/uL (ref 1.4–7.7)
Neutrophils Relative %: 72.5 % (ref 43.0–77.0)
Platelets: 317 10*3/uL (ref 150.0–400.0)
RBC: 4.46 Mil/uL (ref 3.87–5.11)
RDW: 14.2 % (ref 11.5–15.5)
WBC: 8.1 10*3/uL (ref 4.0–10.5)

## 2022-04-15 MED ORDER — PREDNISONE 10 MG PO TABS: ORAL_TABLET | ORAL | 0 refills | Status: DC

## 2022-04-15 MED ORDER — IBUPROFEN 800 MG PO TABS: 800.0000 mg | ORAL_TABLET | Freq: Three times a day (TID) | ORAL | 0 refills | Status: DC | PRN

## 2022-04-15 NOTE — Progress Notes (Signed)
GYNECOLOGY  VISIT   HPI: 51 y.o.   Married  Caucasian  female   G1P0010 with Patient's last menstrual period was 10/05/2019 (exact date).   here for pelvic ultrasound.  Cramping has increased.   Her last BM was 2 days ago.  Having constipation.   Has not done a colonoscopy yet.   Her PCP put her on Prednisone yesterday for the irritation around her nails. Thinks it can be a reaction to acrylic.   GYNECOLOGIC HISTORY: Patient's last menstrual period was 10/05/2019 (exact date). Contraception:  PMP Menopausal hormone therapy:  Vivelle Dot 0.075mg , Progesterone 200mg  Last mammogram:   11-20-21 Neg/Birads1 Last pap smear:   11-12-20 Neg:Neg HR HPV, 09-16-15 Neg:Neg HR HPV, 2014 Neg        OB History     Gravida  1   Para  0   Term  0   Preterm  0   AB  1   Living  0      SAB  0   IAB  1   Ectopic  0   Multiple  0   Live Births  0              Patient Active Problem List   Diagnosis Date Noted   Positive ANA (antinuclear antibody) 03/17/2022   Other fatigue 03/17/2022   Asthma 09/25/2021   Weight loss counseling, encounter for 12/17/2017   Obesity 06/04/2017    Past Medical History:  Diagnosis Date   Abnormal Pap smear of cervix    Depression    Dysmenorrhea    Peptic ulcer    STD (sexually transmitted disease)    HSV    History reviewed. No pertinent surgical history.  Current Outpatient Medications  Medication Sig Dispense Refill   albuterol (VENTOLIN HFA) 108 (90 Base) MCG/ACT inhaler Inhale 2 puffs into the lungs every 6 (six) hours as needed for wheezing or shortness of breath. 8 g 3   Calcium Carb-Cholecalciferol (CALCIUM 1000 + D PO) Take by mouth.     Cholecalciferol (VITAMIN D3 PO) Take by mouth.     clindamycin (CLEOCIN T) 1 % external solution      COLLAGEN PO Take 1 tablet by mouth daily.     Cyanocobalamin (B-12 PO) Take by mouth.     estradiol (VIVELLE-DOT) 0.075 MG/24HR Place 1 patch onto the skin 2 (two) times a week. 8 patch 8    ibuprofen (ADVIL) 800 MG tablet Take 1 tablet (800 mg total) by mouth every 8 (eight) hours as needed. 30 tablet 0   pantoprazole (PROTONIX) 40 MG tablet TAKE 1 TABLET(40 MG) BY MOUTH TWICE DAILY BEFORE A MEAL 180 tablet 1   predniSONE (DELTASONE) 10 MG tablet 2 tabs po qd x 3d, then 1 tab po qd x 3d 9 tablet 0   progesterone (PROMETRIUM) 200 MG capsule TAKE 1 CAPSULE(200 MG) BY MOUTH AT BEDTIME. 90 capsule 3   valACYclovir (VALTREX) 500 MG tablet Take one tablet (500 mg) by mouth daily.   Take one tablet (500 mg) by mouth twice a day for 3 days as needed for an outbreak. 110 tablet 3   venlafaxine XR (EFFEXOR-XR) 37.5 MG 24 hr capsule Take 37.5 mg by mouth daily with breakfast.     No current facility-administered medications for this visit.     ALLERGIES: Monistat [miconazole]  Family History  Problem Relation Age of Onset   Hypertension Mother    Arthritis Mother        OA  Atrial fibrillation Mother        medication   Heart Problems Father        pace maker 2018   Prostate cancer Father    Atrial fibrillation Father    Breast cancer Neg Hx    Colon cancer Neg Hx    Stomach cancer Neg Hx    Pancreatic cancer Neg Hx     Social History   Socioeconomic History   Marital status: Married    Spouse name: Not on file   Number of children: 0   Years of education: Not on file   Highest education level: Not on file  Occupational History    Employer: [employer]  Tobacco Use   Smoking status: Former    Packs/day: 0.25    Years: 0.00    Total pack years: 0.00    Types: Cigarettes    Quit date: 08/11/1991    Years since quitting: 30.7   Smokeless tobacco: Never   Tobacco comments:    Social smoker  Vaping Use   Vaping Use: Never used  Substance and Sexual Activity   Alcohol use: Yes    Alcohol/week: 2.0 standard drinks of alcohol    Types: 2 Standard drinks or equivalent per week   Drug use: No   Sexual activity: Not Currently    Partners: Male    Birth  control/protection: Post-menopausal  Other Topics Concern   Not on file  Social History Narrative   Not on file   Social Determinants of Health   Financial Resource Strain: Low Risk  (04/15/2022)   Overall Financial Resource Strain (CARDIA)    Difficulty of Paying Living Expenses: Not hard at all  Food Insecurity: No Food Insecurity (04/15/2022)   Hunger Vital Sign    Worried About Running Out of Food in the Last Year: Never true    Ran Out of Food in the Last Year: Never true  Transportation Needs: No Transportation Needs (04/15/2022)   PRAPARE - Administrator, Civil Service (Medical): No    Lack of Transportation (Non-Medical): No  Physical Activity: Unknown (04/15/2022)   Exercise Vital Sign    Days of Exercise per Week: Patient refused    Minutes of Exercise per Session: Not on file  Stress: No Stress Concern Present (04/15/2022)   [employer] of Occupational Health - Occupational Stress Questionnaire    Feeling of Stress : Not at all  Social Connections: Unknown (04/15/2022)   Social Connection and Isolation Panel [NHANES]    Frequency of Communication with Friends and Family: Patient refused    Frequency of Social Gatherings with Friends and Family: Patient refused    Attends Religious Services: Patient refused    Database administrator or Organizations: Patient refused    Attends Engineer, structural: Not on file    Marital Status: Married  Catering manager Violence: Not on file    Review of Systems  All other systems reviewed and are negative.   PHYSICAL EXAMINATION:    BP 120/74   Ht 5' 7.5" (1.715 m)   Wt 221 lb (100.2 kg)   LMP 10/05/2019 (Exact Date) Comment: 09-30-2020 bleeding  BMI 34.10 kg/m     General appearance: alert, cooperative and appears stated age   Pelvic US  Uterus 8.25 x 4.66 x 3.72 cm.  Heterogenous.  No masses.  EMS 4.12 mm.  No masses.  Left ovary 2.04 x 1.64 x 0.72 cm.  Right ovary not seen.  No  adnexal masses.  No  free fluid.  Moderate stool burned. Bowel gas noted.   ASSESSMENT  Pelvic pain.  Constipation.  Colon cancer screening.   PLAN  Pelvic US images and report reviewed.  We discussed treatment and prevention of constipation.   Dietary choices reviewed.  Medications to treat constipation reviewed.  Referral for colonoscopy.    An After Visit Summary was printed and given to the patient.  21 min  total time was spent for this patient encounter, including preparation, face-to-face counseling with the patient, coordination of care, and documentation of the encounter.

## 2022-04-15 NOTE — Telephone Encounter (Signed)
I have called pt and got pt scheduled to see dr. Milinda Cave at 11:20p today. Pt stated understanding and was in agreement to the plan.

## 2022-04-15 NOTE — Telephone Encounter (Signed)
Pt sttaed she woke up with some type of rash around her fingers. There is one big red spot and a bunch of small ones around it and it is very painful. She stated it almost looks like it was blistering up, but did not have any other sxs. Transferred to triage for nurse eval.

## 2022-04-15 NOTE — Telephone Encounter (Signed)
Caller Name [name] Caller Phone Number [phone] Patient Name Kristie Baldwin Patient DOB 06/07/1971 Call Type Message Only Information Provided Reason for Call Request to Schedule Office Appointment Initial Comment Caller states, has a red rash all over her fingers. One is really red, Looks like it's blistering up. Needs to be seen by her PCP, per her GYN. Disp. Time Disposition Final User 04/15/2022 9:18:42 AM General Information Provided Yes Merrily Pew Call Closed By: Merrily Pew Transaction Date/Time: 04/15/2022 9:16:01 AM (ET)

## 2022-04-15 NOTE — Progress Notes (Signed)
OFFICE VISIT  04/15/2022  CC:  Chief Complaint  Patient presents with   Rash   Patient is a 51 y.o. female who presents for rash.  HPI:  Onset this morning of reddish skin discoloration just proximal to her fingernails on both hands. Itches quite a bit and feels a tingly sensation in the tips of fingers.  Just a mild amount of discomfort/tenderness. Otherwise she is without any symptoms: Including, no joint pain, muscle aches, fatigue, fevers, respiratory symptoms, gastrointestinal symptoms, or malaise.  No recent tick bites. The rest of her skin is normal. She has never had this kind of rash before. No recent new potential irritants or allergens.  No recent new medications.  She did have 2 low positive ANA tests in the setting of fatigue in the past and got rheumatologic evaluation and this work-up was normal.  She did not have this rash at the time.  ROS as above, plus-->  no CP, no SOB, no wheezing, no cough, no dizziness, no HAs, no rashes, no melena/hematochezia.  No polyuria or polydipsia.   No focal weakness, paresthesias, or tremors.  No acute vision or hearing abnormalities.  No dysuria or unusual/new urinary urgency or frequency.  No recent changes in lower legs. No n/v/d or abd pain.  No palpitations.    Past Medical History:  Diagnosis Date   Abnormal Pap smear of cervix    Depression    Dysmenorrhea    Peptic ulcer    STD (sexually transmitted disease)    HSV    History reviewed. No pertinent surgical history.  Outpatient Medications Prior to Visit  Medication Sig Dispense Refill   Calcium Carb-Cholecalciferol (CALCIUM 1000 + D PO) Take by mouth.     Cholecalciferol (VITAMIN D3 PO) Take by mouth.     COLLAGEN PO Take 1 tablet by mouth daily.     Cyanocobalamin (B-12 PO) Take by mouth.     estradiol (VIVELLE-DOT) 0.075 MG/24HR Place 1 patch onto the skin 2 (two) times a week. 8 patch 8   pantoprazole (PROTONIX) 40 MG tablet TAKE 1 TABLET(40 MG) BY MOUTH TWICE  DAILY BEFORE A MEAL 180 tablet 1   progesterone (PROMETRIUM) 200 MG capsule TAKE 1 CAPSULE(200 MG) BY MOUTH AT BEDTIME. 90 capsule 3   valACYclovir (VALTREX) 500 MG tablet Take one tablet (500 mg) by mouth daily.   Take one tablet (500 mg) by mouth twice a day for 3 days as needed for an outbreak. 110 tablet 3   venlafaxine XR (EFFEXOR-XR) 37.5 MG 24 hr capsule Take 37.5 mg by mouth daily with breakfast.     albuterol (VENTOLIN HFA) 108 (90 Base) MCG/ACT inhaler Inhale 2 puffs into the lungs every 6 (six) hours as needed for wheezing or shortness of breath. (Patient not taking: Reported on 04/15/2022) 8 g 3   clindamycin (CLEOCIN T) 1 % external solution SMARTSIG:Topical 1-2 Times Daily PRN (Patient not taking: Reported on 04/15/2022)     ibuprofen (ADVIL) 800 MG tablet Take 1 tablet (800 mg total) by mouth every 8 (eight) hours as needed. (Patient not taking: Reported on 04/15/2022) 30 tablet 0   No facility-administered medications prior to visit.    Allergies  Allergen Reactions   Monistat [Miconazole] Other (See Comments)    Vaginal burning.    ROS As per HPI  PE:    04/15/2022   11:00 AM 04/15/2022    8:24 AM 03/17/2022    8:11 AM  Vitals with BMI  Height 5' 7.5"  5' 7.5"   Weight 225 lbs 6 oz 221 lbs   BMI 34.76 34.08   Systolic 114 120 569  Diastolic 78 72 86  Pulse 100 101 80     Physical Exam  Gen: Alert, well appearing.  Patient is oriented to person, place, time, and situation.. AFFECT: pleasant, lucid thought and speech. SKIN: On the distal phalanx of each finger of both hands there is a deep purple/lipstick colored patch of erythema on the dorsal aspect, not including nail folds.  No papular component.  It does not blanch much at all to pressure.  It is not minimally uncomfortable to firm palpation.  There is no swelling of any joints.  This rash does n appear to extend over the DIP joint.  Range of motion of all fingers normal without stiffness. Normal strength in  hands.  LABS:  Last CBC Lab Results  Component Value Date   WBC 7.3 08/22/2021   HGB 12.3 08/22/2021   HCT 38.3 08/22/2021   MCV 84.4 08/22/2021   MCH 27.4 05/02/2021   RDW 13.6 08/22/2021   PLT 300.0 08/22/2021   Last metabolic panel Lab Results  Component Value Date   GLUCOSE 76 08/22/2021   NA 139 08/22/2021   K 4.2 08/22/2021   CL 103 08/22/2021   CO2 29 08/22/2021   BUN 14 08/22/2021   CREATININE 1.06 08/22/2021   CALCIUM 9.2 08/22/2021   PROT 7.0 08/22/2021   ALBUMIN 4.3 08/22/2021   BILITOT 0.4 08/22/2021   ALKPHOS 59 08/22/2021   AST 21 08/22/2021   ALT 20 08/22/2021   Last hemoglobin A1c Lab Results  Component Value Date   HGBA1C 5.1 05/02/2021   IMPRESSION AND PLAN:  Rash, unknown etiology.   We will treat as an inflammatory process: Prednisone 20 mg a day x3 days, then 10 mg a day x3 days.  Recommended no topical medication. Discussed possible need for biopsy if not improved at follow-up visit. Check CBC with differential today.  An After Visit Summary was printed and given to the patient.  FOLLOW UP: No follow-ups on file.  Signed:  Santiago Bumpers, MD           04/15/2022

## 2022-04-16 ENCOUNTER — Ambulatory Visit (INDEPENDENT_AMBULATORY_CARE_PROVIDER_SITE_OTHER): Payer: BC Managed Care – PPO

## 2022-04-16 ENCOUNTER — Ambulatory Visit (INDEPENDENT_AMBULATORY_CARE_PROVIDER_SITE_OTHER): Payer: BC Managed Care – PPO | Admitting: Obstetrics and Gynecology

## 2022-04-16 ENCOUNTER — Encounter: Payer: Self-pay | Admitting: Obstetrics and Gynecology

## 2022-04-16 ENCOUNTER — Other Ambulatory Visit: Payer: Self-pay | Admitting: Obstetrics and Gynecology

## 2022-04-16 VITALS — BP 120/74 | Ht 67.5 in | Wt 221.0 lb

## 2022-04-16 DIAGNOSIS — K5900 Constipation, unspecified: Secondary | ICD-10-CM

## 2022-04-16 DIAGNOSIS — R102 Pelvic and perineal pain: Secondary | ICD-10-CM | POA: Diagnosis not present

## 2022-04-16 DIAGNOSIS — Z1211 Encounter for screening for malignant neoplasm of colon: Secondary | ICD-10-CM

## 2022-04-16 NOTE — Patient Instructions (Signed)
Options for over the counter medications to treat constipation are: Colace or Pericolace Senna- S  Miralax  Metamucil Suppositories such as a glycerin suppository or dulcolax suppository Magnesium citrate liquid (very powerful)  Constipation, Adult Constipation is when a person has fewer than three bowel movements in a week, has difficulty having a bowel movement, or has stools (feces) that are dry, hard, or larger than normal. Constipation may be caused by an underlying condition. It may become worse with age if a person takes certain medicines and does not take in enough fluids. Follow these instructions at home: Eating and drinking  Eat foods that have a lot of fiber, such as beans, whole grains, and fresh fruits and vegetables. Limit foods that are low in fiber and high in fat and processed sugars, such as fried or sweet foods. These include french fries, hamburgers, cookies, candies, and soda. Drink enough fluid to keep your urine pale yellow. General instructions Exercise regularly or as told by your health care provider. Try to do 150 minutes of moderate exercise each week. Use the bathroom when you have the urge to go. Do not hold it in. Take over-the-counter and prescription medicines only as told by your health care provider. This includes any fiber supplements. During bowel movements: Practice deep breathing while relaxing the lower abdomen. Practice pelvic floor relaxation. Watch your condition for any changes. Let your health care provider know about them. Keep all follow-up visits as told by your health care provider. This is important. Contact a health care provider if: You have pain that gets worse. You have a fever. You do not have a bowel movement after 4 days. You vomit. You are not hungry or you lose weight. You are bleeding from the opening between the buttocks (anus). You have thin, pencil-like stools. Get help right away if: You have a fever and your symptoms  suddenly get worse. You leak stool or have blood in your stool. Your abdomen is bloated. You have severe pain in your abdomen. You feel dizzy or you faint. Summary Constipation is when a person has fewer than three bowel movements in a week, has difficulty having a bowel movement, or has stools (feces) that are dry, hard, or larger than normal. Eat foods that have a lot of fiber, such as beans, whole grains, and fresh fruits and vegetables. Drink enough fluid to keep your urine pale yellow. Take over-the-counter and prescription medicines only as told by your health care provider. This includes any fiber supplements. This information is not intended to replace advice given to you by your health care provider. Make sure you discuss any questions you have with your health care provider. Document Revised: 06/14/2019 Document Reviewed: 06/14/2019 Elsevier Patient Education  2023 [employer].

## 2022-04-16 NOTE — Progress Notes (Signed)
Opened in error

## 2022-04-21 ENCOUNTER — Ambulatory Visit: Payer: BC Managed Care – PPO | Admitting: Obstetrics and Gynecology

## 2022-04-22 ENCOUNTER — Encounter: Payer: Self-pay | Admitting: Family Medicine

## 2022-04-22 ENCOUNTER — Ambulatory Visit: Payer: BC Managed Care – PPO | Admitting: Family Medicine

## 2022-04-22 VITALS — BP 102/72 | HR 79 | Temp 98.3°F | Ht 67.5 in | Wt 225.4 lb

## 2022-04-22 DIAGNOSIS — L232 Allergic contact dermatitis due to cosmetics: Secondary | ICD-10-CM | POA: Diagnosis not present

## 2022-04-22 NOTE — Progress Notes (Signed)
OFFICE VISIT  04/22/2022  CC:  Chief Complaint  Patient presents with   Follow-up    rash    Patient is a 51 y.o. female who presents for 1 week follow-up rash. A/P as of last visit: "Rash, unknown etiology.   We will treat as an inflammatory process: Prednisone 20 mg a day x3 days, then 10 mg a day x3 days.  Recommended no topical medication. Discussed possible need for biopsy if not improved at follow-up visit. Check CBC with differential today."  INTERIM HX: Kristie Baldwin is doing much better. 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  These pictures match her rash perfectly. Just has a bit of pinkish hue that itches still on the medial aspect of right index finger.  She did have her cosmetologist remove her fake fingernails.  Past Medical History:  Diagnosis Date   Abnormal Pap smear of cervix    Depression    Dysmenorrhea    Peptic ulcer    STD (sexually transmitted disease)    HSV    History reviewed. No pertinent surgical history.  Outpatient Medications Prior to Visit  Medication Sig Dispense Refill   albuterol (VENTOLIN HFA) 108 (90 Base) MCG/ACT inhaler Inhale 2 puffs into the lungs every 6 (six) hours as needed for wheezing or shortness of breath. 8 g 3   Calcium Carb-Cholecalciferol (CALCIUM 1000 + D PO) Take by mouth.     Cholecalciferol (VITAMIN D3 PO) Take by mouth.     clindamycin (CLEOCIN T) 1 % external solution      COLLAGEN PO Take 1 tablet by mouth daily.     Cyanocobalamin (B-12 PO) Take by mouth.     estradiol (VIVELLE-DOT) 0.075 MG/24HR Place 1 patch onto the skin 2 (two) times a week. 8 patch 8   ibuprofen (ADVIL) 800 MG tablet Take 1 tablet (800 mg total) by mouth every 8 (eight) hours as needed. 30 tablet 0   pantoprazole (PROTONIX) 40 MG tablet TAKE 1 TABLET(40 MG) BY MOUTH TWICE DAILY BEFORE A MEAL 180 tablet 1   progesterone (PROMETRIUM) 200 MG capsule TAKE 1 CAPSULE(200 MG) BY MOUTH  AT BEDTIME. 90 capsule 3   valACYclovir (VALTREX) 500 MG tablet Take one tablet (500 mg) by mouth daily.   Take one tablet (500 mg) by mouth twice a day for 3 days as needed for an outbreak. 110 tablet 3   venlafaxine XR (EFFEXOR-XR) 37.5 MG 24 hr capsule Take 37.5 mg by mouth daily with breakfast.     predniSONE (DELTASONE) 10 MG tablet 2 tabs po qd x 3d, then 1 tab po qd x 3d (Patient not taking: Reported on 04/22/2022) 9 tablet 0   No facility-administered medications prior to visit.    Allergies  Allergen Reactions   Monistat [Miconazole] Other (See Comments)    Vaginal burning.    ROS As per HPI  PE:    04/22/2022    1:03 PM 04/16/2022   10:15 AM 04/15/2022   11:00 AM  Vitals with BMI  Height 5' 7.5" 5' 7.5" 5' 7.5"  Weight 225 lbs 6 oz 221 lbs 225 lbs 6 oz  BMI 34.76 34.08 34.76  Systolic 102 120 732  Diastolic 72 74 78  Pulse 79  100    Physical Exam  Gen: Alert, well appearing.  Patient is oriented to person, place, time, and situation. AFFECT: pleasant, lucid thought and speech. Skin: Fingers without erythema, swelling, or  tenderness. Nails are normal.  Subtle pinkish discoloration of medial aspect of right index finger distal phalanx. Slight fissure present here.   LABS:  Last CBC Lab Results  Component Value Date   WBC 8.1 04/15/2022   HGB 12.4 04/15/2022   HCT 38.1 04/15/2022   MCV 85.5 04/15/2022   MCH 27.4 05/02/2021   RDW 14.2 04/15/2022   PLT 317.0 04/15/2022    IMPRESSION AND PLAN:  Rash, allergic contact dermatitis reaction --acrylic. She is markedly improved with a course of prednisone. No further treatment necessary at this time. Discussed avoidance of acrylic and anything else on the nails.  An After Visit Summary was printed and given to the patient.  FOLLOW UP: Return if symptoms worsen or fail to improve.  Signed:  Santiago Bumpers, MD           04/22/2022

## 2022-04-28 ENCOUNTER — Encounter: Payer: Self-pay | Admitting: Family

## 2022-05-01 ENCOUNTER — Encounter: Payer: Self-pay | Admitting: Family Medicine

## 2022-05-01 ENCOUNTER — Ambulatory Visit: Payer: BC Managed Care – PPO | Admitting: Family Medicine

## 2022-05-01 VITALS — BP 130/82 | HR 113 | Temp 98.1°F | Resp 18 | Ht 67.0 in | Wt 229.2 lb

## 2022-05-01 DIAGNOSIS — J069 Acute upper respiratory infection, unspecified: Secondary | ICD-10-CM

## 2022-05-01 MED ORDER — PROMETHAZINE-DM 6.25-15 MG/5ML PO SYRP: 5.0000 mL | ORAL_SOLUTION | Freq: Four times a day (QID) | ORAL | 0 refills | Status: DC | PRN

## 2022-05-01 NOTE — Patient Instructions (Signed)
Continue to push fluids, practice good hand hygiene, and cover your mouth if you cough.  If you start having fevers, shaking or shortness of breath, seek immediate care.  OK to take Tylenol 1000 mg (2 extra strength tabs) or 975 mg (3 regular strength tabs) every 6 hours as needed.  The syrup can make you drowsy. Take 60-90 min before planned bedtime.  Send me a message if things are improving.   Let us know if you need anything.

## 2022-05-01 NOTE — Progress Notes (Signed)
Chief Complaint  Patient presents with   Sore Throat    Headache, couching, Fatigue  3 negative COVID test   Direct supervison /HS   skin check    Finger tips    Lauris Chroman here for URI complaints.  Duration: 5 days  Associated symptoms: sinus headache, sore throat, wheezing, shortness of breath, myalgia, and coughing Denies: sinus congestion, sinus pain, rhinorrhea, itchy watery eyes, ear pain, ear drainage, and fevers Treatment to date: Tylenol, Zycam  Sick contacts: Yes Tested neg for covid x 3.   Past Medical History:  Diagnosis Date   Abnormal Pap smear of cervix    Depression    Dysmenorrhea    Peptic ulcer    STD (sexually transmitted disease)    HSV    Objective BP 130/82   Pulse (!) 113   Temp 98.1 F (36.7 C) (Oral)   Resp 18   Ht 5\' 7"  (1.702 m)   Wt 229 lb 3.2 oz (104 kg)   LMP 10/05/2019 (Exact Date) Comment: 09-30-2020 bleeding  SpO2 100%   BMI 35.90 kg/m  General: Awake, alert, appears stated age 51: AT, [place], ears patent b/l and TM's neg, nares patent w/o discharge, pharynx pink and without exudates, MMM Neck: No masses or asymmetry Heart: Regular rhythm, tachycardic Lungs: CTAB, no accessory muscle use Psych: Age appropriate judgment and insight, normal mood and affect  Viral URI with cough - Plan: promethazine-dextromethorphan (PROMETHAZINE-DM) 6.25-15 MG/5ML syrup  Continue to push fluids, practice good hand hygiene, cover mouth when coughing. Warnings about syrup verbalized and written down.  She will send me a message if she does not improve. F/u prn. If starting to experience fevers, shaking, or shortness of breath, seek immediate care. Pt voiced understanding and agreement to the plan.  [place], DO 05/01/22 2:31 PM

## 2022-05-03 ENCOUNTER — Other Ambulatory Visit: Payer: Self-pay | Admitting: Family

## 2022-05-07 DIAGNOSIS — F329 Major depressive disorder, single episode, unspecified: Secondary | ICD-10-CM | POA: Diagnosis not present

## 2022-05-07 DIAGNOSIS — Z9189 Other specified personal risk factors, not elsewhere classified: Secondary | ICD-10-CM | POA: Diagnosis not present

## 2022-05-07 DIAGNOSIS — R519 Headache, unspecified: Secondary | ICD-10-CM | POA: Diagnosis not present

## 2022-05-07 DIAGNOSIS — Z780 Asymptomatic menopausal state: Secondary | ICD-10-CM | POA: Diagnosis not present

## 2022-05-07 DIAGNOSIS — K5900 Constipation, unspecified: Secondary | ICD-10-CM | POA: Diagnosis not present

## 2022-05-12 ENCOUNTER — Encounter: Payer: Self-pay | Admitting: Family

## 2022-05-12 ENCOUNTER — Ambulatory Visit: Payer: BC Managed Care – PPO | Admitting: Family

## 2022-05-12 VITALS — BP 136/78 | HR 65 | Temp 98.8°F | Ht 68.0 in | Wt 228.6 lb

## 2022-05-12 DIAGNOSIS — M7541 Impingement syndrome of right shoulder: Secondary | ICD-10-CM | POA: Diagnosis not present

## 2022-05-12 DIAGNOSIS — Z1211 Encounter for screening for malignant neoplasm of colon: Secondary | ICD-10-CM | POA: Diagnosis not present

## 2022-05-12 DIAGNOSIS — M9902 Segmental and somatic dysfunction of thoracic region: Secondary | ICD-10-CM | POA: Diagnosis not present

## 2022-05-12 DIAGNOSIS — M9901 Segmental and somatic dysfunction of cervical region: Secondary | ICD-10-CM | POA: Diagnosis not present

## 2022-05-12 DIAGNOSIS — J019 Acute sinusitis, unspecified: Secondary | ICD-10-CM

## 2022-05-12 DIAGNOSIS — M9903 Segmental and somatic dysfunction of lumbar region: Secondary | ICD-10-CM | POA: Diagnosis not present

## 2022-05-12 MED ORDER — AMOXICILLIN-POT CLAVULANATE 875-125 MG PO TABS
1.0000 | ORAL_TABLET | Freq: Two times a day (BID) | ORAL | 0 refills | Status: AC
Start: 2022-05-12 — End: 2022-05-22

## 2022-05-12 NOTE — Progress Notes (Signed)
Kristie Baldwin is a 51 y.o. female with the following history as recorded in [employer]:  Patient Active Problem List   Diagnosis Date Noted   Positive ANA (antinuclear antibody) 03/17/2022   Other fatigue 03/17/2022   Asthma 09/25/2021   Weight loss counseling, encounter for 12/17/2017   Obesity 06/04/2017    Current Outpatient Medications  Medication Sig Dispense Refill   albuterol (VENTOLIN HFA) 108 (90 Base) MCG/ACT inhaler Inhale 2 puffs into the lungs every 6 (six) hours as needed for wheezing or shortness of breath. 8 g 3   amoxicillin-clavulanate (AUGMENTIN) 875-125 MG tablet Take 1 tablet by mouth 2 (two) times daily for 10 days. 20 tablet 0   Calcium Carb-Cholecalciferol (CALCIUM 1000 + D PO) Take by mouth.     Cholecalciferol (VITAMIN D3 PO) Take by mouth.     clindamycin (CLEOCIN T) 1 % external solution      COLLAGEN PO Take 1 tablet by mouth daily.     Cyanocobalamin (B-12 PO) Take by mouth.     estradiol (VIVELLE-DOT) 0.075 MG/24HR Place 1 patch onto the skin 2 (two) times a week. 8 patch 8   ibuprofen (ADVIL) 800 MG tablet Take 1 tablet (800 mg total) by mouth every 8 (eight) hours as needed. 30 tablet 0   pantoprazole (PROTONIX) 40 MG tablet TAKE 1 TABLET(40 MG) BY MOUTH TWICE DAILY BEFORE A MEAL 180 tablet 1   progesterone (PROMETRIUM) 200 MG capsule TAKE 1 CAPSULE(200 MG) BY MOUTH AT BEDTIME. 90 capsule 3   promethazine-dextromethorphan (PROMETHAZINE-DM) 6.25-15 MG/5ML syrup Take 5 mLs by mouth 4 (four) times daily as needed for cough. 118 mL 0   valACYclovir (VALTREX) 500 MG tablet Take one tablet (500 mg) by mouth daily.   Take one tablet (500 mg) by mouth twice a day for 3 days as needed for an outbreak. 110 tablet 3   venlafaxine XR (EFFEXOR-XR) 37.5 MG 24 hr capsule Take 37.5 mg by mouth daily with breakfast.     No current facility-administered medications for this visit.    Allergies: Monistat [miconazole]  Past Medical History:  Diagnosis Date   Abnormal  Pap smear of cervix    Depression    Dysmenorrhea    Peptic ulcer    STD (sexually transmitted disease)    HSV    No past surgical history on file.  Family History  Problem Relation Age of Onset   Hypertension Mother    Arthritis Mother        OA   Atrial fibrillation Mother        medication   Heart Problems Father        pace maker 2018   Prostate cancer Father    Atrial fibrillation Father    Breast cancer Neg Hx    Colon cancer Neg Hx    Stomach cancer Neg Hx    Pancreatic cancer Neg Hx     Social History   Tobacco Use   Smoking status: Former    Packs/day: 0.25    Years: 0.00    Total pack years: 0.00    Types: Cigarettes    Quit date: 08/11/1991    Years since quitting: 30.7   Smokeless tobacco: Never   Tobacco comments:    Social smoker  Substance Use Topics   Alcohol use: Yes    Alcohol/week: 2.0 standard drinks of alcohol    Types: 2 Standard drinks or equivalent per week    Subjective:   Seen 2 weeks ago  with viral URI symptoms; concerned that symptoms are persisting; +cough; + frontal headache- sinus pain/ pressure;    Objective:  Vitals:   05/12/22 0949  BP: 136/78  Pulse: 65  Temp: 98.8 F (37.1 C)  TempSrc: Oral  SpO2: 99%  Weight: 228 lb 9.6 oz (103.7 kg)  Height: 5\' 8"  (1.727 m)    General: Well developed, well nourished, in no acute distress  Skin : Warm and dry.  Head: Normocephalic and atraumatic  Eyes: Sclera and conjunctiva clear; pupils round and reactive to light; extraocular movements intact  Ears: External normal; canals clear; tympanic membranes normal  Oropharynx: Pink, supple. No suspicious lesions  Neck: Supple without thyromegaly, adenopathy  Lungs: Respirations unlabored; clear to auscultation bilaterally without wheeze, rales, rhonchi  CVS exam: normal rate and regular rhythm.  Neurologic: Alert and oriented; speech intact; face symmetrical; moves all extremities well; CNII-XII intact without focal deficit    Assessment:  1. Acute sinusitis, recurrence not specified, unspecified location   2. Encounter for screening colonoscopy     Plan:  Rx for Augmentin 875 mg bid x 10 days; increase fluids, rest and follow up worse, no better.  Refer to GI for further evaluation;   No follow-ups on file.  Orders Placed This Encounter  Procedures   Ambulatory referral to Gastroenterology    Referral Priority:   Routine    Referral Type:   Consultation    Referral Reason:   Specialty Services Required    Referred to Provider:   , MD    Number of Visits Requested:   1    Requested Prescriptions   Signed Prescriptions Disp Refills   amoxicillin-clavulanate (AUGMENTIN) 875-125 MG tablet 20 tablet 0    Sig: Take 1 tablet by mouth 2 (two) times daily for 10 days.

## 2022-05-21 DIAGNOSIS — M9907 Segmental and somatic dysfunction of upper extremity: Secondary | ICD-10-CM | POA: Diagnosis not present

## 2022-05-21 DIAGNOSIS — M9901 Segmental and somatic dysfunction of cervical region: Secondary | ICD-10-CM | POA: Diagnosis not present

## 2022-05-21 DIAGNOSIS — M9902 Segmental and somatic dysfunction of thoracic region: Secondary | ICD-10-CM | POA: Diagnosis not present

## 2022-05-21 DIAGNOSIS — M7541 Impingement syndrome of right shoulder: Secondary | ICD-10-CM | POA: Diagnosis not present

## 2022-05-21 DIAGNOSIS — M9903 Segmental and somatic dysfunction of lumbar region: Secondary | ICD-10-CM | POA: Diagnosis not present

## 2022-05-28 DIAGNOSIS — F329 Major depressive disorder, single episode, unspecified: Secondary | ICD-10-CM | POA: Diagnosis not present

## 2022-05-28 DIAGNOSIS — G8929 Other chronic pain: Secondary | ICD-10-CM | POA: Diagnosis not present

## 2022-05-28 DIAGNOSIS — K5900 Constipation, unspecified: Secondary | ICD-10-CM | POA: Diagnosis not present

## 2022-05-28 DIAGNOSIS — J019 Acute sinusitis, unspecified: Secondary | ICD-10-CM | POA: Diagnosis not present

## 2022-06-16 ENCOUNTER — Other Ambulatory Visit: Payer: Self-pay | Admitting: Family

## 2022-06-16 ENCOUNTER — Ambulatory Visit: Payer: BC Managed Care – PPO | Admitting: Family

## 2022-06-16 ENCOUNTER — Encounter: Payer: Self-pay | Admitting: Family

## 2022-06-16 VITALS — BP 138/84 | HR 98 | Temp 98.1°F | Ht 68.0 in | Wt 234.8 lb

## 2022-06-16 DIAGNOSIS — R059 Cough, unspecified: Secondary | ICD-10-CM | POA: Diagnosis not present

## 2022-06-16 DIAGNOSIS — J019 Acute sinusitis, unspecified: Secondary | ICD-10-CM | POA: Diagnosis not present

## 2022-06-16 LAB — POC COVID19 BINAXNOW: SARS Coronavirus 2 Ag: NEGATIVE

## 2022-06-16 MED ORDER — BENZONATATE 100 MG PO CAPS: 100.0000 mg | ORAL_CAPSULE | Freq: Three times a day (TID) | ORAL | 0 refills | Status: DC | PRN

## 2022-06-16 MED ORDER — FLUTICASONE PROPIONATE 50 MCG/ACT NA SUSP: 2.0000 | Freq: Every day | NASAL | 6 refills | Status: DC

## 2022-06-16 MED ORDER — DOXYCYCLINE HYCLATE 100 MG PO TABS: 100.0000 mg | ORAL_TABLET | Freq: Two times a day (BID) | ORAL | 0 refills | Status: AC

## 2022-06-16 NOTE — Progress Notes (Signed)
Kristie Baldwin is a 51 y.o. female with the following history as recorded in [employer]:  Patient Active Problem List   Diagnosis Date Noted   Positive ANA (antinuclear antibody) 03/17/2022   Other fatigue 03/17/2022   Asthma 09/25/2021   Weight loss counseling, encounter for 12/17/2017   Obesity 06/04/2017    Current Outpatient Medications  Medication Sig Dispense Refill   albuterol (VENTOLIN HFA) 108 (90 Base) MCG/ACT inhaler Inhale 2 puffs into the lungs every 6 (six) hours as needed for wheezing or shortness of breath. 8 g 3   benzonatate (TESSALON) 100 MG capsule Take 1 capsule (100 mg total) by mouth 3 (three) times daily as needed. 20 capsule 0   Calcium Carb-Cholecalciferol (CALCIUM 1000 + D PO) Take by mouth.     Cholecalciferol (VITAMIN D3 PO) Take by mouth.     clindamycin (CLEOCIN T) 1 % external solution      COLLAGEN PO Take 1 tablet by mouth daily.     Cyanocobalamin (B-12 PO) Take by mouth.     doxycycline (VIBRA-TABS) 100 MG tablet Take 1 tablet (100 mg total) by mouth 2 (two) times daily for 10 days. 20 tablet 0   estradiol (VIVELLE-DOT) 0.075 MG/24HR Place 1 patch onto the skin 2 (two) times a week. 8 patch 8   fluticasone (FLONASE) 50 MCG/ACT nasal spray Place 2 sprays into both nostrils daily. 16 g 6   ibuprofen (ADVIL) 800 MG tablet Take 1 tablet (800 mg total) by mouth every 8 (eight) hours as needed. 30 tablet 0   pantoprazole (PROTONIX) 40 MG tablet TAKE 1 TABLET(40 MG) BY MOUTH TWICE DAILY BEFORE A MEAL 180 tablet 1   progesterone (PROMETRIUM) 200 MG capsule TAKE 1 CAPSULE(200 MG) BY MOUTH AT BEDTIME. 90 capsule 3   valACYclovir (VALTREX) 500 MG tablet Take one tablet (500 mg) by mouth daily.   Take one tablet (500 mg) by mouth twice a day for 3 days as needed for an outbreak. 110 tablet 3   venlafaxine XR (EFFEXOR-XR) 37.5 MG 24 hr capsule Take 37.5 mg by mouth daily with breakfast.     No current facility-administered medications for this visit.     Allergies: Monistat [miconazole]  Past Medical History:  Diagnosis Date   Abnormal Pap smear of cervix    Depression    Dysmenorrhea    Peptic ulcer    STD (sexually transmitted disease)    HSV    No past surgical history on file.  Family History  Problem Relation Age of Onset   Hypertension Mother    Arthritis Mother        OA   Atrial fibrillation Mother        medication   Heart Problems Father        pace maker 2018   Prostate cancer Father    Atrial fibrillation Father    Breast cancer Neg Hx    Colon cancer Neg Hx    Stomach cancer Neg Hx    Pancreatic cancer Neg Hx     Social History   Tobacco Use   Smoking status: Former    Packs/day: 0.25    Years: 0.00    Total pack years: 0.00    Types: Cigarettes    Quit date: 08/11/1991    Years since quitting: 30.8   Smokeless tobacco: Never   Tobacco comments:    Social smoker  Substance Use Topics   Alcohol use: Yes    Alcohol/week: 2.0 standard drinks of  alcohol    Types: 2 Standard drinks or equivalent per week    Subjective:  Patient presents with concerns for recurrent sinus infection; was treated with Augmentin in October; did feel like last infection cleared completely; did have dental work done last week prior to onset of symptoms- had 2 crowns placed last week;     Objective:  Vitals:   06/16/22 1306  BP: 138/84  Pulse: 98  Temp: 98.1 F (36.7 C)  TempSrc: Oral  SpO2: 99%  Weight: 234 lb 12.8 oz (106.5 kg)  Height: 5\' 8"  (1.727 m)    General: Well developed, well nourished, in no acute distress  Skin : Warm and dry.  Head: Normocephalic and atraumatic  Eyes: Sclera and conjunctiva clear; pupils round and reactive to light; extraocular movements intact  Ears: External normal; canals clear; tympanic membranes normal  Oropharynx: Pink, supple. No suspicious lesions  Neck: Supple without thyromegaly, adenopathy  Lungs: Respirations unlabored; clear to auscultation bilaterally without wheeze,  rales, rhonchi  CVS exam: normal rate and regular rhythm.  Neurologic: Alert and oriented; speech intact; face symmetrical; moves all extremities well; CNII-XII intact without focal deficit   Assessment:  1. Acute sinusitis, recurrence not specified, unspecified location   2. Cough, unspecified type     Plan:  ? If recurrence due to recent dental procedures; Rx for Doxycycline, Tessalon Perles and start Flonase; increase fluids, rest and follow up worse, no better; To consider ENT evaluation if symptoms persist after dental work completed.   No follow-ups on file.  Orders Placed This Encounter  Procedures   POC COVID-19    Order Specific Question:   Previously tested for COVID-19    Answer:   Yes    Order Specific Question:   Resident in a congregate (group) care setting    Answer:   No    Order Specific Question:   Employed in healthcare setting    Answer:   No    Order Specific Question:   Pregnant    Answer:   No    Requested Prescriptions   Signed Prescriptions Disp Refills   doxycycline (VIBRA-TABS) 100 MG tablet 20 tablet 0    Sig: Take 1 tablet (100 mg total) by mouth 2 (two) times daily for 10 days.   fluticasone (FLONASE) 50 MCG/ACT nasal spray 16 g 6    Sig: Place 2 sprays into both nostrils daily.   benzonatate (TESSALON) 100 MG capsule 20 capsule 0    Sig: Take 1 capsule (100 mg total) by mouth 3 (three) times daily as needed.

## 2022-06-24 ENCOUNTER — Encounter: Payer: Self-pay | Admitting: Family

## 2022-07-01 ENCOUNTER — Encounter: Payer: Self-pay | Admitting: Family

## 2022-07-01 ENCOUNTER — Other Ambulatory Visit: Payer: Self-pay | Admitting: Family

## 2022-07-01 DIAGNOSIS — R0989 Other specified symptoms and signs involving the circulatory and respiratory systems: Secondary | ICD-10-CM

## 2022-07-08 ENCOUNTER — Encounter (HOSPITAL_COMMUNITY): Payer: Self-pay

## 2022-07-08 ENCOUNTER — Emergency Department (HOSPITAL_COMMUNITY)
Admission: EM | Admit: 2022-07-08 | Discharge: 2022-07-08 | Disposition: A | Discharge status: Home / Self Care | Payer: BC Managed Care – PPO | Attending: Emergency Medicine | Admitting: Emergency Medicine

## 2022-07-08 ENCOUNTER — Emergency Department (HOSPITAL_COMMUNITY): Payer: BC Managed Care – PPO

## 2022-07-08 ENCOUNTER — Other Ambulatory Visit: Payer: Self-pay

## 2022-07-08 DIAGNOSIS — R0981 Nasal congestion: Secondary | ICD-10-CM | POA: Diagnosis not present

## 2022-07-08 DIAGNOSIS — Z1152 Encounter for screening for COVID-19: Secondary | ICD-10-CM | POA: Diagnosis not present

## 2022-07-08 DIAGNOSIS — J45909 Unspecified asthma, uncomplicated: Secondary | ICD-10-CM | POA: Diagnosis not present

## 2022-07-08 DIAGNOSIS — Z7951 Long term (current) use of inhaled steroids: Secondary | ICD-10-CM | POA: Diagnosis not present

## 2022-07-08 DIAGNOSIS — R059 Cough, unspecified: Secondary | ICD-10-CM | POA: Diagnosis not present

## 2022-07-08 DIAGNOSIS — J399 Disease of upper respiratory tract, unspecified: Secondary | ICD-10-CM | POA: Insufficient documentation

## 2022-07-08 DIAGNOSIS — R000 Tachycardia, unspecified: Secondary | ICD-10-CM | POA: Diagnosis not present

## 2022-07-08 DIAGNOSIS — S2231XA Fracture of one rib, right side, initial encounter for closed fracture: Secondary | ICD-10-CM | POA: Diagnosis not present

## 2022-07-08 DIAGNOSIS — J398 Other specified diseases of upper respiratory tract: Secondary | ICD-10-CM

## 2022-07-08 LAB — RESP PANEL BY RT-PCR (FLU A&B, COVID) ARPGX2
Influenza A by PCR: NEGATIVE
Influenza B by PCR: NEGATIVE
SARS Coronavirus 2 by RT PCR: NEGATIVE

## 2022-07-08 MED ORDER — CETIRIZINE HCL 10 MG PO TABS: 10.0000 mg | ORAL_TABLET | Freq: Every day | ORAL | 0 refills | Status: DC

## 2022-07-08 MED ORDER — GUAIFENESIN 200 MG PO TABS: 200.0000 mg | ORAL_TABLET | ORAL | 0 refills | Status: DC | PRN

## 2022-07-08 NOTE — ED Notes (Signed)
ED PA at [facility] 

## 2022-07-08 NOTE — ED Notes (Signed)
Pt seen by EDPA prior to RN assessment, see PA notes, orders received to d/c. D/c delay d/t high volume influx of pts.

## 2022-07-08 NOTE — ED Triage Notes (Signed)
Pt arrived to triage complaining of cough, body aches and sinus problems since the beginning of September.  States that she has felt feverish but hasn't had any fevers when she checks

## 2022-07-08 NOTE — ED Provider Triage Note (Signed)
Emergency Medicine Provider Triage Evaluation Note  Kristie Baldwin , a 51 y.o. female  was evaluated in triage.  Pt complains of congestion, cough, and sinus issues for 2 months.  Has seen PCP and taken 3 different antibiotics, however as soon as she comes off the medication her symptoms return.  Awaiting ENT referral for further eval but feeling "miserable" today.  Review of Systems  Positive: Congestion, cough Negative: fever  Physical Exam  BP 111/81 (BP Location: Right Arm)   Pulse (!) 117   Temp 98.3 F (36.8 C)   Resp 20   LMP 10/05/2019 (Exact Date) Comment: 09-30-2020 bleeding  SpO2 99%   Gen:   Awake, no distress   Resp:  Normal effort, dry cough on exam MSK:   Moves extremities without difficulty  Other:    Medical Decision Making  Medically screening exam initiated at 5:10 AM.  Appropriate orders placed.  Kristie Baldwin was informed that the remainder of the evaluation will be completed by another provider, this initial triage assessment does not replace that evaluation, and the importance of remaining in the ED until their evaluation is complete.  Cough, congestion x2 months.  Covid/flu screen, CXR.   Garlon Hatchet, PA-C 07/08/22 [phone]

## 2022-07-08 NOTE — Discharge Instructions (Addendum)
You have been evaluated for your symptoms.  Your COVID and flu test came back negative, your chest x-ray did not show any signs of pneumonia.  Your symptoms could also be due to allergies so therefore please take Zyrtec, and guaifenesin for symptom control.  Follow-up with your doctor or with the ENT provider for further assessment.  If you develop significant shortness of breath, coughing up blood, or having fever please return to the ER for further assessment.

## 2022-07-08 NOTE — ED Provider Notes (Signed)
[facility] EMERGENCY DEPARTMENT Provider Note   CSN: [id number] Arrival date & time: 07/08/22  4098     History  Chief Complaint  Patient presents with   Cough   Generalized Body Aches    Kristie Baldwin is a 51 y.o. female.  The history is provided by the patient and medical records. No language interpreter was used.  Cough    51 year old female significant history of obesity, asthma, peptic ulcer who presents with cold symptoms.  Patient report for the past 2 to 3 months she has had persistent cough productive with occasional white phlegm, body aches, sinus congestion, chest congestion, and and overall not feeling well.  She has been seen and evaluated by her PCP multiple times in the past and has been on multiple antibiotic and steroid without adequate relief.  She has had numerous negative COVID test at home.  She was told to follow-up with an ENT provider for her sinus problem but have not had an appointment for it yet.  Symptoms still persist despite taking multiple over-the-counter medication without relief thus prompting this ER visit.  She does not endorse any prior history of PE or DVT no recent surgery prolonged bedrest active cancer hemoptysis.  She denies having any fever.  She has taken Augmentin in the past as well as doxycycline in the past and noted some mild improvement while on the antibiotic but as soon as the antibiotic wears off, her symptoms returned.  Home Medications Prior to Admission medications   Medication Sig Start Date End Date Taking? Authorizing Provider  albuterol (VENTOLIN HFA) 108 (90 Base) MCG/ACT inhaler Inhale 2 puffs into the lungs every 6 (six) hours as needed for wheezing or shortness of breath. 08/15/21   Olalere, Minna Antis, MD  benzonatate (TESSALON) 100 MG capsule Take 1 capsule (100 mg total) by mouth 3 (three) times daily as needed. 06/16/22   Olive Bass, FNP  Calcium Carb-Cholecalciferol (CALCIUM 1000 + D  PO) Take by mouth.    [provider]  Cholecalciferol (VITAMIN D3 PO) Take by mouth.    [provider]  clindamycin (CLEOCIN T) 1 % external solution  10/20/21   [provider]  COLLAGEN PO Take 1 tablet by mouth daily.    [provider]  Cyanocobalamin (B-12 PO) Take by mouth.    [provider]  estradiol (VIVELLE-DOT) 0.075 MG/24HR Place 1 patch onto the skin 2 (two) times a week. 11/20/21   Patton Salles, MD  fluticasone (FLONASE) 50 MCG/ACT nasal spray Place 2 sprays into both nostrils daily. 06/16/22   Olive Bass, FNP  ibuprofen (ADVIL) 800 MG tablet Take 1 tablet (800 mg total) by mouth every 8 (eight) hours as needed. 04/15/22   Patton Salles, MD  pantoprazole (PROTONIX) 40 MG tablet TAKE 1 TABLET(40 MG) BY MOUTH TWICE DAILY BEFORE A MEAL 05/04/22   Olive Bass, FNP  progesterone (PROMETRIUM) 200 MG capsule TAKE 1 CAPSULE(200 MG) BY MOUTH AT BEDTIME. 08/20/21   Ardell Isaacs, Forrestine Him, MD  valACYclovir (VALTREX) 500 MG tablet Take one tablet (500 mg) by mouth daily.   Take one tablet (500 mg) by mouth twice a day for 3 days as needed for an outbreak. 08/20/21   Patton Salles, MD  venlafaxine XR (EFFEXOR-XR) 37.5 MG 24 hr capsule Take 37.5 mg by mouth daily with breakfast.    [provider]  Allergies    Monistat [miconazole]    Review of Systems   Review of Systems  Respiratory:  Positive for cough.   All other systems reviewed and are negative.   Physical Exam Updated Vital Signs BP 111/81 (BP Location: Right Arm)   Pulse (!) 117   Temp 98.3 F (36.8 C)   Resp 20   Ht 5\' 8"  (1.727 m)   Wt 99.8 kg   LMP 10/05/2019 (Exact Date) Comment: 09-30-2020 bleeding  SpO2 99%   BMI 33.45 kg/m  Physical Exam Vitals and nursing note reviewed.  Constitutional:      General: She is not in acute distress.    Appearance: She is well-developed. She is obese.  HENT:      Head: Atraumatic.     Nose: Nose normal.     Mouth/Throat:     Mouth: Mucous membranes are moist.  Eyes:     Conjunctiva/sclera: Conjunctivae normal.  Cardiovascular:     Rate and Rhythm: Tachycardia present.     Pulses: Normal pulses.     Heart sounds: Normal heart sounds.  Pulmonary:     Effort: Pulmonary effort is normal.     Breath sounds: Normal breath sounds. No wheezing, rhonchi or rales.  Abdominal:     Palpations: Abdomen is soft.     Tenderness: There is no abdominal tenderness.  Musculoskeletal:     Cervical back: Neck supple.  Skin:    Findings: No rash.  Neurological:     Mental Status: She is alert.  Psychiatric:        Mood and Affect: Mood normal.     ED Results / Procedures / Treatments   Labs (all labs ordered are listed, but only abnormal results are displayed) Labs Reviewed  RESP PANEL BY RT-PCR (FLU A&B, COVID) [id number]    EKG None  Radiology DG Chest 2 View  Result Date: 07/08/2022 CLINICAL DATA:  Cough and body aches EXAM: CHEST - 2 VIEW COMPARISON:  05/27/2021 FINDINGS: Chronic interstitial coarsening. Mild linear scarring behind the heart. There is no edema, consolidation, effusion, or pneumothorax. Normal heart size and mediastinal contours. Remote right rib fracture. IMPRESSION: No acute finding when compared to prior. Electronically Signed   By: 05/29/2021 M.D.   On: 07/08/2022 05:33    Procedures Procedures    Medications Ordered in ED Medications - No data to display  ED Course/ Medical Decision Making/ A&P                           Medical Decision Making  BP 111/81 (BP Location: Right Arm)   Pulse (!) 117   Temp 98.3 F (36.8 C)   Resp 20   Ht 5\' 8"  (1.727 m)   Wt 99.8 kg   LMP 10/05/2019 (Exact Date) Comment: 09-30-2020 bleeding  SpO2 99%   BMI 33.45 kg/m   35:50 AM 51 year old female significant history of obesity, asthma, peptic ulcer who presents with cold symptoms.  Patient report for the past 2 to 3 months she  has had persistent cough productive with occasional white phlegm, body aches, sinus congestion, chest congestion, and and overall not feeling well.  She has been seen and evaluated by her PCP multiple times in the past and has been on multiple antibiotic and steroid without adequate relief.  She has had numerous negative COVID test at home.  She was told to follow-up with an ENT provider for her sinus problem but have  not had an appointment for it yet.  Symptoms still persist despite taking multiple over-the-counter medication without relief thus prompting this ER visit.  She does not endorse any prior history of PE or DVT no recent surgery prolonged bedrest active cancer hemoptysis.  She denies having any fever.  She has taken Augmentin in the past as well as doxycycline in the past and noted some mild improvement while on the antibiotic but as soon as the antibiotic wears off, her symptoms returned.  On exam, this is an obese female laying in bed appears to be in no acute discomfort.  Ear nose and throat unremarkable on exam.  Heart and lung sounds normal.  Mild tachycardia noted.  Vital signs remarkable for initial heart rate of 117 however on recheck, her heart rate is 101.  No hypoxia O2 sat is 99% on room air.  -Labs ordered, independently viewed and interpreted by me.  Labs remarkable for negative covid and flu test -The patient was maintained on a cardiac monitor.  I personally viewed and interpreted the cardiac monitored which showed an underlying rhythm of: sinus tachycardia -Imaging independently viewed and interpreted by me and I agree with radiologist's interpretation.  Result remarkable for CXR showing no acute finding -This patient presents to the ED for concern of cold sxs, this involves an extensive number of treatment options, and is a complaint that carries with it a high risk of complications and morbidity.  The differential diagnosis includes covid, flu, viral illness, GERD, allergy, PE,  pna -Co morbidities that complicate the patient evaluation includes asthma -Treatment includes none -Reevaluation of the patient after these medicines showed that the patient stayed the same -PCP office notes or outside notes reviewed -Escalation to admission/observation considered: patients feels much better, is comfortable with discharge, and will follow up with PCP -Prescription medication considered, patient comfortable with zyrtec -Social Determinant of Health considered   8:46 AM Patient here with cold symptoms ongoing for the past 2 to 3 months.  She has had several different types of antibiotic prescribed by her PCP without improvement of her symptoms.  She was told to follow-up with an ENT provider for her sinus symptoms but have not had an appointment yet.  She is here today with concerns about symptoms.  On exam this is a well-appearing female appears to be in no acute discomfort.  Her exam is fairly unremarkable.  Initially she has a vital sign with a heart rate of 117 however on recheck it is 101.  COVID flu test came back negative, chest x-ray is unremarkable.  Although PE is in the differential my suspicion is low and after discussion with patient we felt additional workup is not indicated.  She does have sneezing coughing congestion which makes allergy is a possibility.  Will prescribe Zyrtec.  She has tried Flonase in the past without relief.  She has been on several different antibiotic I do not think additional antibiotic is indicated.  I gave patient return precaution.  Patient voiced understanding and agrees with plan.         Final Clinical Impression(s) / ED Diagnoses Final diagnoses:  Congestion of upper respiratory tract    Rx / DC Orders ED Discharge Orders          Ordered    guaiFENesin 200 MG tablet  Every 4 hours PRN        07/08/22 0848    cetirizine (ZYRTEC ALLERGY) 10 MG tablet  Daily  07/08/22 0848              Fayrene Helper,  PA-C 07/08/22 0569    Glyn Ade, MD 07/09/22 1054

## 2022-07-10 ENCOUNTER — Other Ambulatory Visit: Payer: Self-pay | Admitting: Family

## 2022-07-29 ENCOUNTER — Other Ambulatory Visit: Payer: Self-pay | Admitting: Obstetrics and Gynecology

## 2022-07-29 DIAGNOSIS — Z79890 Hormone replacement therapy (postmenopausal): Secondary | ICD-10-CM

## 2022-07-29 NOTE — Telephone Encounter (Signed)
Last AEX 08/20/2021--scheduled for 08/24/2022 Last mammo 11/20/2021-neg birads 1

## 2022-08-12 NOTE — Progress Notes (Signed)
52 y.o. G58P0010 Married Caucasian female here for annual exam.    Having an HSV outbreak.   Good control of hot flashes on her HRT. Wants to continue.   PCP prescribing Effexor.   PCP:   Jodi Mourning, FNP  Patient's last menstrual period was 10/05/2019 (exact date).           Sexually active: Yes.    The current method of family planning is post menopausal status.    Exercising: walking on the treadmill Smoker:  no  Health Maintenance: Pap:  11/12/20 negative: HR HPV negative, 09-16-15 Neg:Neg HR HPV History of abnormal Pap:  yes, remote hx MMG:  11/20/21 Breast Density Category B, BI-RADS CATEGORY 1 Negative Colonoscopy:  never.  She will contact GI office back to schedule. They have already contacted her.  BMD:   n/a  Result  n/a TDaP:  2013 - declined.  Gardasil:   no HIV: neg years ago Hep C: neg years ago Screening Labs:  PCP Flu vaccine:  discussed.  Covid vaccine:  discussed.   reports that she quit smoking about 31 years ago. Her smoking use included cigarettes. She smoked an average of 0.25 packs per day. She has never used smokeless tobacco. She reports current alcohol use of about 2.0 standard drinks of alcohol per week. She reports that she does not use drugs.  Past Medical History:  Diagnosis Date   Abnormal Pap smear of cervix    Depression    Dysmenorrhea    Peptic ulcer    STD (sexually transmitted disease)    HSV    History reviewed. No pertinent surgical history.  Current Outpatient Medications  Medication Sig Dispense Refill   albuterol (VENTOLIN HFA) 108 (90 Base) MCG/ACT inhaler Inhale 2 puffs into the lungs every 6 (six) hours as needed for wheezing or shortness of breath. 8 g 3   Calcium Carb-Cholecalciferol (CALCIUM 1000 + D PO) Take by mouth.     Cholecalciferol (VITAMIN D3 PO) Take by mouth.     clindamycin (CLEOCIN T) 1 % external solution      COLLAGEN PO Take 1 tablet by mouth daily.     Cyanocobalamin (B-12 PO) Take by mouth.      estradiol (VIVELLE-DOT) 0.075 MG/24HR APPLY 1 PATCH TOPICALLY TO THE SKIN 2 TIMES A WEEK 8 patch 0   ibuprofen (ADVIL) 800 MG tablet Take 1 tablet (800 mg total) by mouth every 8 (eight) hours as needed. 30 tablet 0   pantoprazole (PROTONIX) 40 MG tablet TAKE 1 TABLET(40 MG) BY MOUTH TWICE DAILY BEFORE A MEAL 180 tablet 1   progesterone (PROMETRIUM) 200 MG capsule TAKE 1 CAPSULE(200 MG) BY MOUTH AT BEDTIME. 90 capsule 3   valACYclovir (VALTREX) 500 MG tablet Take one tablet (500 mg) by mouth daily.   Take one tablet (500 mg) by mouth twice a day for 3 days as needed for an outbreak. 110 tablet 3   venlafaxine XR (EFFEXOR-XR) 37.5 MG 24 hr capsule Take 37.5 mg by mouth daily with breakfast.     No current facility-administered medications for this visit.    Family History  Problem Relation Age of Onset   Hypertension Mother    Arthritis Mother        OA   Atrial fibrillation Mother        medication   Heart Problems Father        pace maker 2018   Prostate cancer Father    Atrial fibrillation Father  Breast cancer Neg Hx    Colon cancer Neg Hx    Stomach cancer Neg Hx    Pancreatic cancer Neg Hx     Review of Systems  All other systems reviewed and are negative.   Exam:   BP 126/82 (BP Location: Right Arm, Patient Position: Sitting, Cuff Size: Large)   Pulse 92   Ht 5\' 8"  (1.727 m)   Wt 234 lb (106.1 kg)   LMP 10/05/2019 (Exact Date) Comment: 09-30-2020 bleeding  SpO2 98%   BMI 35.58 kg/m     General appearance: alert, cooperative and appears stated age Head: normocephalic, without obvious abnormality, atraumatic Neck: no adenopathy, supple, symmetrical, trachea midline and thyroid normal to inspection and palpation Lungs: clear to auscultation bilaterally Breasts: normal appearance, no masses or tenderness, No nipple retraction or dimpling, No nipple discharge or bleeding, No axillary adenopathy Heart: regular rate and rhythm Abdomen: soft, non-tender; no masses, no  organomegaly Extremities: extremities normal, atraumatic, no cyanosis or edema Skin: skin color, texture, turgor normal. No rashes or lesions Lymph nodes: cervical, supraclavicular, and axillary nodes normal. Neurologic: grossly normal  Pelvic: External genitalia:  right labia majora with raised ulcerated area.              No abnormal inguinal nodes palpated.              Urethra:  normal appearing urethra with no masses, tenderness or lesions              Bartholins and Skenes: normal                 Vagina: normal appearing vagina with normal color and discharge, no lesions              Cervix: no lesions              Pap taken: no Bimanual Exam:  Uterus:  normal size, contour, position, consistency, mobility, non-tender              Adnexa: no mass, fullness, tenderness              Rectal exam: yes.  Confirms.              Anus:  normal sphincter tone, no lesions  Chaperone was present for exam:  Emily  Assessment:   Well woman visit with gynecologic exam. HRT.  HSV.   Depression.  On Effexor.  Colon cancer screening.   Plan: Mammogram screening discussed. Self breast awareness reviewed. Pap and HR HPV 2027. Guidelines for Calcium, Vitamin D, regular exercise program including cardiovascular and weight bearing exercise. Discused WHI and use of HRT which can increase risk of PE, DVT, MI, stroke and breast cancer.  Refill of Vivelle Dot 0.075 mg twice weekly and Prometrium 200 mg q hs for one year.  Refill of Valtrex 500 mg po bid x 3 days prn ourbreak.  Follow up annually and prn.   After visit summary provided.

## 2022-08-21 ENCOUNTER — Ambulatory Visit: Payer: BC Managed Care – PPO | Admitting: Obstetrics and Gynecology

## 2022-08-24 ENCOUNTER — Ambulatory Visit (INDEPENDENT_AMBULATORY_CARE_PROVIDER_SITE_OTHER): Payer: BC Managed Care – PPO | Admitting: Obstetrics and Gynecology

## 2022-08-24 ENCOUNTER — Encounter: Payer: Self-pay | Admitting: Obstetrics and Gynecology

## 2022-08-24 DIAGNOSIS — Z01419 Encounter for gynecological examination (general) (routine) without abnormal findings: Secondary | ICD-10-CM | POA: Diagnosis not present

## 2022-08-24 DIAGNOSIS — Z8619 Personal history of other infectious and parasitic diseases: Secondary | ICD-10-CM

## 2022-08-24 DIAGNOSIS — Z79890 Hormone replacement therapy (postmenopausal): Secondary | ICD-10-CM

## 2022-08-24 DIAGNOSIS — B009 Herpesviral infection, unspecified: Secondary | ICD-10-CM | POA: Diagnosis not present

## 2022-08-24 MED ORDER — VALACYCLOVIR HCL 500 MG PO TABS: ORAL_TABLET | ORAL | 2 refills | Status: DC

## 2022-08-24 MED ORDER — ESTRADIOL 0.075 MG/24HR TD PTTW: MEDICATED_PATCH | TRANSDERMAL | 11 refills | Status: DC

## 2022-08-24 MED ORDER — PROGESTERONE 200 MG PO CAPS: ORAL_CAPSULE | ORAL | 11 refills | Status: DC

## 2022-08-24 NOTE — Patient Instructions (Signed)

## 2022-09-09 ENCOUNTER — Encounter: Payer: Self-pay | Admitting: Obstetrics and Gynecology

## 2022-09-09 NOTE — Telephone Encounter (Signed)
Staff msg sent to appt desk to contact pt to schedule.

## 2022-09-09 NOTE — Telephone Encounter (Signed)
Please have patient make an appointment to see me.  We need to understand the vaginal discharge further.   She will need an office visit.

## 2022-09-10 NOTE — Progress Notes (Signed)
GYNECOLOGY  VISIT   HPI: 52 y.o.   Married  Caucasian  female   G1P0010 with Patient's last menstrual period was 10/05/2019 (exact date).   here for   post menopausal bleeding. Pt noticed dark brown discharge on the 30th. The following two days pt reported more spotting but has not noticed since.  She was having some cramping as well.  No missed doses of HRT.   No partner change.  Intercourse did not precede the bleeding.   GYNECOLOGIC HISTORY: Patient's last menstrual period was 10/05/2019 (exact date). Contraception:  PMP Menopausal hormone therapy:  estradiol Last mammogram:  11/20/21 Breast Density Category B, BI-RADS CATEGORY 1 Neg Last pap smear:   11/12/20 neg: HR HPV neg        OB History     Gravida  1   Para  0   Term  0   Preterm  0   AB  1   Living  0      SAB  0   IAB  1   Ectopic  0   Multiple  0   Live Births  0              Patient Active Problem List   Diagnosis Date Noted   Positive ANA (antinuclear antibody) 03/17/2022   Other fatigue 03/17/2022   Asthma 09/25/2021   Weight loss counseling, encounter for 12/17/2017   Obesity 06/04/2017    Past Medical History:  Diagnosis Date   Abnormal Pap smear of cervix    Depression    Dysmenorrhea    Peptic ulcer    STD (sexually transmitted disease)    HSV    History reviewed. No pertinent surgical history.  Current Outpatient Medications  Medication Sig Dispense Refill   albuterol (VENTOLIN HFA) 108 (90 Base) MCG/ACT inhaler Inhale 2 puffs into the lungs every 6 (six) hours as needed for wheezing or shortness of breath. 8 g 3   Calcium Carb-Cholecalciferol (CALCIUM 1000 + D PO) Take by mouth.     Cholecalciferol (VITAMIN D3 PO) Take by mouth.     clindamycin (CLEOCIN T) 1 % external solution      COLLAGEN PO Take 1 tablet by mouth daily.     Cyanocobalamin (B-12 PO) Take by mouth.     estradiol (VIVELLE-DOT) 0.075 MG/24HR APPLY 1 PATCH TOPICALLY TO THE SKIN 2 TIMES A WEEK 8 patch 11    ibuprofen (ADVIL) 800 MG tablet Take 1 tablet (800 mg total) by mouth every 8 (eight) hours as needed. 30 tablet 0   pantoprazole (PROTONIX) 40 MG tablet TAKE 1 TABLET(40 MG) BY MOUTH TWICE DAILY BEFORE A MEAL 180 tablet 1   progesterone (PROMETRIUM) 200 MG capsule TAKE 1 CAPSULE(200 MG) BY MOUTH AT BEDTIME. 30 capsule 11   valACYclovir (VALTREX) 500 MG tablet Take one tablet (500 mg) by mouth twice a day for 3 days as needed for an outbreak. 30 tablet 2   venlafaxine XR (EFFEXOR-XR) 37.5 MG 24 hr capsule Take 37.5 mg by mouth daily with breakfast.     No current facility-administered medications for this visit.     ALLERGIES: Monistat [miconazole]  Family History  Problem Relation Age of Onset   Hypertension Mother    Arthritis Mother        OA   Atrial fibrillation Mother        medication   Heart Problems Father        pace maker 2018   Prostate cancer Father  Atrial fibrillation Father    Breast cancer Neg Hx    Colon cancer Neg Hx    Stomach cancer Neg Hx    Pancreatic cancer Neg Hx     Social History   Socioeconomic History   Marital status: Married    Spouse name: Not on file   Number of children: 0   Years of education: Not on file   Highest education level: Not on file  Occupational History    Employer: [employer]  Tobacco Use   Smoking status: Former    Packs/day: 0.25    Years: 0.00    Total pack years: 0.00    Types: Cigarettes    Quit date: 08/11/1991    Years since quitting: 31.1   Smokeless tobacco: Never   Tobacco comments:    Social smoker  Vaping Use   Vaping Use: Never used  Substance and Sexual Activity   Alcohol use: Yes    Alcohol/week: 2.0 standard drinks of alcohol    Types: 2 Standard drinks or equivalent per week   Drug use: No   Sexual activity: Yes    Partners: Male    Birth control/protection: Post-menopausal  Other Topics Concern   Not on file  Social History Narrative   Not on file   Social Determinants of Health    Financial Resource Strain: Low Risk  (04/15/2022)   Overall Financial Resource Strain (CARDIA)    Difficulty of Paying Living Expenses: Not hard at all  Food Insecurity: No Food Insecurity (04/15/2022)   Hunger Vital Sign    Worried About Running Out of Food in the Last Year: Never true    [place] in the Last Year: Never true  Transportation Needs: No Transportation Needs (04/15/2022)   PRAPARE - Hydrologist (Medical): No    Lack of Transportation (Non-Medical): No  Physical Activity: Unknown (04/15/2022)   Exercise Vital Sign    Days of Exercise per Week: Patient refused    Minutes of Exercise per Session: Not on file  Stress: No Stress Concern Present (04/15/2022)   Sisters    Feeling of Stress : Not at all  Social Connections: Unknown (04/15/2022)   Social Connection and Isolation Panel [NHANES]    Frequency of Communication with Friends and Family: Patient refused    Frequency of Social Gatherings with Friends and Family: Patient refused    Attends Religious Services: Patient refused    Marine scientist or Organizations: Patient refused    Attends Music therapist: Not on file    Marital Status: Married  Human resources officer Violence: Not on file    Review of Systems  All other systems reviewed and are negative.   PHYSICAL EXAMINATION:    BP 122/82 (BP Location: Right Arm, Patient Position: Sitting, Cuff Size: Large)   Pulse 95   Ht 5' 7"$  (1.702 m)   Wt 234 lb (106.1 kg)   LMP 10/05/2019 (Exact Date) Comment: 09-30-2020 bleeding  SpO2 98%   BMI 36.65 kg/m     General appearance: alert, cooperative and appears stated age   Pelvic: External genitalia:  no lesions              Urethra:  normal appearing urethra with no masses, tenderness or lesions              Bartholins and Skenes: normal  Vagina: normal appearing vagina with normal color  and discharge, no lesions              Cervix: no lesions                Bimanual Exam:  Uterus:  normal size, contour, position, consistency, mobility, non-tender              Adnexa: no mass, fullness, tenderness           Chaperone was present for exam:  Santiago Glad  ASSESSMENT  Postmenopausal bleeding.  On HRT. Cervical cancer screening.   PLAN  We discussed postmenopausal bleeding and etiologies - atrophy, HRT, infection, polyps, precancer and cancer.  Pap and HR HPV collected.  Return of pelvic US and potential EMB.   An After Visit Summary was printed and given to the patient.  22 min  total time was spent for this patient encounter, including preparation, face-to-face counseling with the patient, coordination of care, and documentation of the encounter.

## 2022-09-10 NOTE — Telephone Encounter (Signed)
FYI. Pt scheduled for 09/22/2022. 

## 2022-09-22 ENCOUNTER — Encounter: Payer: Self-pay | Admitting: Obstetrics and Gynecology

## 2022-09-22 ENCOUNTER — Other Ambulatory Visit (HOSPITAL_COMMUNITY)
Admission: RE | Admit: 2022-09-22 | Discharge: 2022-09-22 | Disposition: A | Discharge status: Home / Self Care | Payer: BC Managed Care – PPO | Source: Ambulatory Visit | Attending: Obstetrics and Gynecology | Admitting: Obstetrics and Gynecology

## 2022-09-22 ENCOUNTER — Ambulatory Visit: Payer: BC Managed Care – PPO | Admitting: Obstetrics and Gynecology

## 2022-09-22 VITALS — BP 122/82 | HR 95 | Ht 67.0 in | Wt 234.0 lb

## 2022-09-22 DIAGNOSIS — Z124 Encounter for screening for malignant neoplasm of cervix: Secondary | ICD-10-CM | POA: Insufficient documentation

## 2022-09-22 DIAGNOSIS — N950 Postmenopausal bleeding: Secondary | ICD-10-CM | POA: Insufficient documentation

## 2022-09-22 NOTE — Patient Instructions (Signed)
Postmenopausal Bleeding Postmenopausal bleeding is any bleeding that a woman has after she has entered menopause. Menopause is the end of a woman's fertile years. After menopause, a woman no longer ovulates and does not have menstrual periods. Therefore, she should no longer have bleeding from her vagina. Postmenopausal bleeding may have various causes, including: Menopausal hormone therapy (MHT). Endometrial atrophy. After menopause, low estrogen hormone levels cause the membrane that lines the uterus (endometrium) to become thin. You may have bleeding as the endometrium thins. Endometrial hyperplasia. This condition is caused by excess estrogen hormones and low levels of progesterone hormones. The excess estrogen causes the endometrium to thicken, which can lead to bleeding. In some cases, this can lead to cancer of the uterus. Endometrial cancer. Noncancerous growths (polyps) on the endometrium, the lining of the uterus, or the cervix. Uterine fibroids. These are noncancerous growths in or around the uterus muscle tissue that can cause heavy bleeding. Any type of postmenopausal bleeding, even if it appears to be a typical menstrual period, should be checked by your health care provider. Treatment will depend on the cause of the bleeding. Follow these instructions at home:  Pay attention to any changes in your symptoms. Let your health care provider know about them. Avoid using tampons and douches as told by your health care provider. Change your pads regularly. Get regular pelvic exams, including Pap tests, as told by your health care provider. Take iron supplements as told by your health care provider. Take over-the-counter and prescription medicines only as told by your health care provider. Keep all follow-up visits. This is important. Contact a health care provider if: You have new bleeding from the vagina after menopause. You have pain in your abdomen. Get help right away if: You have  a fever or chills. You have severe pain with bleeding. You are passing blood clots. You have heavy bleeding, need more than 1 pad an hour, and have never experienced this before. You have headaches or feel faint or dizzy. Summary Postmenopausal bleeding is any bleeding that a woman has after she has entered into menopause. Postmenopausal bleeding may have various causes. Treatment will depend on the cause of the bleeding. Any type of postmenopausal bleeding, even if it appears to be a typical menstrual period, should be checked by your health care provider. Be sure to pay attention to any changes in your symptoms and keep all follow-up visits. This information is not intended to replace advice given to you by your health care provider. Make sure you discuss any questions you have with your health care provider. Document Revised: 01/11/2020 Document Reviewed: 01/11/2020 Elsevier Patient Education  [place].

## 2022-09-28 LAB — CYTOLOGY - PAP
Adequacy: ABSENT
Comment: NEGATIVE
Diagnosis: NEGATIVE
High risk HPV: NEGATIVE

## 2022-10-04 ENCOUNTER — Other Ambulatory Visit: Payer: Self-pay | Admitting: Obstetrics and Gynecology

## 2022-10-05 NOTE — Telephone Encounter (Signed)
Last AEX 08/20/21 but other visits. Scheduled for gyn pelvic u/s and visit 11/03/2022

## 2022-10-06 ENCOUNTER — Other Ambulatory Visit: Payer: Self-pay | Admitting: Family

## 2022-10-06 ENCOUNTER — Encounter: Payer: Self-pay | Admitting: Family

## 2022-10-06 MED ORDER — VENLAFAXINE HCL ER 37.5 MG PO CP24: 37.5000 mg | ORAL_CAPSULE | Freq: Every day | ORAL | 3 refills | Status: DC

## 2022-10-21 NOTE — Progress Notes (Signed)
GYNECOLOGY  VISIT   HPI: 52 y.o.   Married  Caucasian  female   G1P0010 with Patient's last menstrual period was 10/05/2019 (exact date).   here for  pelvic US with endometrial biopsy for postmenopausal bleeding on HRT. Bled 1/30 and then 2/1.  None since.   No missed HRT doses.   GYNECOLOGIC HISTORY: Patient's last menstrual period was 10/05/2019 (exact date). Contraception:  PMP Menopausal hormone therapy:  estradiol Last mammogram:   11/20/21 Breast Density Category B, BI-RADS CATEGORY 1 Neg  Last pap smear:    11/12/20 neg: HR HPV neg         OB History     Gravida  1   Para  0   Term  0   Preterm  0   AB  1   Living  0      SAB  0   IAB  1   Ectopic  0   Multiple  0   Live Births  0              Patient Active Problem List   Diagnosis Date Noted   Positive ANA (antinuclear antibody) 03/17/2022   Other fatigue 03/17/2022   Asthma 09/25/2021   Weight loss counseling, encounter for 12/17/2017   Obesity 06/04/2017    Past Medical History:  Diagnosis Date   Abnormal Pap smear of cervix    Depression    Dysmenorrhea    Peptic ulcer    STD (sexually transmitted disease)    HSV    History reviewed. No pertinent surgical history.  Current Outpatient Medications  Medication Sig Dispense Refill   albuterol (VENTOLIN HFA) 108 (90 Base) MCG/ACT inhaler Inhale 2 puffs into the lungs every 6 (six) hours as needed for wheezing or shortness of breath. 8 g 3   Calcium Carb-Cholecalciferol (CALCIUM 1000 + D PO) Take by mouth.     Cholecalciferol (VITAMIN D3 PO) Take by mouth.     clindamycin (CLEOCIN T) 1 % external solution      COLLAGEN PO Take 1 tablet by mouth daily.     Cyanocobalamin (B-12 PO) Take by mouth.     estradiol (VIVELLE-DOT) 0.075 MG/24HR APPLY 1 PATCH TOPICALLY TO THE SKIN 2 TIMES A WEEK 8 patch 11   ibuprofen (ADVIL) 800 MG tablet Take 1 tablet (800 mg total) by mouth every 8 (eight) hours as needed. 30 tablet 0   pantoprazole  (PROTONIX) 40 MG tablet TAKE 1 TABLET(40 MG) BY MOUTH TWICE DAILY BEFORE A MEAL 180 tablet 1   progesterone (PROMETRIUM) 200 MG capsule TAKE 1 CAPSULE(200 MG) BY MOUTH AT BEDTIME. 30 capsule 11   valACYclovir (VALTREX) 500 MG tablet Take one tablet (500 mg) by mouth twice a day for 3 days as needed for an outbreak. 30 tablet 2   venlafaxine XR (EFFEXOR-XR) 37.5 MG 24 hr capsule Take 1 capsule (37.5 mg total) by mouth daily with breakfast. 90 capsule 3   No current facility-administered medications for this visit.     ALLERGIES: Monistat [miconazole]  Family History  Problem Relation Age of Onset   Hypertension Mother    Arthritis Mother        OA   Atrial fibrillation Mother        medication   Heart Problems Father        pace maker 2018   Prostate cancer Father    Atrial fibrillation Father    Breast cancer Neg Hx    Colon cancer Neg  Hx    Stomach cancer Neg Hx    Pancreatic cancer Neg Hx     Social History   Socioeconomic History   Marital status: Married    Spouse name: Not on file   Number of children: 0   Years of education: Not on file   Highest education level: Not on file  Occupational History    Employer: [employer]  Tobacco Use   Smoking status: Former    Packs/day: 0.25    Years: 0.00    Additional pack years: 0.00    Total pack years: 0.00    Types: Cigarettes    Quit date: 08/11/1991    Years since quitting: 31.2   Smokeless tobacco: Never   Tobacco comments:    Social smoker  Vaping Use   Vaping Use: Never used  Substance and Sexual Activity   Alcohol use: Yes    Alcohol/week: 2.0 standard drinks of alcohol    Types: 2 Standard drinks or equivalent per week   Drug use: No   Sexual activity: Yes    Partners: Male    Birth control/protection: Post-menopausal  Other Topics Concern   Not on file  Social History Narrative   Not on file   Social Determinants of Health   Financial Resource Strain: Low Risk  (04/15/2022)   Overall Financial  Resource Strain (CARDIA)    Difficulty of Paying Living Expenses: Not hard at all  Food Insecurity: No Food Insecurity (04/15/2022)   Hunger Vital Sign    Worried About Running Out of Food in the Last Year: Never true    [place] in the Last Year: Never true  Transportation Needs: No Transportation Needs (04/15/2022)   PRAPARE - Hydrologist (Medical): No    Lack of Transportation (Non-Medical): No  Physical Activity: Unknown (04/15/2022)   Exercise Vital Sign    Days of Exercise per Week: Patient declined    Minutes of Exercise per Session: Not on file  Stress: No Stress Concern Present (04/15/2022)   [place]    Feeling of Stress : Not at all  Social Connections: Unknown (04/15/2022)   Social Connection and Isolation Panel [NHANES]    Frequency of Communication with Friends and Family: Patient declined    Frequency of Social Gatherings with Friends and Family: Patient declined    Attends Religious Services: Patient declined    Marine scientist or Organizations: Patient declined    Attends Archivist Meetings: Not on file    Marital Status: Married  Human resources officer Violence: Not on file    Review of Systems  All other systems reviewed and are negative.   PHYSICAL EXAMINATION:    BP 116/80 (BP Location: Right Arm, Patient Position: Sitting, Cuff Size: Large)   Pulse 95   Ht 5\' 7"  (1.702 m)   Wt 234 lb (106.1 kg)   LMP 10/05/2019 (Exact Date) Comment: 09-30-2020 bleeding  SpO2 98%   BMI 36.65 kg/m       Gen:  NAD.   Pelvic US Uterus 7.8 x 4.21 x 3.73 cm.  No myometrial masses. EMS 2.94 mm.   No masses or thickening.  Left ovary 2.4 x 1.37 x 1.0 cm.  Right ovary 2.44 x 0.84 x 1.22 cm.  No adnexal masses.  No free fluid.  EMB: Consent for procedure. Sterile prep with   Paracervical block with 1% lidocaine 10 cc, lot number  [id number], expiration  02/2025 Tenaculum to anterior cervical lip. Pipelle passed to    almost 8 cm twice.   Tissue to pathology.  Minimal EBL. No complications.   Chaperone was present for exam:  Raquel Sarna  ASSESSMENT  Postmenopausal bleeding on HRT  PLAN  Pelvic US images and report reviewed.  FU EMB.  OK to continue current HRT at this time.   15  total time was spent for this patient encounter, including preparation, face-to-face counseling with the patient, coordination of care, and documentation of the encounter in addition to doing endometrial biopsy.

## 2022-11-03 ENCOUNTER — Ambulatory Visit (INDEPENDENT_AMBULATORY_CARE_PROVIDER_SITE_OTHER): Payer: BC Managed Care – PPO | Admitting: Obstetrics and Gynecology

## 2022-11-03 ENCOUNTER — Ambulatory Visit (INDEPENDENT_AMBULATORY_CARE_PROVIDER_SITE_OTHER): Payer: BC Managed Care – PPO

## 2022-11-03 ENCOUNTER — Encounter: Payer: Self-pay | Admitting: Obstetrics and Gynecology

## 2022-11-03 ENCOUNTER — Other Ambulatory Visit (HOSPITAL_COMMUNITY)
Admission: RE | Admit: 2022-11-03 | Discharge: 2022-11-03 | Disposition: A | Discharge status: Home / Self Care | Payer: BC Managed Care – PPO | Source: Ambulatory Visit | Attending: Obstetrics and Gynecology | Admitting: Obstetrics and Gynecology

## 2022-11-03 VITALS — BP 116/80 | HR 95 | Ht 67.0 in | Wt 234.0 lb

## 2022-11-03 DIAGNOSIS — Z79890 Hormone replacement therapy (postmenopausal): Secondary | ICD-10-CM

## 2022-11-03 DIAGNOSIS — N950 Postmenopausal bleeding: Secondary | ICD-10-CM | POA: Insufficient documentation

## 2022-11-03 NOTE — Addendum Note (Signed)
Addended by: Aundria Rud E on: 11/03/2022 05:16 PM   Modules accepted: Orders

## 2022-11-03 NOTE — Patient Instructions (Signed)
Endometrial Biopsy  An endometrial biopsy is a procedure to remove tissue samples from the endometrium, which is the lining of the uterus. The tissue that is removed can then be checked under a microscope for disease. This procedure is used to diagnose conditions such as endometrial cancer, endometrial tuberculosis, polyps, or other inflammatory conditions. This procedure may also be used to investigate uterine bleeding to determine where you are in your menstrual cycle or how your hormone levels are affecting the lining of the uterus. Tell a health care provider about: Any allergies you have. All medicines you are taking, including vitamins, herbs, eye drops, creams, and over-the-counter medicines. Any problems you or family members have had with anesthetic medicines. Any bleeding problems you have. Any surgeries you have had. Any medical conditions you have. Whether you are pregnant or may be pregnant. What are the risks? Your health care provider will talk with you about risks. These may include: Bleeding. Pelvic infection. Puncture of the wall of the uterus with the biopsy device (rare). Allergic reactions to medicines. What happens before the procedure? Keep a record of your menstrual cycles as told by your health care provider. You may need to schedule your procedure for a specific time in your cycle. Bring a sanitary pad in case you need to wear one after the procedure. Ask your health care provider about: Changing or stopping your regular medicines. These include any diabetes medicines or blood thinners you take. Taking medicines such as aspirin and ibuprofen. These medicines can thin your blood. Do not take these medicines unless your health care provider tells you to. Taking over-the-counter medicines, vitamins, herbs, and supplements. Plan to have someone take you home from the hospital or clinic. What happens during the procedure? You will lie on an exam table with your feet  and legs supported as in a pelvic exam. Your health care provider will insert an instrument into your vagina to see your cervix. Your cervix will be cleansed with an antiseptic solution. A medicine (local anesthetic) will be used to numb the cervix. A forceps instrument will be used to hold your cervix steady for the biopsy. A thin, rod-like instrument (uterine sound) will be inserted through your cervix to determine the length of your uterus and the location where the biopsy sample will be removed. A thin, flexible tube (catheter) will be inserted through your cervix and into the uterus. The catheter will be used to collect the biopsy sample from your endometrial tissue. The tube and instruments will be removed, and the tissue sample will be sent to a lab for examination. The procedure may vary among health care providers and hospitals. What happens after the procedure? Your blood pressure, heart rate, breathing rate, and blood oxygen level will be monitored until you leave the hospital or clinic. It is up to you to get the results of your procedure. Ask your health care provider, or the department that is doing the procedure, when your results will be ready. Summary An endometrial biopsy is a procedure to remove tissue samples from the endometrium, which is the lining of the uterus. This procedure is used to diagnose conditions such as endometrial cancer, endometrial tuberculosis, polyps, or other inflammatory conditions. It is up to you to get the results of your procedure. Ask your health care provider, or the department that is doing the procedure, when your results will be ready. This information is not intended to replace advice given to you by your health care provider. Make sure you   discuss any questions you have with your health care provider. Document Revised: 11/11/2021 Document Reviewed: 11/11/2021 Elsevier Patient Education  2023 Elsevier Inc.  

## 2022-11-05 LAB — SURGICAL PATHOLOGY

## 2022-11-11 ENCOUNTER — Encounter: Payer: Self-pay | Admitting: Obstetrics and Gynecology

## 2022-11-30 ENCOUNTER — Telehealth: Payer: Self-pay | Admitting: Obstetrics and Gynecology

## 2022-11-30 ENCOUNTER — Other Ambulatory Visit: Payer: Self-pay | Admitting: Obstetrics and Gynecology

## 2022-11-30 DIAGNOSIS — Z1231 Encounter for screening mammogram for malignant neoplasm of breast: Secondary | ICD-10-CM

## 2022-11-30 NOTE — Telephone Encounter (Signed)
Please schedule sonohysterogram and possible repeat endometrial biopsy for my patient.  Please explain the sonohysterogram to the patient.   She is having recurrent postmenopausal bleeding on hormone replacement therapy.   I want to check for a possible hidden polyp in the endometrial cavity.  See My Chart message from 11/30/22.

## 2022-11-30 NOTE — Telephone Encounter (Signed)
Dr. Silva -please review and advise.   

## 2022-12-02 DIAGNOSIS — R053 Chronic cough: Secondary | ICD-10-CM | POA: Diagnosis not present

## 2022-12-02 DIAGNOSIS — Z8709 Personal history of other diseases of the respiratory system: Secondary | ICD-10-CM | POA: Diagnosis not present

## 2022-12-02 DIAGNOSIS — R0981 Nasal congestion: Secondary | ICD-10-CM | POA: Diagnosis not present

## 2022-12-03 NOTE — Telephone Encounter (Signed)
Left message to call [facility] Triage, [phone], option 4.  

## 2022-12-03 NOTE — Telephone Encounter (Signed)
See telephone encounter dated 11/30/22.   Encounter closed.

## 2022-12-16 ENCOUNTER — Ambulatory Visit: Payer: BC Managed Care – PPO

## 2022-12-16 NOTE — Telephone Encounter (Signed)
LVMTCB

## 2022-12-21 DIAGNOSIS — D3131 Benign neoplasm of right choroid: Secondary | ICD-10-CM | POA: Diagnosis not present

## 2022-12-21 DIAGNOSIS — H5213 Myopia, bilateral: Secondary | ICD-10-CM | POA: Diagnosis not present

## 2023-01-07 ENCOUNTER — Ambulatory Visit
Admission: RE | Admit: 2023-01-07 | Discharge: 2023-01-07 | Disposition: A | Discharge status: Home / Self Care | Payer: BC Managed Care – PPO | Source: Ambulatory Visit | Attending: Obstetrics and Gynecology | Admitting: Obstetrics and Gynecology

## 2023-01-07 DIAGNOSIS — Z1231 Encounter for screening mammogram for malignant neoplasm of breast: Secondary | ICD-10-CM | POA: Diagnosis not present

## 2023-01-11 NOTE — Telephone Encounter (Signed)
Two calls made to pt w/ no success and no returned call. Would you like for [place] to mail letter? Please advise. Thanks.

## 2023-01-18 NOTE — Telephone Encounter (Signed)
Please send letter to patient recommending sonohysterogram and endometrial biopsy.

## 2023-01-20 NOTE — Telephone Encounter (Signed)
Letter typed and placed on Dr. Rica Records desk for authorization. Also added info for sonohyst to be mailed with letter.

## 2023-01-21 NOTE — Telephone Encounter (Signed)
Letter authorized and mailed. Will route and close encounter.

## 2023-01-28 DIAGNOSIS — D225 Melanocytic nevi of trunk: Secondary | ICD-10-CM | POA: Diagnosis not present

## 2023-01-28 DIAGNOSIS — S30860A Insect bite (nonvenomous) of lower back and pelvis, initial encounter: Secondary | ICD-10-CM | POA: Diagnosis not present

## 2023-01-28 DIAGNOSIS — L821 Other seborrheic keratosis: Secondary | ICD-10-CM | POA: Diagnosis not present

## 2023-01-28 DIAGNOSIS — L298 Other pruritus: Secondary | ICD-10-CM | POA: Diagnosis not present

## 2023-01-28 DIAGNOSIS — L820 Inflamed seborrheic keratosis: Secondary | ICD-10-CM | POA: Diagnosis not present

## 2023-01-28 DIAGNOSIS — L814 Other melanin hyperpigmentation: Secondary | ICD-10-CM | POA: Diagnosis not present

## 2023-01-28 DIAGNOSIS — D485 Neoplasm of uncertain behavior of skin: Secondary | ICD-10-CM | POA: Diagnosis not present

## 2023-01-28 DIAGNOSIS — D224 Melanocytic nevi of scalp and neck: Secondary | ICD-10-CM | POA: Diagnosis not present

## 2023-07-30 ENCOUNTER — Other Ambulatory Visit: Payer: Self-pay | Admitting: Obstetrics and Gynecology

## 2023-07-30 DIAGNOSIS — Z79890 Hormone replacement therapy (postmenopausal): Secondary | ICD-10-CM

## 2023-07-30 NOTE — Telephone Encounter (Signed)
Med refill request:estradiol patch Last AEX: 11/03/22 Next AEX: 11/01/23 Last MMG (if hormonal med) 01/07/23 Refill authorized: Please Advise, #8, 2 RF

## 2023-09-24 ENCOUNTER — Other Ambulatory Visit: Payer: Self-pay | Admitting: Obstetrics and Gynecology

## 2023-09-24 DIAGNOSIS — Z79890 Hormone replacement therapy (postmenopausal): Secondary | ICD-10-CM

## 2023-09-24 NOTE — Telephone Encounter (Signed)
Med refill request: progesterone 200 mg Last AEX: 08/24/22 Next AEX: 11/01/23 Last MMG (if hormonal med) 01/08/23 BI-RADS 1 negative Last refill: 08/23/2023  Refill authorized: progesterone 200 mg #30 with 1 refill.  Sent to provider for approval or denial as appropriate.

## 2023-10-21 ENCOUNTER — Other Ambulatory Visit: Payer: Self-pay | Admitting: Obstetrics and Gynecology

## 2023-10-21 DIAGNOSIS — Z79890 Hormone replacement therapy (postmenopausal): Secondary | ICD-10-CM

## 2023-10-21 NOTE — Telephone Encounter (Signed)
 Med refill request:estradiol patch Last AEX: 11/03/22 Next AEX: 11/09/23 Last MMG (if hormonal med) 01/07/23 Refill authorized: Please Advise, #8, 0 RF

## 2023-10-26 NOTE — Progress Notes (Signed)
 53 y.o. G89P0010 Married Caucasian female here for annual exam.    She is on HRT and wants to continue.  Her menopause symptoms are controlled. No further bleeding or spotting.  Had evaluation last year for postmenopausal bleeding had a pelvic US and EMB done.  Pelvic US 11/03/22: Uterus 7.8 x 4.21 x 3.73 cm.  No myometrial masses. EMS 2.94 mm.   No masses or thickening.  Left ovary 2.4 x 1.37 x 1.0 cm.  Right ovary 2.44 x 0.84 x 1.22 cm.  No adnexal masses.  No free fluid.  EMB 11/03/22 showed benign endocervical cells and no endometrial tissue.   She contacted the office back on 11/30/22 with recurrent bleeding.  Sonohysterogram was recommended and ultimately she declined as the bleeding stopped after that episode.  Lost weight since last visit.   Caring for her parents.   PCP: Olive Bass, FNP   Patient's last menstrual period was 10/05/2019 (exact date).           Sexually active: Yes.    The current method of family planning is post menopausal status.    Menopausal hormone therapy:  vivelle-dot, prometrium Exercising: Yes.     Treadmill, stairmaster, weight lifting, walking Smoker:  former  OB History  Gravida Para Term Preterm AB Living  1 0 0 0 1 0  SAB IAB Ectopic Multiple Live Births  0 1 0 0 0    # Outcome Date GA Lbr Len/2nd Weight Sex Type Anes PTL Lv  1 IAB              HEALTH MAINTENANCE: Last 2 paps:  09/22/22 neg: HR HPV neg, 11/12/20 neg: HR HPV neg History of abnormal Pap or positive HPV:  yes Mammogram:   01/07/23 Breast Density Cat A, BI-RADS CAT 1 neg Colonoscopy:  n/a Bone Density:  n/a  Result  n/a   Immunization History  Administered Date(s) Administered   PFIZER(Purple Top)SARS-COV-2 Vaccination 10/26/2019, 11/23/2019, 08/16/2020   Tdap 08/27/2011      reports that she quit smoking about 32 years ago. Her smoking use included cigarettes. She started smoking about 32 years ago. She has never used smokeless tobacco. She reports  current alcohol use of about 2.0 standard drinks of alcohol per week. She reports that she does not use drugs.  Past Medical History:  Diagnosis Date   Abnormal Pap smear of cervix    Depression    Dysmenorrhea    Peptic ulcer    STD (sexually transmitted disease)    HSV    History reviewed. No pertinent surgical history.  Current Outpatient Medications  Medication Sig Dispense Refill   Calcium Carb-Cholecalciferol (CALCIUM 1000 + D PO) Take by mouth.     Cholecalciferol (VITAMIN D3 PO) Take by mouth.     COLLAGEN PO Take 1 tablet by mouth daily.     Cyanocobalamin (B-12 PO) Take by mouth.     estradiol (VIVELLE-DOT) 0.075 MG/24HR APPLY 1 PATCH TOPICALLY TO THE SKIN 2 TIMES A WEEK 8 patch 0   pantoprazole (PROTONIX) 40 MG tablet TAKE 1 TABLET(40 MG) BY MOUTH TWICE DAILY BEFORE A MEAL 180 tablet 1   progesterone (PROMETRIUM) 200 MG capsule TAKE 1 CAPSULE(200 MG) BY MOUTH AT BEDTIME 30 capsule 1   valACYclovir (VALTREX) 500 MG tablet Take one tablet (500 mg) by mouth twice a day for 3 days as needed for an outbreak. 30 tablet 2   venlafaxine XR (EFFEXOR-XR) 37.5 MG 24 hr capsule TAKE 1  CAPSULE(37.5 MG) BY MOUTH DAILY WITH BREAKFAST 30 capsule 0   No current facility-administered medications for this visit.    ALLERGIES: Monistat [miconazole]  Family History  Problem Relation Age of Onset   Hypertension Mother    Arthritis Mother        OA   Atrial fibrillation Mother        medication   Heart Problems Father        pace maker 2018   Prostate cancer Father    Atrial fibrillation Father    Breast cancer Neg Hx    Colon cancer Neg Hx    Stomach cancer Neg Hx    Pancreatic cancer Neg Hx     Review of Systems  All other systems reviewed and are negative.   PHYSICAL EXAM:  BP 126/80 (BP Location: Left Arm, Patient Position: Sitting, Cuff Size: Small)   Pulse 73   Ht 5' 7.5" (1.715 m)   Wt 190 lb (86.2 kg)   LMP 10/05/2019 (Exact Date) Comment: 09-30-2020 bleeding   SpO2 98%   BMI 29.32 kg/m     General appearance: alert, cooperative and appears stated age Head: normocephalic, without obvious abnormality, atraumatic Neck: no adenopathy, supple, symmetrical, trachea midline and thyroid normal to inspection and palpation Lungs: clear to auscultation bilaterally Breasts: normal appearance, no masses or tenderness, No nipple retraction or dimpling, No nipple discharge or bleeding, No axillary adenopathy Heart: regular rate and rhythm Abdomen: soft, non-tender; no masses, no organomegaly Extremities: extremities normal, atraumatic, no cyanosis or edema Skin: skin color, texture, turgor normal. No rashes or lesions Lymph nodes: cervical, supraclavicular, and axillary nodes normal. Neurologic: grossly normal  Pelvic: External genitalia:  no lesions              No abnormal inguinal nodes palpated.              Urethra:  normal appearing urethra with no masses, tenderness or lesions              Bartholins and Skenes: normal                 Vagina: normal appearing vagina with normal color and discharge, no lesions              Cervix: no lesions              Pap taken: No. Bimanual Exam:  Uterus:  normal size, contour, position, consistency, mobility, non-tender              Adnexa: no mass, fullness, tenderness              Rectal exam: Yes.  .  Confirms.              Anus:  normal sphincter tone, no lesions  Chaperone was present for exam:  Warren Lacy, CMA  ASSESSMENT: Well woman visit with gynecologic exam. HRT.  HSV.  On Valtrex prn.  Depression.  On Effexor.  Hx rectal nodule.  Colon cancer screening.  PHQ-9: 0  PLAN: Mammogram screening discussed. Self breast awareness reviewed. Pap and HRV collected:  No.  Due in 2029.  Guidelines for Calcium, Vitamin D, regular exercise program including cardiovascular and weight bearing exercise. Discused WHI and use of HRT which can increase risk of PE, DVT, MI, stroke and breast cancer.  Medication  refills:  refill of Vivelle Dot, Prometrium and Valtrex.   Cologuard ordered.  Follow up:  yearly and prn.

## 2023-11-01 ENCOUNTER — Ambulatory Visit: Payer: BC Managed Care – PPO | Admitting: Obstetrics and Gynecology

## 2023-11-06 ENCOUNTER — Other Ambulatory Visit: Payer: Self-pay | Admitting: Family

## 2023-11-09 ENCOUNTER — Ambulatory Visit (INDEPENDENT_AMBULATORY_CARE_PROVIDER_SITE_OTHER): Admitting: Obstetrics and Gynecology

## 2023-11-09 ENCOUNTER — Encounter: Payer: Self-pay | Admitting: Obstetrics and Gynecology

## 2023-11-09 VITALS — BP 126/80 | HR 73 | Ht 67.5 in | Wt 190.0 lb

## 2023-11-09 DIAGNOSIS — Z1331 Encounter for screening for depression: Secondary | ICD-10-CM

## 2023-11-09 DIAGNOSIS — Z8619 Personal history of other infectious and parasitic diseases: Secondary | ICD-10-CM

## 2023-11-09 DIAGNOSIS — Z01419 Encounter for gynecological examination (general) (routine) without abnormal findings: Secondary | ICD-10-CM

## 2023-11-09 DIAGNOSIS — Z1211 Encounter for screening for malignant neoplasm of colon: Secondary | ICD-10-CM

## 2023-11-09 DIAGNOSIS — Z79890 Hormone replacement therapy (postmenopausal): Secondary | ICD-10-CM | POA: Diagnosis not present

## 2023-11-09 MED ORDER — PROGESTERONE 200 MG PO CAPS
ORAL_CAPSULE | ORAL | 3 refills | Status: AC
Start: 2023-11-09 — End: ?

## 2023-11-09 MED ORDER — ESTRADIOL 0.075 MG/24HR TD PTTW
MEDICATED_PATCH | TRANSDERMAL | 3 refills | Status: DC
Start: 2023-11-09 — End: 2024-03-10

## 2023-11-09 MED ORDER — VALACYCLOVIR HCL 500 MG PO TABS
ORAL_TABLET | ORAL | 2 refills | Status: AC
Start: 2023-11-09 — End: ?

## 2023-11-09 NOTE — Patient Instructions (Signed)

## 2023-12-09 ENCOUNTER — Other Ambulatory Visit: Payer: Self-pay | Admitting: Obstetrics and Gynecology

## 2023-12-09 DIAGNOSIS — Z1231 Encounter for screening mammogram for malignant neoplasm of breast: Secondary | ICD-10-CM

## 2024-01-10 ENCOUNTER — Ambulatory Visit

## 2024-01-24 ENCOUNTER — Ambulatory Visit
Admission: RE | Admit: 2024-01-24 | Discharge: 2024-01-24 | Disposition: A | Discharge status: Home / Self Care | Source: Ambulatory Visit | Attending: Obstetrics and Gynecology | Admitting: Obstetrics and Gynecology

## 2024-01-24 DIAGNOSIS — Z1231 Encounter for screening mammogram for malignant neoplasm of breast: Secondary | ICD-10-CM | POA: Diagnosis not present

## 2024-01-26 ENCOUNTER — Ambulatory Visit: Payer: Self-pay | Admitting: Obstetrics and Gynecology

## 2024-01-27 ENCOUNTER — Encounter: Payer: Self-pay | Admitting: Family

## 2024-02-10 ENCOUNTER — Telehealth: Payer: Self-pay | Admitting: Obstetrics and Gynecology

## 2024-02-10 NOTE — Telephone Encounter (Signed)
 Patient notified

## 2024-02-10 NOTE — Telephone Encounter (Signed)
 Please remind patient that her Cologuard is due.  She came up in my reminder box in Epic.

## 2024-03-10 ENCOUNTER — Other Ambulatory Visit: Payer: Self-pay

## 2024-03-10 DIAGNOSIS — Z79890 Hormone replacement therapy (postmenopausal): Secondary | ICD-10-CM

## 2024-03-10 MED ORDER — ESTRADIOL 0.075 MG/24HR TD PTTW: MEDICATED_PATCH | TRANSDERMAL | 2 refills | Status: DC

## 2024-03-10 NOTE — Telephone Encounter (Signed)
.  Med refill request: Estradiol  0.075 MG patch  Last AEX:11/09/23 Next AEX:11/09/24 Last MMG (if hormonal med)01/26/24- BI-Rads Cat. 1 Neg. Refill authorized: Please Advise?

## 2024-04-27 ENCOUNTER — Telehealth

## 2024-06-19 DIAGNOSIS — D3131 Benign neoplasm of right choroid: Secondary | ICD-10-CM | POA: Diagnosis not present

## 2024-08-24 ENCOUNTER — Other Ambulatory Visit: Payer: Self-pay | Admitting: Obstetrics and Gynecology

## 2024-08-24 ENCOUNTER — Encounter: Payer: Self-pay | Admitting: Obstetrics and Gynecology

## 2024-08-24 DIAGNOSIS — Z79890 Hormone replacement therapy (postmenopausal): Secondary | ICD-10-CM

## 2024-08-24 NOTE — Telephone Encounter (Signed)
 Med refill request: Vivelle   Last AEX: 11/09/23  Next AEX: 11/09/24 Last MMG (if hormonal med) 01/24/24 BIRADS Cat 1 neg  Refill authorized: Last rx 03/10/24 #24 with 2 refills. Please Advise?

## 2024-08-24 NOTE — Telephone Encounter (Signed)
 See refill encounter dated 08/24/24  Encounter closed.

## 2024-09-03 ENCOUNTER — Other Ambulatory Visit: Payer: Self-pay | Admitting: Obstetrics and Gynecology

## 2024-09-03 DIAGNOSIS — Z79890 Hormone replacement therapy (postmenopausal): Secondary | ICD-10-CM

## 2024-09-05 NOTE — Telephone Encounter (Signed)
 Med refill request:   estradiol  (VIVELLE -DOT) 0.075 MG/24HR  Start:  08/24/24 Disp:  24 patch Refills:  0  Last AEX:  11/09/23 Next AEX:  11/09/24 Last MMG (if hormonal med):  01/24/24 Refill authorized? Please Advise.

## 2024-11-09 ENCOUNTER — Ambulatory Visit: Admitting: Obstetrics and Gynecology
